# Patient Record
Sex: Female | Born: 1985 | Race: Black or African American | Hispanic: No | Marital: Single | Smoking: Never smoker
Health system: Southern US, Community
[De-identification: ages and names within clinical notes are randomized; demographics above are authoritative.]

## PROBLEM LIST (undated history)

## (undated) DIAGNOSIS — F32A Depression, unspecified: Secondary | ICD-10-CM

## (undated) DIAGNOSIS — F329 Major depressive disorder, single episode, unspecified: Secondary | ICD-10-CM

## (undated) DIAGNOSIS — J45909 Unspecified asthma, uncomplicated: Secondary | ICD-10-CM

## (undated) HISTORY — DX: Unspecified asthma, uncomplicated: J45.909

## (undated) HISTORY — DX: Depression, unspecified: F32.A

## (undated) HISTORY — DX: Major depressive disorder, single episode, unspecified: F32.9

---

## 2005-02-04 ENCOUNTER — Inpatient Hospital Stay (HOSPITAL_COMMUNITY): Admission: AD | Admit: 2005-02-04 | Discharge: 2005-02-04 | Payer: Self-pay | Admitting: Obstetrics & Gynecology

## 2005-02-06 ENCOUNTER — Inpatient Hospital Stay (HOSPITAL_COMMUNITY): Admission: AD | Admit: 2005-02-06 | Discharge: 2005-02-06 | Payer: Self-pay | Admitting: *Deleted

## 2005-09-14 ENCOUNTER — Inpatient Hospital Stay (HOSPITAL_COMMUNITY): Admission: AD | Admit: 2005-09-14 | Discharge: 2005-09-14 | Payer: Self-pay | Admitting: Family Medicine

## 2005-10-10 ENCOUNTER — Inpatient Hospital Stay (HOSPITAL_COMMUNITY): Admission: AD | Admit: 2005-10-10 | Discharge: 2005-10-10 | Payer: Self-pay | Admitting: Obstetrics & Gynecology

## 2005-10-16 ENCOUNTER — Ambulatory Visit (HOSPITAL_COMMUNITY): Admission: RE | Admit: 2005-10-16 | Discharge: 2005-10-16 | Payer: Self-pay | Admitting: Family Medicine

## 2005-11-13 ENCOUNTER — Ambulatory Visit (HOSPITAL_COMMUNITY): Admission: RE | Admit: 2005-11-13 | Discharge: 2005-11-13 | Payer: Self-pay | Admitting: Obstetrics and Gynecology

## 2005-12-04 ENCOUNTER — Inpatient Hospital Stay (HOSPITAL_COMMUNITY): Admission: AD | Admit: 2005-12-04 | Discharge: 2005-12-04 | Payer: Self-pay | Admitting: Family Medicine

## 2005-12-05 ENCOUNTER — Ambulatory Visit: Payer: Self-pay | Admitting: Gynecology

## 2005-12-05 ENCOUNTER — Inpatient Hospital Stay (HOSPITAL_COMMUNITY): Admission: AD | Admit: 2005-12-05 | Discharge: 2005-12-06 | Payer: Self-pay | Admitting: Gynecology

## 2006-01-04 ENCOUNTER — Ambulatory Visit (HOSPITAL_COMMUNITY): Admission: RE | Admit: 2006-01-04 | Discharge: 2006-01-04 | Payer: Self-pay | Admitting: Obstetrics & Gynecology

## 2006-03-19 ENCOUNTER — Inpatient Hospital Stay (HOSPITAL_COMMUNITY): Admission: AD | Admit: 2006-03-19 | Discharge: 2006-03-19 | Payer: Self-pay | Admitting: Obstetrics & Gynecology

## 2006-04-17 ENCOUNTER — Inpatient Hospital Stay (HOSPITAL_COMMUNITY): Admission: AD | Admit: 2006-04-17 | Discharge: 2006-04-19 | Payer: Self-pay | Admitting: Obstetrics and Gynecology

## 2012-04-23 DIAGNOSIS — Z87898 Personal history of other specified conditions: Secondary | ICD-10-CM | POA: Insufficient documentation

## 2013-01-01 HISTORY — PX: PARTIAL HYSTERECTOMY: SHX80

## 2017-01-19 DIAGNOSIS — R509 Fever, unspecified: Secondary | ICD-10-CM | POA: Diagnosis not present

## 2017-01-19 DIAGNOSIS — J329 Chronic sinusitis, unspecified: Secondary | ICD-10-CM | POA: Diagnosis not present

## 2017-01-19 DIAGNOSIS — J029 Acute pharyngitis, unspecified: Secondary | ICD-10-CM | POA: Diagnosis not present

## 2017-06-08 DIAGNOSIS — S00511A Abrasion of lip, initial encounter: Secondary | ICD-10-CM | POA: Diagnosis not present

## 2017-06-08 DIAGNOSIS — S0993XA Unspecified injury of face, initial encounter: Secondary | ICD-10-CM | POA: Diagnosis not present

## 2017-06-08 DIAGNOSIS — S0990XA Unspecified injury of head, initial encounter: Secondary | ICD-10-CM | POA: Diagnosis not present

## 2017-06-08 DIAGNOSIS — S0081XA Abrasion of other part of head, initial encounter: Secondary | ICD-10-CM | POA: Diagnosis not present

## 2017-06-08 DIAGNOSIS — F10129 Alcohol abuse with intoxication, unspecified: Secondary | ICD-10-CM | POA: Diagnosis not present

## 2017-06-08 DIAGNOSIS — T07XXXA Unspecified multiple injuries, initial encounter: Secondary | ICD-10-CM | POA: Diagnosis not present

## 2017-06-09 DIAGNOSIS — T148XXA Other injury of unspecified body region, initial encounter: Secondary | ICD-10-CM | POA: Diagnosis not present

## 2017-06-14 ENCOUNTER — Ambulatory Visit (INDEPENDENT_AMBULATORY_CARE_PROVIDER_SITE_OTHER): Payer: 59 | Admitting: Family Medicine

## 2017-06-14 ENCOUNTER — Encounter: Payer: Self-pay | Admitting: Family Medicine

## 2017-06-14 VITALS — BP 96/66 | HR 84 | Temp 98.5°F | Ht 67.0 in | Wt 185.6 lb

## 2017-06-14 DIAGNOSIS — Z0000 Encounter for general adult medical examination without abnormal findings: Secondary | ICD-10-CM

## 2017-06-14 DIAGNOSIS — R1013 Epigastric pain: Secondary | ICD-10-CM | POA: Diagnosis not present

## 2017-06-14 DIAGNOSIS — Z114 Encounter for screening for human immunodeficiency virus [HIV]: Secondary | ICD-10-CM | POA: Diagnosis not present

## 2017-06-14 DIAGNOSIS — Z23 Encounter for immunization: Secondary | ICD-10-CM | POA: Diagnosis not present

## 2017-06-14 LAB — COMPREHENSIVE METABOLIC PANEL
ALBUMIN: 4.4 g/dL (ref 3.5–5.2)
ALT: 9 U/L (ref 0–35)
AST: 14 U/L (ref 0–37)
Alkaline Phosphatase: 58 U/L (ref 39–117)
BUN: 9 mg/dL (ref 6–23)
CHLORIDE: 101 meq/L (ref 96–112)
CO2: 28 mEq/L (ref 19–32)
CREATININE: 1.07 mg/dL (ref 0.40–1.20)
Calcium: 9.8 mg/dL (ref 8.4–10.5)
GFR: 75.99 mL/min (ref >60.00)
GLUCOSE: 96 mg/dL (ref 70–99)
POTASSIUM: 4 meq/L (ref 3.5–5.1)
SODIUM: 137 meq/L (ref 135–145)
TOTAL PROTEIN: 8.1 g/dL (ref 6.0–8.3)
Total Bilirubin: 0.6 mg/dL (ref 0.2–1.2)

## 2017-06-14 LAB — CBC WITH DIFFERENTIAL/PLATELET
BASOS PCT: 0.7 % (ref 0.0–3.0)
Basophils Absolute: 0.1 10*3/uL (ref 0.0–0.1)
EOS ABS: 0.2 10*3/uL (ref 0.0–0.7)
EOS PCT: 2 % (ref 0.0–5.0)
HCT: 39.9 % (ref 36.0–46.0)
HEMOGLOBIN: 13.4 g/dL (ref 12.0–15.0)
Lymphocytes Relative: 21.7 % (ref 12.0–46.0)
Lymphs Abs: 2.2 10*3/uL (ref 0.7–4.0)
MCHC: 33.6 g/dL (ref 30.0–36.0)
MCV: 89.1 fl (ref 78.0–100.0)
MONO ABS: 0.6 10*3/uL (ref 0.1–1.0)
Monocytes Relative: 6.3 % (ref 3.0–12.0)
NEUTROS ABS: 6.9 10*3/uL (ref 1.4–7.7)
Neutrophils Relative %: 69.3 % (ref 43.0–77.0)
PLATELETS: 309 10*3/uL (ref 150.0–400.0)
RBC: 4.47 Mil/uL (ref 3.87–5.11)
RDW: 14.4 % (ref 11.5–15.5)
WBC: 10 10*3/uL (ref 4.0–10.5)

## 2017-06-14 LAB — LIPID PANEL
CHOL/HDL RATIO: 3
CHOLESTEROL: 176 mg/dL (ref 0–200)
HDL: 52.5 mg/dL (ref >39.00)
LDL CALC: 104 mg/dL — AB (ref 0–99)
NonHDL: 123.07
TRIGLYCERIDES: 94 mg/dL (ref 0.0–149.0)
VLDL: 18.8 mg/dL (ref 0.0–40.0)

## 2017-06-14 LAB — LIPASE: Lipase: 22 U/L (ref 11.0–59.0)

## 2017-06-14 LAB — TSH: TSH: 1.39 u[IU]/mL (ref 0.35–4.50)

## 2017-06-14 MED ORDER — PANTOPRAZOLE SODIUM 40 MG PO TBEC: 40.0000 mg | DELAYED_RELEASE_TABLET | Freq: Every day | ORAL | 3 refills | Status: DC
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
## 2017-06-14 NOTE — Patient Instructions (Signed)
Gastritis, Adult Gastritis is swelling (inflammation) of the stomach. When you have this condition, you can have these problems (symptoms):  Pain in your stomach.  A burning feeling in your stomach.  Feeling sick to your stomach (nauseous).  Throwing up (vomiting).  Feeling too full after you eat.  It is important to get help for this condition. Without help, your stomach can bleed, and you can get sores (ulcers) in your stomach. Follow these instructions at home:  Take over-the-counter and prescription medicines only as told by your doctor.  If you were prescribed an antibiotic medicine, take it as told by your doctor. Do not stop taking it even if you start to feel better.  Drink enough fluid to keep your pee (urine) clear or pale yellow.  Instead of eating big meals, eat small meals often. Contact a health care provider if:  Your problems get worse.  Your problems go away and then come back. Get help right away if:  You throw up blood or something that looks like coffee grounds.  You have black or dark red poop (stools).  You cannot keep fluids down.  Your stomach pain gets worse.  You have a fever.  You do not feel better after 1 week. This information is not intended to replace advice given to you by your health care provider. Make sure you discuss any questions you have with your health care provider. Document Released: 06/06/2007 Document Revised: 08/17/2015 Document Reviewed: 09/11/2014 Elsevier Interactive Patient Education  2018 [employer]. Mallory-Weiss Syndrome Mallory-Weiss syndrome refers to bleeding from tears in the lining of the tube that connects your throat to your stomach (esophagus). The tears occur at the entrance to your stomach. Usually the bleeding stops by itself after 24-48 hours. Surgery may be needed. This condition is not usually fatal. What are the causes? The tears that cause bleeding are often caused by severe or lasting vomiting or  coughing. What increases the risk?  Abusing or drinking too much alcohol.  Having certain eating disorders, such as bulimia. What are the signs or symptoms?  Vomiting of bright red or black coffee-ground-like material.  Having black, tarry stools.  Fainting or experiencing loss of consciousness. How is this diagnosed? The procedure often used to diagnose Mallory-Weiss syndrome is an esophagogastroduodenoscopy (EGD). During an EGD procedure, a small, flexible, tube-like telescope (endoscope) is put into your mouth, passed through your esophagus into your stomach, and then into your small bowel. An EGD will show your health care provider where the tear is. How is this treated? It is necessary to stop the bleeding as soon as possible. The possible treatments to stop the bleeding include injecting medicine into bleeding areas to block (clot) the blood vessels. If bleeding is significant, it may be necessary to replace the blood lost with a blood transfusion. Follow these instructions at home: Treat any conditions that may be causing the lasting or recurrent vomiting or coughing. This may include getting help for alcoholism or an eating disorder, if this applies. Contact a health care provider if: You have nausea or vomiting. Get help right away if:  You have persistent dizziness, light-headedness, or fainting.  Your vomiting returns, or you have vomit that is bright red or looks like black coffee grounds.  You have bloody or black, tarry-looking stools.  You have chest pain.  You cannot eat or drink. This information is not intended to replace advice given to you by your health care provider. Make sure you discuss any questions  you have with your health care provider. Document Released: 05/07/2005 Document Revised: 05/26/2015 Document Reviewed: 02/11/2013 Elsevier Interactive Patient Education  [employer]2018 Elsevier Inc.

## 2017-06-15 LAB — HIV ANTIBODY (ROUTINE TESTING W REFLEX): HIV 1&2 Ab, 4th Generation: NONREACTIVE

## 2017-06-17 LAB — H. PYLORI BREATH TEST: H. pylori Breath Test: NOT DETECTED

## 2017-07-18 ENCOUNTER — Ambulatory Visit: Payer: 59 | Admitting: Family Medicine

## 2017-07-22 NOTE — Progress Notes (Signed)
Patient: Morgan Oneal MRN: [id number] DOB: Dec 17, 1985 PCP: Morgan Oneal, Morgan Mettler, MD     Subjective:  Chief Complaint  Patient presents with  . Follow-up    abdominal pain improving    HPI: The patient is a 32 y.o. female who presents today for follow up abdominal pain-improving. I saw her on 06/14/2017 for abdominal pain and treated her for likely mallory wise tear. I put her on protonix daily. She states she is 100% better and no longer takes the protonix. She will occasionally take it if she eats really spicy foods, other than that she is off and feeling better. NO more n/v. Overall 100% improved.   Review of Systems  Constitutional: Positive for fatigue. Negative for activity change and appetite change.  Respiratory: Negative for shortness of breath.   Cardiovascular: Negative for chest pain.  Gastrointestinal: Negative for abdominal pain and nausea.  Neurological: Positive for headaches. Negative for dizziness.  Psychiatric/Behavioral: The patient is not nervous/anxious.     Allergies Patient is allergic to mucinex [guaifenesin er] and penicillins.  Past Medical History Patient  has a past medical history of Asthma and Depression.  Surgical History Patient  has a past surgical history that includes Partial hysterectomy (2015).  Family History Pateint's family history includes Alcohol abuse in her paternal grandfather; Arthritis in her father, maternal grandmother, and paternal grandmother; Asthma in her paternal grandmother; Cancer in her paternal grandmother; Diabetes in her maternal grandmother.  Social History Patient  reports that she has never smoked. She has never used smokeless tobacco. She reports that she drinks alcohol. She reports that she does not use drugs.    Objective: Vitals:   07/24/17 1101  BP: 104/64  Pulse: 80  Temp: 98.3 F (36.8 C)  TempSrc: Oral  SpO2: 98%  Weight: 186 lb 9.6 oz (84.6 kg)  Height: 5\' 7"  (1.702 m)    Body mass index is 29.23  kg/m.  Physical Exam  Constitutional: She is oriented to person, place, and time. She appears well-developed and well-nourished.  Cardiovascular: Normal rate, regular rhythm, normal heart sounds and intact distal pulses.  Pulmonary/Chest: Effort normal and breath sounds normal.  Abdominal: Soft. Bowel sounds are normal. She exhibits no distension. There is no tenderness.  Neurological: She is alert and oriented to person, place, and time.  Vitals reviewed.      Assessment/plan: 1. Mallory-Weiss tear Doing great and 100% improved. Pretty much off protonix. F/u as needed and she will come back for pap smear.    Return if symptoms worsen or fail to improve, for pap smear .     Morgan MustardAllison Melessa Cowell, MD [facility]   07/24/2017

## 2017-07-24 ENCOUNTER — Encounter: Payer: Self-pay | Admitting: Family Medicine

## 2017-07-24 ENCOUNTER — Ambulatory Visit (INDEPENDENT_AMBULATORY_CARE_PROVIDER_SITE_OTHER): Payer: 59 | Admitting: Family Medicine

## 2017-07-24 VITALS — BP 104/64 | HR 80 | Temp 98.3°F | Ht 67.0 in | Wt 186.6 lb

## 2017-07-24 DIAGNOSIS — K226 Gastro-esophageal laceration-hemorrhage syndrome: Secondary | ICD-10-CM | POA: Diagnosis not present

## 2017-07-24 DIAGNOSIS — I7300 Raynaud's syndrome without gangrene: Secondary | ICD-10-CM | POA: Insufficient documentation

## 2017-08-01 ENCOUNTER — Other Ambulatory Visit (HOSPITAL_COMMUNITY)
Admission: RE | Admit: 2017-08-01 | Discharge: 2017-08-01 | Disposition: A | Discharge status: Home / Self Care | Payer: 59 | Source: Ambulatory Visit | Attending: Family Medicine | Admitting: Family Medicine

## 2017-08-01 ENCOUNTER — Encounter: Payer: Self-pay | Admitting: Family Medicine

## 2017-08-01 ENCOUNTER — Ambulatory Visit (INDEPENDENT_AMBULATORY_CARE_PROVIDER_SITE_OTHER): Payer: 59 | Admitting: Family Medicine

## 2017-08-01 VITALS — BP 126/72 | HR 84 | Temp 98.7°F | Ht 67.0 in | Wt 185.6 lb

## 2017-08-01 DIAGNOSIS — Z01411 Encounter for gynecological examination (general) (routine) with abnormal findings: Secondary | ICD-10-CM | POA: Diagnosis not present

## 2017-08-01 DIAGNOSIS — Z01419 Encounter for gynecological examination (general) (routine) without abnormal findings: Secondary | ICD-10-CM

## 2017-08-01 DIAGNOSIS — N3941 Urge incontinence: Secondary | ICD-10-CM | POA: Diagnosis not present

## 2017-08-01 MED ORDER — MIRABEGRON ER 25 MG PO TB24: 25.0000 mg | ORAL_TABLET | Freq: Every day | ORAL | 1 refills | Status: DC

## 2017-08-01 NOTE — Progress Notes (Signed)
Subjective:    Morgan Oneal is a 32 y.o. female and is here for a well woman exam and pap smear.   Pertinent Gynecological History: No LMP recorded (lmp unknown). Patient has had a hysterectomy. Sexually active: Yes with males. Uses condoms.  Menses: hysterectomy Bleeding: spotting randomly  Contraception: status post hysterectomy DES exposure: unknown Blood transfusions: none Sexually transmitted diseases: no past history Previous GYN Procedures: multiple cones then hysterectomy   Last mammogram: n/a  Last pap: abnormal: 3 years ago  HPV vaccines: no   Menses age: 32 years of age. No breast cancer hx in mom/sisters. G4P3. All vaginal births. No pain with sex, no vaginal itching. She does have discharge all the time. It has no odor to it. She does have slight incontinence. More urge related. No breast complaints. She does use condoms with sex .  OB History   None     Health Maintenance Due  Topic Date Due  . PAP SMEAR  07/16/2005  . INFLUENZA VACCINE  08/01/2017    PMHx, SurgHx, SocialHx, Medications, and Allergies were reviewed in the Visit Navigator and updated as appropriate.   Past Medical History:  Diagnosis Date  . Asthma   . Depression   ER Past Surgical History:  Procedure Laterality Date  . PARTIAL HYSTERECTOMY  2015  ECTOMY  2015    Problem Relation Age of Onset  . Arthritis Father   . Arthritis Maternal Grandmother   . Diabetes Maternal Grandmother   . Arthritis Paternal Grandmother   . Asthma Paternal Grandmother   . Cancer Paternal Grandmother   . Alcohol abuse Paternal Grandfather    Social History   Tobacco Use  . Smoking status: Never Smoker  . Smokeless tobacco: Never Used  Substance Use Topics  . Alcohol use: Yes    Comment: maybe once/week   . Drug use: Never    Review of Systems:   Review of Systems  Constitutional: Negative for chills, fever and malaise/fatigue.  HENT: Negative for hearing loss and sore throat.   Eyes:  Negative for blurred vision and double vision.  Respiratory: Negative for cough, shortness of breath and wheezing.   Cardiovascular: Negative for chest pain, palpitations and leg swelling.  Gastrointestinal: Negative for abdominal pain, blood in stool, nausea and vomiting.  Genitourinary: Positive for urgency. Negative for dysuria and hematuria.  Musculoskeletal: Negative for falls.  Skin: Negative for rash.  Neurological: Negative for dizziness and weakness. Loss of consciousness: .diag.  Psychiatric/Behavioral: Negative for memory loss and suicidal ideas. The patient is not nervous/anxious and does not have insomnia.       Objective:   BP 126/72 (BP Location: Left Arm, Patient Position: Sitting, Cuff Size: Normal)   Pulse 84   Temp 98.7 F (37.1 C) (Oral)   Ht 5\' 7"  (1.702 m)   Wt 185 lb 9.6 oz (84.2 kg)   LMP  (LMP Unknown)   SpO2 98%   BMI 29.07 kg/m    Wt Readings from Last 3 Encounters:  08/01/17 185 lb 9.6 oz (84.2 kg)  07/24/17 186 lb 9.6 oz (84.6 kg)  06/14/17 185 lb 9.6 oz (84.2 kg)     Ht Readings from Last 3 Encounters:  08/01/17 5\' 7"  (1.702 m)  07/24/17 5\' 7"  (1.702 m)  06/14/17 5\' 7"  (1.702 m)    General appearance: alert, cooperative and appears stated age. Head: normocephalic, without obvious abnormality, atraumatic. Neck: no adenopathy, supple, symmetrical, trachea midline; thyroid not enlarged, symmetric, no tenderness/mass/nodules. Lungs:  clear to auscultation bilaterally. Breasts: inspection negative, no nipple retraction or dimpling, no nipple discharge or bleeding, no axillary or supraclavicular adenopathy, normal to palpation without dominant masses. Heart: regular rate and rhythm Abdomen: soft, non-tender; no masses,  no organomegaly. Extremities: extremities normal, atraumatic, no cyanosis or edema. Skin: skin color, texture, turgor normal, no rashes or lesions. Lymph: cervical, supraclavicular, and axillary nodes normal; no abnormal inguinal  nodes palpated. Neurologic: grossly normal.  Pelvic:  External genitalia: no lesions.              Urethra: normal appearing urethra with no masses, tenderness or lesions.              Bartholins and Skenes: normal.               Vagina: normal appearing vagina with normal color and discharge, no lesions.              Cervix: absent. Vaginal cuff.               Pap and high risk HPV testing done: Yes.  .        Bimanual Exam:                                         Adnexa: normal adnexa in size, nontender and no masses.                                      Anus: normal sphincter tone, no lesions.   Assessment/Plan:   1. Encounter for well woman exam with routine gynecological exam Pap and cytology for gc/BV/yeast. Will call insurance to see about getting HPV vaccines. Already  Had annual with labs/physical. Continue practicing safe sex.  - Cytology - PAP  Patient Counseling: [x]    Nutrition: Stressed importance of moderation in sodium/caffeine intake, saturated fat and cholesterol, caloric balance, sufficient intake of fresh fruits, vegetables, fiber, calcium, iron, and 1 mg of folate supplement per day (for females capable of pregnancy).  [x]    Stressed the importance of regular exercise.   [x]    Substance Abuse: Discussed cessation/primary prevention of tobacco, alcohol, or other drug use; driving or other dangerous activities under the influence; availability of treatment for abuse.   [x]    Injury prevention: Discussed safety belts, safety helmets, smoke detector, smoking near bedding or upholstery.   [x]    Sexuality: Discussed sexually transmitted diseases, partner selection, use of condoms, avoidance of unintended pregnancy  and contraceptive alternatives.  [x]    Dental health: Discussed importance of regular tooth brushing, flossing, and dental visits.  [x]    Health maintenance and immunizations reviewed. Please refer to Health maintenance section.   2. Urge incontinence Trial of  myrebetriq. Will let me know if too expensive. Discussed takes a month to work and can increase dose if we need to. Already does not drink caffeine and practices kegles and bladder training. Hopefully addition of medication will help her.   [name], MD  [facility]

## 2017-08-01 NOTE — Patient Instructions (Signed)
Urinary Incontinence Urinary incontinence is the involuntary loss of urine from your bladder. What are the causes? There are many causes of urinary incontinence. They include:  Medicines.  Infections.  Prostatic enlargement, leading to overflow of urine from your bladder.  Surgery.  Neurological diseases.  Emotional factors.  What are the signs or symptoms? Urinary Incontinence can be divided into four types: 1. Urge incontinence. Urge incontinence is the involuntary loss of urine before you have the opportunity to go to the bathroom. There is a sudden urge to void but not enough time to reach a bathroom. 2. Stress incontinence. Stress incontinence is the sudden loss of urine with any activity that forces urine to pass. It is commonly caused by anatomical changes to the pelvis and sphincter areas of your body. 3. Overflow incontinence. Overflow incontinence is the loss of urine from an obstructed opening to your bladder. This results in a backup of urine and a resultant buildup of pressure within the bladder. When the pressure within the bladder exceeds the closing pressure of the sphincter, the urine overflows, which causes incontinence, similar to water overflowing a dam. 4. Total incontinence. Total incontinence is the loss of urine as a result of the inability to store urine within your bladder.  How is this diagnosed? Evaluating the cause of incontinence may require:  A thorough and complete medical and obstetric history.  A complete physical exam.  Laboratory tests such as a urine culture and sensitivities.  When additional tests are indicated, they can include:  An ultrasound exam.  Kidney and bladder X-rays.  Cystoscopy. This is an exam of the bladder using a narrow scope.  Urodynamic testing to test the nerve function to the bladder and sphincter areas.  How is this treated? Treatment for urinary incontinence depends on the cause:  For urge incontinence caused  by a bacterial infection, antibiotics will be prescribed. If the urge incontinence is related to medicines you take, your health care provider may have you change the medicine.  For stress incontinence, surgery to re-establish anatomical support to the bladder or sphincter, or both, will often correct the condition.  For overflow incontinence caused by an enlarged prostate, an operation to open the channel through the enlarged prostate will allow the flow of urine out of the bladder. In women with fibroids, a hysterectomy may be recommended.  For total incontinence, surgery on your urinary sphincter may help. An artificial urinary sphincter (an inflatable cuff placed around the urethra) may be required. In women who have developed a hole-like passage between their bladder and vagina (vesicovaginal fistula), surgery to close the fistula often is required.  Follow these instructions at home:  Normal daily hygiene and the use of pads or adult diapers that are changed regularly will help prevent odors and skin damage.  Avoid caffeine. It can overstimulate your bladder.  Use the bathroom regularly. Try about every 2-3 hours to go to the bathroom, even if you do not feel the need to do so. Take time to empty your bladder completely. After urinating, wait a minute. Then try to urinate again.  For causes involving nerve dysfunction, keep a log of the medicines you take and a journal of the times you go to the bathroom. Contact a health care provider if:  You experience worsening of pain instead of improvement in pain after your procedure.  Your incontinence becomes worse instead of better. Get help right away if:  You experience fever or shaking chills.  You are unable to   pass your urine.  You have redness spreading into your groin or down into your thighs. This information is not intended to replace advice given to you by your health care provider. Make sure you discuss any questions you have  with your health care provider. Document Released: 01/26/2004 Document Revised: 07/29/2015 Document Reviewed: 05/27/2012 Elsevier Interactive Patient Education  2018 [employer] Inc.  

## 2017-08-01 NOTE — Progress Notes (Deleted)
SUBJECTIVE:  32 y.o. female for annual routine Pap and checkup. Current Outpatient Medications  Medication Sig Dispense Refill  . loratadine (CLARITIN) 10 MG tablet Take 10 mg by mouth daily.    . montelukast (SINGULAIR) 10 MG tablet Take 10 mg by mouth at bedtime.    . Multiple Vitamin (MULTIVITAMIN) tablet Take 1 tablet by mouth daily.    . pantoprazole (PROTONIX) 40 MG tablet Take 1 tablet (40 mg total) by mouth daily. 30 tablet 3   No current facility-administered medications for this visit.    Allergies: Mucinex [guaifenesin er] and Penicillins  No LMP recorded (lmp unknown). Patient has had a hysterectomy.  ROS:  Feeling well. No dyspnea or chest pain on exertion.  No abdominal pain, change in bowel habits, black or bloody stools.  No urinary tract symptoms. GYN ROS: {gyn ros:315267::"normal menses, no abnormal bleeding, pelvic pain or discharge","no breast pain or new or enlarging lumps on self exam"}. No neurological complaints.  OBJECTIVE:  The patient appears well, alert, oriented x 3, in no distress. LMP  (LMP Unknown)  ENT normal.  Neck supple. No adenopathy or thyromegaly. PERLA. Lungs are clear, good air entry, no wheezes, rhonchi or rales. S1 and S2 normal, no murmurs, regular rate and rhythm. Abdomen soft without tenderness, guarding, mass or organomegaly. Extremities show no edema, normal peripheral pulses. Neurological is normal, no focal findings.  BREAST EXAM: {pe breast exam:315056::"breasts appear normal, no suspicious masses, no skin or nipple changes or axillary nodes"}  PELVIC EXAM: {pelvic exam:315900::"normal external genitalia, vulva, vagina, cervix, uterus and adnexa"}  ASSESSMENT:  {gyn assessment:315268::"well woman"}  PLAN:  {gyn plan:315269::"mammogram","pap smear","return annually or prn"}

## 2017-08-06 ENCOUNTER — Telehealth: Payer: Self-pay | Admitting: Family Medicine

## 2017-08-06 ENCOUNTER — Encounter: Payer: Self-pay | Admitting: Family Medicine

## 2017-08-06 DIAGNOSIS — A749 Chlamydial infection, unspecified: Secondary | ICD-10-CM

## 2017-08-06 LAB — CYTOLOGY - PAP
CANDIDA VAGINITIS: NEGATIVE
Chlamydia: POSITIVE — AB
Diagnosis: NEGATIVE
HPV: NOT DETECTED
Neisseria Gonorrhea: NEGATIVE
Trichomonas: POSITIVE

## 2017-08-06 MED ORDER — METRONIDAZOLE 500 MG PO TABS: ORAL_TABLET | ORAL | 0 refills | Status: DC

## 2017-08-06 MED ORDER — AZITHROMYCIN 500 MG PO TABS: 1000.0000 mg | ORAL_TABLET | Freq: Every day | ORAL | 0 refills | Status: DC

## 2017-08-06 NOTE — Telephone Encounter (Signed)
Please let her know the following.Marland Kitchen. 1) she has both chlamydia and trichomonas. Needs to make sure she tells her partners. Will need 2 different medications to treat this and will also need test of cure urine about 2 weeks after she takes medication. I sent both into pharmacy. Can not drink alcohol with medication for trich. Needs to have lab only in 2 weeks.   Her pap is normal. Waiting on hpv to come back.

## 2017-08-06 NOTE — Telephone Encounter (Signed)
Called and left voicemail msg on pt's cell phone for her to return my call.

## 2017-08-07 NOTE — Telephone Encounter (Signed)
See note.  Pt given lab results

## 2017-08-07 NOTE — Telephone Encounter (Signed)
See note

## 2017-08-07 NOTE — Telephone Encounter (Signed)
Patient notified of her results- lab appointment scheduled for test of cure. ? [facility]     Lab not in triage results

## 2017-08-26 ENCOUNTER — Other Ambulatory Visit: Payer: 59

## 2017-08-26 ENCOUNTER — Other Ambulatory Visit (HOSPITAL_COMMUNITY)
Admission: RE | Admit: 2017-08-26 | Discharge: 2017-08-26 | Disposition: A | Discharge status: Home / Self Care | Payer: 59 | Source: Ambulatory Visit | Attending: Family Medicine | Admitting: Family Medicine

## 2017-08-26 DIAGNOSIS — A749 Chlamydial infection, unspecified: Secondary | ICD-10-CM | POA: Diagnosis not present

## 2017-08-27 LAB — URINE CYTOLOGY ANCILLARY ONLY
Chlamydia: POSITIVE — AB
Neisseria Gonorrhea: NEGATIVE
Trichomonas: NEGATIVE

## 2017-08-29 ENCOUNTER — Other Ambulatory Visit: Payer: Self-pay

## 2017-08-29 ENCOUNTER — Other Ambulatory Visit: Payer: Self-pay | Admitting: Family Medicine

## 2017-08-29 ENCOUNTER — Encounter: Payer: Self-pay | Admitting: Family Medicine

## 2017-08-29 DIAGNOSIS — A749 Chlamydial infection, unspecified: Secondary | ICD-10-CM

## 2017-08-29 MED ORDER — AZITHROMYCIN 500 MG PO TABS: ORAL_TABLET | ORAL | 0 refills | Status: DC

## 2017-08-30 ENCOUNTER — Encounter: Payer: Self-pay | Admitting: Family Medicine

## 2017-09-04 ENCOUNTER — Encounter: Payer: Self-pay | Admitting: Family Medicine

## 2017-09-05 ENCOUNTER — Encounter: Payer: Self-pay | Admitting: Family Medicine

## 2017-09-18 ENCOUNTER — Other Ambulatory Visit: Payer: 59

## 2017-09-27 ENCOUNTER — Other Ambulatory Visit: Payer: 59

## 2017-09-27 DIAGNOSIS — R8279 Other abnormal findings on microbiological examination of urine: Secondary | ICD-10-CM | POA: Diagnosis not present

## 2017-09-27 DIAGNOSIS — R300 Dysuria: Secondary | ICD-10-CM | POA: Diagnosis not present

## 2017-09-27 DIAGNOSIS — Z202 Contact with and (suspected) exposure to infections with a predominantly sexual mode of transmission: Secondary | ICD-10-CM | POA: Diagnosis not present

## 2017-09-27 DIAGNOSIS — R350 Frequency of micturition: Secondary | ICD-10-CM | POA: Diagnosis not present

## 2017-10-09 ENCOUNTER — Ambulatory Visit (INDEPENDENT_AMBULATORY_CARE_PROVIDER_SITE_OTHER): Payer: 59

## 2017-10-09 DIAGNOSIS — Z111 Encounter for screening for respiratory tuberculosis: Secondary | ICD-10-CM

## 2017-10-09 NOTE — Progress Notes (Signed)
Patient in today for PPD placement. Administered to left fore arm. Patient tolerated well. Patient will return on Friday to have PPD read.

## 2017-10-09 NOTE — Patient Instructions (Signed)
There are no preventive care reminders to display for this patient.  No flowsheet data found.  

## 2017-10-11 ENCOUNTER — Ambulatory Visit (INDEPENDENT_AMBULATORY_CARE_PROVIDER_SITE_OTHER): Payer: 59

## 2017-10-11 DIAGNOSIS — R7611 Nonspecific reaction to tuberculin skin test without active tuberculosis: Secondary | ICD-10-CM | POA: Diagnosis not present

## 2017-10-11 LAB — TB SKIN TEST
Induration: 24 mm
TB Skin Test: POSITIVE

## 2017-10-11 NOTE — Progress Notes (Signed)
Patient in today for PPD read. 24mm induration on left forearm. Dr. Artis Flock notified. Chest xray ordered. Explained to patient that xray would have to be read and likely wouldn't have results until Monday. Patient verbalized understanding.

## 2017-10-11 NOTE — Patient Instructions (Signed)
There are no preventive care reminders to display for this patient.  No flowsheet data found.  

## 2017-10-18 ENCOUNTER — Ambulatory Visit: Payer: 59

## 2017-10-21 ENCOUNTER — Ambulatory Visit: Payer: 59

## 2017-10-31 ENCOUNTER — Ambulatory Visit: Payer: 59 | Admitting: Family Medicine

## 2017-11-01 ENCOUNTER — Encounter: Payer: Self-pay | Admitting: Family Medicine

## 2017-11-01 ENCOUNTER — Other Ambulatory Visit (HOSPITAL_COMMUNITY)
Admission: RE | Admit: 2017-11-01 | Discharge: 2017-11-01 | Disposition: A | Discharge status: Home / Self Care | Payer: 59 | Source: Ambulatory Visit | Attending: Family Medicine | Admitting: Family Medicine

## 2017-11-01 ENCOUNTER — Ambulatory Visit (INDEPENDENT_AMBULATORY_CARE_PROVIDER_SITE_OTHER): Payer: 59 | Admitting: Family Medicine

## 2017-11-01 VITALS — BP 122/70 | HR 77 | Temp 97.6°F | Ht 67.0 in | Wt 183.2 lb

## 2017-11-01 DIAGNOSIS — A749 Chlamydial infection, unspecified: Secondary | ICD-10-CM | POA: Diagnosis not present

## 2017-11-01 MED ORDER — AZITHROMYCIN 500 MG PO TABS
ORAL_TABLET | ORAL | 0 refills | Status: DC
Start: 2017-11-01 — End: 2018-01-30

## 2017-11-01 NOTE — Addendum Note (Signed)
Addended by: London Sheer T on: 11/01/2017 11:43 AM   Modules accepted: Orders

## 2017-11-01 NOTE — Progress Notes (Signed)
Patient: Morgan Oneal MRN: [id number] DOB: 1985/11/17 PCP: [name], MD     Subjective:  Chief Complaint  Patient presents with  . TOC for chlamydia    HPI: The patient is a 32 y.o. female who presents today for TOC for chlamydia. I have treated her twice as her first TOC was positive. She just found out that her partner never got tested and never got treated and he was unfaithful. She was with him for 2 years. She is having symptoms today. She is having a yellow vaginal discharge. No vaginal pain, no adnexal pain and no pain with sex. Last time she had sex with the ex boyfriend was a month or longer ago. She has had no fevers. She has no abdominal pain.   Review of Systems  Constitutional: Positive for fatigue. Negative for chills and fever.  Genitourinary: Positive for vaginal discharge. Negative for dyspareunia, dysuria, flank pain, frequency, pelvic pain and vaginal pain.  Musculoskeletal: Positive for back pain.    Allergies Patient is allergic to mucinex [guaifenesin er] and penicillins.  Past Medical History Patient  has a past medical history of Asthma and Depression.  Surgical History Patient  has a past surgical history that includes Partial hysterectomy (2015).  Family History Pateint's family history includes Alcohol abuse in her paternal grandfather; Arthritis in her father, maternal grandmother, and paternal grandmother; Asthma in her paternal grandmother; Cancer in her paternal grandmother; Diabetes in her maternal grandmother.  Social History Patient  reports that she has never smoked. She has never used smokeless tobacco. She reports that she drinks alcohol. She reports that she does not use drugs.    Objective: Vitals:   11/01/17 0918  BP: 122/70  Pulse: 77  Temp: 97.6 F (36.4 C)  TempSrc: Oral  SpO2: 99%  Weight: 183 lb 3.2 oz (83.1 kg)  Height: 5\' 7"  (1.702 m)    Body mass index is 28.69 kg/m.  Physical Exam  Constitutional: She  appears well-developed and well-nourished.  Neck: Normal range of motion. Neck supple.  Cardiovascular: Normal rate, regular rhythm and normal heart sounds.  Pulmonary/Chest: Effort normal and breath sounds normal.  Abdominal: Soft. Bowel sounds are normal. There is no tenderness.  Vitals reviewed.      Assessment/plan: 1. Chlamydia infection TOC again today and Im going to go ahead and treat her as she likely is still infected. Recommended HIV testing as well, but she declined. No longer seeing boyfriend. If + again would still do TOC on her. PID precautions given.  - Urine cytology ancillary only; Future   Return if symptoms worsen or fail to improve.   [name], MD [facility]   11/01/2017

## 2017-11-04 LAB — URINE CYTOLOGY ANCILLARY ONLY
Chlamydia: NEGATIVE
NEISSERIA GONORRHEA: NEGATIVE

## 2018-01-14 ENCOUNTER — Encounter: Payer: Self-pay | Admitting: Family Medicine

## 2018-01-29 ENCOUNTER — Ambulatory Visit: Payer: 59 | Admitting: Family Medicine

## 2018-01-30 ENCOUNTER — Ambulatory Visit (INDEPENDENT_AMBULATORY_CARE_PROVIDER_SITE_OTHER): Payer: 59 | Admitting: Family Medicine

## 2018-01-30 ENCOUNTER — Ambulatory Visit: Payer: 59 | Admitting: Family Medicine

## 2018-01-30 ENCOUNTER — Encounter: Payer: Self-pay | Admitting: Family Medicine

## 2018-01-30 VITALS — BP 100/64 | HR 77 | Temp 98.2°F | Ht 67.0 in | Wt 180.8 lb

## 2018-01-30 DIAGNOSIS — M25561 Pain in right knee: Secondary | ICD-10-CM | POA: Diagnosis not present

## 2018-01-30 DIAGNOSIS — R202 Paresthesia of skin: Secondary | ICD-10-CM | POA: Diagnosis not present

## 2018-01-30 DIAGNOSIS — G8929 Other chronic pain: Secondary | ICD-10-CM | POA: Diagnosis not present

## 2018-01-30 NOTE — Progress Notes (Signed)
Patient: Morgan Oneal MRN: [id number] DOB: 29-Feb-1985 PCP: [name], MD     Subjective:  Chief Complaint  Patient presents with  . 6 mo follow up    HPI: The patient is a 33 y.o. female who presents today for complaints of tingling or like her arms are asleep if she bends her arms like she is holding her phone or bending her elbows. She states the tingling will stay in her hand and will last until she straightens out her arm and works the fingers.  She states symptoms will start quickly anytime it's bent. She also has these symptoms in her feet if she bends her knee. Her feet go purple, get cold and hurt and tingle. All of this started a few months to a year ago. She thinks it may be getting worse. No weakness in any of her extremities. No joint pain except chronic right knee pain, no rashes. +FH of RA in her father. No Lupus or other autoimmune disease in family that she is aware of.    She also has right knee pain. History of scoped in 2015. She injured it in high school and the heavier she got in pregnancy the more it bothered her. She went to ortho after pregnancy and had it scoped. It took 1.5 years to squat/bend and now that she can do that she has a lot of crepitus. It never has been pain free.she denies any recent trauma, but the pain is bothering her. She feels like something is catching in her knee. Nothing makes it better including NSAIDs, heating pad, exercise. She can exercise. Hyperextending makes it worse. It feels like it's going to catch. It does feel unsteady at times. No swelling or redness.   Review of Systems  Constitutional: Positive for fatigue. Negative for fever and unexpected weight change.  Eyes: Negative for visual disturbance.  Respiratory: Negative for cough and shortness of breath.   Cardiovascular: Negative for chest pain.  Gastrointestinal: Negative for abdominal pain and nausea.  Musculoskeletal: Positive for arthralgias. Negative for back pain,  myalgias and neck pain.       C/o right knee pain  Skin: Negative.   Neurological: Negative for dizziness, weakness, numbness and headaches.       Tingling in b/l extremities when leg/arms are bent.   Psychiatric/Behavioral: Positive for sleep disturbance. The patient is not nervous/anxious.     Allergies Patient is allergic to mucinex [guaifenesin er] and penicillins.  Past Medical History Patient  has a past medical history of Asthma and Depression.  Surgical History Patient  has a past surgical history that includes Partial hysterectomy (2015).  Family History Pateint's family history includes Alcohol abuse in her paternal grandfather; Arthritis in her father, maternal grandmother, and paternal grandmother; Asthma in her paternal grandmother; Cancer in her paternal grandmother; Diabetes in her maternal grandmother.  Social History Patient  reports that she has never smoked. She has never used smokeless tobacco. She reports current alcohol use. She reports that she does not use drugs.    Objective: Vitals:   01/30/18 1452  BP: 100/64  Pulse: 77  Temp: 98.2 F (36.8 C)  TempSrc: Oral  SpO2: 99%  Weight: 180 lb 12.8 oz (82 kg)  Height: 5\' 7"  (1.702 m)    Body mass index is 28.32 kg/m.  Physical Exam Vitals signs reviewed.  Constitutional:      Appearance: Normal appearance.  Eyes:     Extraocular Movements: Extraocular movements intact.     Pupils:  Pupils are equal, round, and reactive to light.  Neck:     Musculoskeletal: Normal range of motion and neck supple.  Cardiovascular:     Rate and Rhythm: Normal rate and regular rhythm.     Heart sounds: Normal heart sounds.  Pulmonary:     Effort: Pulmonary effort is normal.     Breath sounds: Normal breath sounds.  Abdominal:     General: Abdomen is flat. Bowel sounds are normal.     Palpations: Abdomen is soft.  Musculoskeletal:        General: Tenderness (right knee: +crepitus. full extension to 180 degrees  present and full flexion to 90 degrees. she does have pain on lateral aspect of knee with varus strain and pain on medial knee with valgus strain. negative draw sign) present. No swelling or deformity.  Lymphadenopathy:     Cervical: No cervical adenopathy.  Skin:    General: Skin is warm.     Capillary Refill: Capillary refill takes less than 2 seconds.     Findings: No rash.  Neurological:     General: No focal deficit present.     Mental Status: She is alert and oriented to person, place, and time.     Cranial Nerves: No cranial nerve deficit.     Sensory: No sensory deficit.     Motor: No weakness.     Deep Tendon Reflexes: Reflexes normal.     Comments: Negative tinels/phalens   Psychiatric:        Mood and Affect: Mood normal.        Behavior: Behavior normal.       Assessment/plan: 1. Tingling in extremities ?neuropathy. Has known raynauds so will check ANA as well as other labs. If labs normal will likely send to neuro for nerve testing and see if she wants to do trial of gabapentin.  - ANA; Future - CBC with Differential/Platelet; Future - Comprehensive metabolic panel; Future - TSH; Future - Vitamin B12; Future  2. Chronic pain of right knee Hx of scoping with no relief of pain in 2015. Feels like something loose and catching. Refer to ortho as they will do own films.  - Ambulatory referral to Orthopedics    Return if symptoms worsen or fail to improve.   [name], MD [facility]   01/30/2018

## 2018-01-31 ENCOUNTER — Other Ambulatory Visit (INDEPENDENT_AMBULATORY_CARE_PROVIDER_SITE_OTHER): Payer: 59

## 2018-01-31 DIAGNOSIS — R202 Paresthesia of skin: Secondary | ICD-10-CM | POA: Diagnosis not present

## 2018-01-31 LAB — CBC WITH DIFFERENTIAL/PLATELET
Basophils Absolute: 0.1 10*3/uL (ref 0.0–0.1)
Basophils Relative: 0.6 % (ref 0.0–3.0)
Eosinophils Absolute: 0.1 10*3/uL (ref 0.0–0.7)
Eosinophils Relative: 1.1 % (ref 0.0–5.0)
HCT: 41.2 % (ref 36.0–46.0)
HEMOGLOBIN: 13.6 g/dL (ref 12.0–15.0)
Lymphocytes Relative: 29.4 % (ref 12.0–46.0)
Lymphs Abs: 3 10*3/uL (ref 0.7–4.0)
MCHC: 33 g/dL (ref 30.0–36.0)
MCV: 90.9 fl (ref 78.0–100.0)
MONOS PCT: 5.3 % (ref 3.0–12.0)
Monocytes Absolute: 0.5 10*3/uL (ref 0.1–1.0)
Neutro Abs: 6.4 10*3/uL (ref 1.4–7.7)
Neutrophils Relative %: 63.6 % (ref 43.0–77.0)
Platelets: 268 10*3/uL (ref 150.0–400.0)
RBC: 4.53 Mil/uL (ref 3.87–5.11)
RDW: 14.6 % (ref 11.5–15.5)
WBC: 10.1 10*3/uL (ref 4.0–10.5)

## 2018-01-31 LAB — COMPREHENSIVE METABOLIC PANEL
ALT: 10 U/L (ref 0–35)
AST: 13 U/L (ref 0–37)
Albumin: 4.3 g/dL (ref 3.5–5.2)
Alkaline Phosphatase: 56 U/L (ref 39–117)
BUN: 14 mg/dL (ref 6–23)
CO2: 27 meq/L (ref 19–32)
Calcium: 9.7 mg/dL (ref 8.4–10.5)
Chloride: 100 mEq/L (ref 96–112)
Creatinine, Ser: 1.05 mg/dL (ref 0.40–1.20)
GFR: 72.79 mL/min (ref >60.00)
Glucose, Bld: 119 mg/dL — ABNORMAL HIGH (ref 70–99)
Potassium: 3.8 mEq/L (ref 3.5–5.1)
Sodium: 133 mEq/L — ABNORMAL LOW (ref 135–145)
Total Bilirubin: 0.5 mg/dL (ref 0.2–1.2)
Total Protein: 7.6 g/dL (ref 6.0–8.3)

## 2018-01-31 LAB — VITAMIN B12: VITAMIN B 12: 704 pg/mL (ref 211–911)

## 2018-01-31 LAB — TSH: TSH: 1.6 u[IU]/mL (ref 0.35–4.50)

## 2018-02-06 LAB — ANA: Anti Nuclear Antibody(ANA): NEGATIVE

## 2018-02-07 ENCOUNTER — Other Ambulatory Visit: Payer: Self-pay

## 2018-02-07 ENCOUNTER — Encounter: Payer: Self-pay | Admitting: Family Medicine

## 2018-02-07 ENCOUNTER — Other Ambulatory Visit: Payer: Self-pay | Admitting: Family Medicine

## 2018-02-07 DIAGNOSIS — R202 Paresthesia of skin: Secondary | ICD-10-CM

## 2018-02-07 MED ORDER — GABAPENTIN 300 MG PO CAPS: 300.0000 mg | ORAL_CAPSULE | Freq: Two times a day (BID) | ORAL | 0 refills | Status: DC

## 2018-02-13 ENCOUNTER — Ambulatory Visit (INDEPENDENT_AMBULATORY_CARE_PROVIDER_SITE_OTHER): Payer: 59 | Admitting: Orthopaedic Surgery

## 2018-02-13 ENCOUNTER — Ambulatory Visit (INDEPENDENT_AMBULATORY_CARE_PROVIDER_SITE_OTHER): Payer: 59

## 2018-02-13 ENCOUNTER — Encounter: Payer: Self-pay | Admitting: Neurology

## 2018-02-13 DIAGNOSIS — G8929 Other chronic pain: Secondary | ICD-10-CM | POA: Diagnosis not present

## 2018-02-13 DIAGNOSIS — M25561 Pain in right knee: Secondary | ICD-10-CM

## 2018-02-13 NOTE — Progress Notes (Signed)
++                                                                                  Office Visit Note   Patient: Morgan Oneal           Date of Birth: 02/13/1985           MRN: [id number] Visit Date: 02/13/2018              Requested by: [name], MD [address] [place], [place] [address] PCP: [name], MD   Assessment & Plan: Visit Diagnoses:  1. Chronic pain of right knee     Plan: I absolutely feel an MRI of her knee is warranted of the right knee at this standpoint given the locking catching she has as well as my physical exam findings showing a positive Murray sign and her medial joint line issues.  I am also highly concerned about the patellofemoral cartilage based on the very audible and loud grinding that I am hearing in that right knee even in exam room.  Still after that I have ever heard.  All question concerns were answered and addressed.  I do feel this MRI is medically warranted.  I would not recommend any more injections or other treatment modalities other than activity modification and rest while we wait on the MRI.  Follow-Up Instructions: Return in about 2 weeks (around 02/27/2018).   Orders:  Orders Placed This Encounter  Procedures  . XR Knee 1-2 Views Right   No orders of the defined types were placed in this encounter.     Procedures: No procedures performed   Clinical Data: No additional findings.   Subjective: Chief Complaint  Patient presents with  . Right Knee - Pain  Patient is a very Oneal 33 year old female that I am seeing for the first time as a patient.  She comes in for evaluation treatment of right knee pain and grinding as well as locking catching.  In 2015 she had arthroscopic intervention for this right knee.  Since then she still had a lot of issues with that knee.  She had gained a lot of weight after pregnancy and is now back to a  manageable weight and she works out quite a bit.  Her left knee is normal but the right knee does lock and catch to her.  She points the medial joint line as a source of her pain but also has significant grinding underneath her kneecap.  It is surprisingly very audible and loud in the office today as far as the grinding sound that I can hear when she walks and when she steps up.  It is almost as if there is sandpaper rubbing underneath her patella.  She is not a diabetic.  The locking catching is now detrimentally affecting her job as well as her mobility and her physical activities.  HPI  Review of Systems She currently denies any headache, chest pain, shortness of breath, fever, chills, nausea, vomiting  Objective: Vital Signs: LMP  (LMP Unknown)   Physical Exam She is alert and oriented x3 and in no acute distress Ortho Exam Examination of her left knee  is normal examination of her right painful knee shows significant medial joint line tenderness.  There is tenderness along the medial collateral ligament as well.  She does have a positive McMurray sign to the medial compartment of her knee.  Her Lockman's exam is negative.  Her patella seems to track centrally but there is severe and profound grinding at the patellofemoral joint on the right side which she does not have on the left side. Specialty Comments:  No specialty comments available.  Imaging: Xr Knee 1-2 Views Right  Result Date: 02/13/2018 An AP and lateral the right knee shows no acute findings.  The joint space in the patellofemoral joint appear to be well-maintained.    PMFS History: Patient Active Problem List   Diagnosis Date Noted  . Urge incontinence 08/01/2017  . Raynaud's disease, idiopathic 07/24/2017   Past Medical History:  Diagnosis Date  . Asthma   . Depression     Family History  Problem Relation Age of Onset  . Arthritis Father   . Arthritis Maternal Grandmother   . Diabetes Maternal Grandmother   .  Arthritis Paternal Grandmother   . Asthma Paternal Grandmother   . Cancer Paternal Grandmother   . Alcohol abuse Paternal Grandfather     Past Surgical History:  Procedure Laterality Date  . PARTIAL HYSTERECTOMY  2015   Social History   Occupational History  . Not on file  Tobacco Use  . Smoking status: Never Smoker  . Smokeless tobacco: Never Used  Substance and Sexual Activity  . Alcohol use: Yes    Comment: maybe once/week   . Drug use: Never  . Sexual activity: Yes    Partners: Male    Birth control/protection: Surgical

## 2018-02-14 ENCOUNTER — Other Ambulatory Visit (INDEPENDENT_AMBULATORY_CARE_PROVIDER_SITE_OTHER): Payer: Self-pay

## 2018-02-14 DIAGNOSIS — M25561 Pain in right knee: Principal | ICD-10-CM

## 2018-02-14 DIAGNOSIS — G8929 Other chronic pain: Secondary | ICD-10-CM

## 2018-02-21 ENCOUNTER — Encounter: Payer: Self-pay | Admitting: Family Medicine

## 2018-02-22 ENCOUNTER — Ambulatory Visit
Admission: RE | Admit: 2018-02-22 | Discharge: 2018-02-22 | Disposition: A | Discharge status: Home / Self Care | Payer: 59 | Source: Ambulatory Visit | Attending: Orthopaedic Surgery | Admitting: Orthopaedic Surgery

## 2018-02-22 DIAGNOSIS — M1711 Unilateral primary osteoarthritis, right knee: Secondary | ICD-10-CM | POA: Diagnosis not present

## 2018-02-22 DIAGNOSIS — G8929 Other chronic pain: Secondary | ICD-10-CM

## 2018-02-22 DIAGNOSIS — M25561 Pain in right knee: Principal | ICD-10-CM

## 2018-03-03 ENCOUNTER — Ambulatory Visit (INDEPENDENT_AMBULATORY_CARE_PROVIDER_SITE_OTHER): Payer: 59 | Admitting: Orthopaedic Surgery

## 2018-03-03 ENCOUNTER — Encounter (INDEPENDENT_AMBULATORY_CARE_PROVIDER_SITE_OTHER): Payer: Self-pay | Admitting: Orthopaedic Surgery

## 2018-03-03 DIAGNOSIS — M25561 Pain in right knee: Secondary | ICD-10-CM

## 2018-03-03 DIAGNOSIS — G8929 Other chronic pain: Secondary | ICD-10-CM

## 2018-03-03 NOTE — Progress Notes (Signed)
The patient is here today to go over an MRI of her right knee.  She has profound grinding at her patellofemoral joint.  She is actually had arthroscopic intervention of the right knee done elsewhere.  They actually performed a lateral release as well.  We sent her for an MRI to rule out any type of internal derangement that was missed due to her continued pain in that knee.  She feels like she would benefit from more extensive physical therapy as July.  Her pain is daily with that knee.  On exam there is no effusion but there is profound grinding of the patellofemoral joint.  The patella still tracked slightly laterally.  There is no medial lateral tenderness.  Knee is ligamentously stable.  The MRI of her right knee does show significant cartilage wear of the lateral facet of her patella but the remaining structures in her knee appear normal.  There is an incidental ganglion cyst in the posterior medial aspect of the knee but this is outside the knee joint itself and the cartilage in the medial lateral compartment is well-preserved.  The ACL PCL as well as medial lateral collateral ligaments are intact and the medial lateral meniscus shows no sign of tear.  At this point I do feel that she would benefit from formal physical therapy to work on strengthening of the right knee VMO as well as any type of kinetic taping they can help better realign the patella itself.  There is no other surgery that I think she would benefit from at this point.  All question concerns were answered and addressed.  We will see her back in about 3 months to see how she is doing overall.

## 2018-03-04 ENCOUNTER — Other Ambulatory Visit (INDEPENDENT_AMBULATORY_CARE_PROVIDER_SITE_OTHER): Payer: Self-pay

## 2018-03-04 DIAGNOSIS — G8929 Other chronic pain: Secondary | ICD-10-CM

## 2018-03-04 DIAGNOSIS — M25561 Pain in right knee: Principal | ICD-10-CM

## 2018-03-11 ENCOUNTER — Encounter: Payer: Self-pay | Admitting: Physical Therapy

## 2018-03-11 ENCOUNTER — Ambulatory Visit: Payer: 59 | Attending: Orthopaedic Surgery | Admitting: Physical Therapy

## 2018-03-11 ENCOUNTER — Other Ambulatory Visit: Payer: Self-pay

## 2018-03-11 DIAGNOSIS — R29898 Other symptoms and signs involving the musculoskeletal system: Secondary | ICD-10-CM | POA: Insufficient documentation

## 2018-03-11 DIAGNOSIS — G8929 Other chronic pain: Secondary | ICD-10-CM | POA: Insufficient documentation

## 2018-03-11 DIAGNOSIS — M25561 Pain in right knee: Secondary | ICD-10-CM | POA: Diagnosis not present

## 2018-03-11 NOTE — Therapy (Signed)
[facility] [address]  [address] [place], [place], [address] Phone: [phone]   Fax:  [phone]  Physical Therapy Evaluation  Patient Details  Name: Morgan Oneal MRN: [id number] Date of Birth: Jan 28, 1985 Referring Provider (PT): Doneen Poisson, MD   Encounter Date: 03/11/2018  PT End of Session - 03/11/18 1652    Visit Number  1    Number of Visits  13    Date for PT Re-Evaluation  04/22/18    Authorization Type  [facility]    PT Start Time  1612    PT Stop Time  1649    PT Time Calculation (min)  37 min    Activity Tolerance  Patient tolerated treatment well    Behavior During Therapy  [facility] for tasks assessed/performed       Past Medical History:  Diagnosis Date  . Asthma   . Depression     Past Surgical History:  Procedure Laterality Date  . PARTIAL HYSTERECTOMY  2015    There were no vitals filed for this visit.   Subjective Assessment - 03/11/18 1613    Subjective  Patient reports that she injured R knee in HS which caused it to lock in flexion. MD was unable to find out what was wrong with it but then it went back to normal. Had R knee arthroscopy in 2015 and has had difficulty with flexion and with "crunching" with going down stairs, squatting since that point. Intermittently gets stuck straight when she is driving.  No recent flare up, it has always been this painful.  Has had to stop going to the gym. Pain is located laterally over "ITB" and medially on R knee. Denies subluxations.    Pertinent History  depression, asthma, R knee arthroscopy with lateral release 2015    Limitations  Sitting;Lifting;Standing;Walking;House hold activities    How long can you sit comfortably?  1 hour limited by pain    How long can you stand comfortably?  2-3 hours    How long can you walk comfortably?  2-3 hours    Diagnostic tests  02/22/18 R knee MRI: Mild patellar cartilage degeneration as described above. No  definite meniscal tear. However, there is a 6 x 18 x 7 mm loculated cystic lesion behind the medial meniscus posterior horn and root.     Patient Stated Goals  strengthening the knee to return to the gym    Currently in Pain?  Yes    Pain Score  5     Pain Location  Knee    Pain Orientation  Right;Medial;Lateral    Pain Descriptors / Indicators  Aching    Pain Type  Chronic pain         [facility] PT Assessment - 03/11/18 1623      Assessment   Medical Diagnosis  Chronic pain of R knee    Referring Provider (PT)  Doneen Poisson, MD    Onset Date/Surgical Date  --   several years duration   Next MD Visit  06/03/18    Prior Therapy  yes- after knee scope      Precautions   Precautions  None      Restrictions   Weight Bearing Restrictions  No      Balance Screen   Has the patient fallen in the past 6 months  No    Has the patient had a decrease in activity level because of a fear of falling?   No  Is the patient reluctant to leave their home because of a fear of falling?   No      Home Environment   Living Environment  Private residence    Type of Home  Apartment    Home Access  Stairs to enter    Entrance Stairs-Number of Steps  15    Entrance Stairs-Rails  Right;Left    Home Layout  One level      Prior Function   Level of Independence  Independent    Vocation  Full time employment    GafferVocation Requirements  CNA    Leisure  gym      Cognition   Overall Cognitive Status  Within Functional Limits for tasks assessed      Observation/Other Assessments   Observations  considerable lateral tracking of R patella with knee extension    Focus on Therapeutic Outcomes (FOTO)   Knee: 54 (46% limited, 32% predicted)      Sensation   Light Touch  Appears Intact   reports B hand/foot N/T with prolonged positions     Coordination   Gross Motor Movements are Fluid and Coordinated  Yes      Posture/Postural Control   Posture/Postural Control  Postural limitations     Postural Limitations  Rounded Shoulders;Forward head      ROM / Strength   AROM / PROM / Strength  AROM;PROM;Strength      AROM   AROM Assessment Site  Knee    Right/Left Knee  Right;Left    Right Knee Extension  0    Right Knee Flexion  130    Left Knee Extension  0    Left Knee Flexion  135      PROM   PROM Assessment Site  Knee    Right/Left Knee  Right;Left    Right Knee Extension  -1    Right Knee Flexion  131   pain at lateral knee   Left Knee Extension  -1    Left Knee Flexion  137      Strength   Strength Assessment Site  Hip;Knee;Ankle    Right/Left Hip  Right;Left    Right Hip Flexion  4+/5    Right Hip ABduction  4+/5    Right Hip ADduction  4+/5    Left Hip Flexion  4+/5    Left Hip ABduction  4+/5    Left Hip ADduction  4+/5    Right/Left Knee  Right;Left    Right Knee Flexion  4+/5    Right Knee Extension  4/5   mild pain anteriorly   Left Knee Flexion  4+/5    Left Knee Extension  4+/5    Right/Left Ankle  Right;Left    Right Ankle Dorsiflexion  5/5    Left Ankle Dorsiflexion  5/5      Flexibility   Soft Tissue Assessment /Muscle Length  yes    ITB  severely tight on R, moderately on L      Palpation   Patella mobility  normal mobility and nonpainful    Palpation comment  TTP at R ITB insertion and pes anserine insertion      Ambulation/Gait   Gait Pattern  Within Functional Limits                Objective measurements completed on examination: See above findings.              PT Education - 03/11/18 1652    Education Details  prognosis, POC, HEP    Person(s) Educated  Patient    Methods  Explanation;Demonstration;Tactile cues;Verbal cues;Handout    Comprehension  Verbalized understanding;Returned demonstration       PT Short Term Goals - 03/11/18 1659      PT SHORT TERM GOAL #1   Title  Patient to be independent with initial HEP.    Time  3    Period  Weeks    Status  New    Target Date  04/01/18        PT  Long Term Goals - 03/11/18 1659      PT LONG TERM GOAL #1   Title  Patient to be independent with advanced HEP.    Time  6    Period  Weeks    Status  New    Target Date  04/22/18      PT LONG TERM GOAL #2   Title  Patient to demonstrate R knee AROM/PROM pain-free and symmetrical to L LE.    Time  6    Period  Weeks    Status  New    Target Date  04/22/18      PT LONG TERM GOAL #3   Title  Patient to demonstrate mild tightness in R TFL.    Time  6    Period  Weeks    Status  New    Target Date  04/22/18      PT LONG TERM GOAL #4   Title  Patient to demonstrate good R quad stability and no pain with stair climbing up/down 13 steps with 1 handrail as needed.     Time  6    Period  Weeks    Status  New    Target Date  04/22/18      PT LONG TERM GOAL #5   Title  Patient to return to gym activities without pain limiting.     Time  6    Period  Weeks    Status  New    Target Date  04/22/18             Plan - 03/11/18 1652    Clinical Impression Statement  Patient is a 32y/o F presenting to OPPT with c/o chronic R knee pain of several years duration with no recent flare. Reports difficulty with stairs, squats, and reports that R knee intermittently gets stuck in extension while driving. Patient reports "crunching" and pain laterally and medially over R knee. Patient today with considerable lateral patellar tracking with R knee extension, decreased R quad strength, decreased and painful R knee flexion, and tightness in B TFL. Educated on gentle stretching and strengthening HEP- patient reported understanding. Would benefit from skilled PT services 2x/week for 6 weeks to address aforementioned impairments.     Personal Factors and Comorbidities  Past/Current Experience;Comorbidity 3+;Time since onset of injury/illness/exacerbation    Comorbidities  depression, asthma, R knee arthroscopy with lateral release     Examination-Activity Limitations   Bend;Sit;Carry;Squat;Stairs;Transfers;Lift;Locomotion Level;Stand    Examination-Participation Restrictions  Community Activity;Shop;Driving;Interpersonal Relationship;Yard Work;Laundry;Meal Prep    Stability/Clinical Decision Making  Stable/Uncomplicated    Clinical Decision Making  Low    Rehab Potential  Good    PT Frequency  2x / week    PT Duration  6 weeks    PT Treatment/Interventions  ADLs/Self Care Home Management;Cryotherapy;Electrical Stimulation;Iontophoresis 4mg /ml Dexamethasone;Functional mobility training;Stair training;Gait training;Ultrasound;Moist Heat;Therapeutic activities;Therapeutic exercise;Balance training;Neuromuscular re-education;Patient/family education;Passive range of motion;Manual techniques;Dry needling;Energy conservation;Splinting;Taping;Vasopneumatic Device  PT Next Visit Plan  reassess HEP    Consulted and Agree with Plan of Care  Patient       Patient will benefit from skilled therapeutic intervention in order to improve the following deficits and impairments:  Abnormal gait, Decreased activity tolerance, Decreased strength, Increased fascial restricitons, Pain, Difficulty walking, Decreased balance, Decreased range of motion, Improper body mechanics, Postural dysfunction, Impaired flexibility  Visit Diagnosis: Chronic pain of right knee  Other symptoms and signs involving the musculoskeletal system     Problem List Patient Active Problem List   Diagnosis Date Noted  . Urge incontinence 08/01/2017  . Raynaud's disease, idiopathic 07/24/2017     Anette Guarneri, PT, DPT 03/11/18 5:03 PM   [facility] [address]  [address] [place], [place], [address] Phone: [phone]   Fax:  [phone]  Name: Morgan Oneal MRN: [id number] Date of Birth: 1985/06/29

## 2018-03-12 ENCOUNTER — Other Ambulatory Visit: Payer: Self-pay | Admitting: Family Medicine

## 2018-03-18 ENCOUNTER — Other Ambulatory Visit: Payer: Self-pay

## 2018-03-18 ENCOUNTER — Ambulatory Visit: Payer: 59

## 2018-03-18 DIAGNOSIS — M25561 Pain in right knee: Principal | ICD-10-CM

## 2018-03-18 DIAGNOSIS — G8929 Other chronic pain: Secondary | ICD-10-CM

## 2018-03-18 DIAGNOSIS — R29898 Other symptoms and signs involving the musculoskeletal system: Secondary | ICD-10-CM | POA: Diagnosis not present

## 2018-03-18 NOTE — Therapy (Signed)
[facility] [address]  [address] [place], [place], [address] Phone: [phone]   Fax:  [phone]  Physical Therapy Treatment  Patient Details  Name: Morgan Oneal MRN: [id number] Date of Birth: 10/24/1985 Referring Provider (PT): Doneen Poisson, MD   Encounter Date: 03/18/2018  PT End of Session - 03/18/18 1618    Visit Number  2    Number of Visits  13    Date for PT Re-Evaluation  04/22/18    Authorization Type  [facility]    PT Start Time  1615    PT Stop Time  1701    PT Time Calculation (min)  46 min    Activity Tolerance  Patient tolerated treatment well    Behavior During Therapy  [facility] for tasks assessed/performed       Past Medical History:  Diagnosis Date  . Asthma   . Depression     Past Surgical History:  Procedure Laterality Date  . PARTIAL HYSTERECTOMY  2015    There were no vitals filed for this visit.  Subjective Assessment - 03/18/18 1618    Subjective  Pt. reporting HEP going well.      Pertinent History  depression, asthma, R knee arthroscopy with lateral release 2015    Diagnostic tests  02/22/18 R knee MRI: Mild patellar cartilage degeneration as described above. No definite meniscal tear. However, there is a 6 x 18 x 7 mm loculated cystic lesion behind the medial meniscus posterior horn and root.     Patient Stated Goals  strengthening the knee to return to the gym    Currently in Pain?  Yes    Pain Score  7     Pain Location  Knee    Pain Orientation  Right;Lateral;Medial    Pain Descriptors / Indicators  Aching    Pain Type  Chronic pain    Aggravating Factors   Prolonged standing, prolonged walking    Multiple Pain Sites  No                       [facility] Adult PT Treatment/Exercise - 03/18/18 1631      Knee/Hip Exercises: Stretches   Passive Hamstring Stretch  Right;2 reps;30 seconds    Passive Hamstring Stretch Limitations  strap     Quad Stretch  Right;2  reps;30 seconds    Quad Stretch Limitations  strap     Hip Flexor Stretch  Right;1 rep;30 seconds    Hip Flexor Stretch Limitations  strap     ITB Stretch  Right;2 reps;30 seconds    ITB Stretch Limitations  strap     Piriformis Stretch  Right;2 reps;30 seconds    Piriformis Stretch Limitations  KTOS      Knee/Hip Exercises: Aerobic   Nustep  Lvl 3, 6 min (UE, LE)       Knee/Hip Exercises: Standing   Step Down  Right;10 reps;Step Height: 4";Hand Hold: 2    Step Down Limitations  light UE support     Functional Squat  15 reps;3 seconds    Functional Squat Limitations  TRX  -  red TB at knees     Wall Squat  10 reps;3 seconds    Wall Squat Limitations  ball squeeze at knees       Knee/Hip Exercises: Supine   Bridges with [employer]  Both;Strengthening;15 reps      Knee/Hip Exercises: Sidelying   Clams  R clam  shell with red TB at knees x 10 reps      Manual Therapy   Manual Therapy  Taping    Kinesiotex  Create Space      Kinesiotix   Create Space  R knee chondromalacia taping pattern                PT Short Term Goals - 03/18/18 1629      PT SHORT TERM GOAL #1   Title  Patient to be independent with initial HEP.    Time  3    Period  Weeks    Status  On-going    Target Date  04/01/18        PT Long Term Goals - 03/18/18 1629      PT LONG TERM GOAL #1   Title  Patient to be independent with advanced HEP.    Time  6    Period  Weeks    Status  On-going      PT LONG TERM GOAL #2   Title  Patient to demonstrate R knee AROM/PROM pain-free and symmetrical to L LE.    Time  6    Period  Weeks    Status  On-going      PT LONG TERM GOAL #3   Title  Patient to demonstrate mild tightness in R TFL.    Time  6    Period  Weeks    Status  On-going      PT LONG TERM GOAL #4   Title  Patient to demonstrate good R quad stability and no pain with stair climbing up/down 13 steps with 1 handrail as needed.     Time  6    Period  Weeks    Status  On-going       PT LONG TERM GOAL #5   Title  Patient to return to gym activities without pain limiting.     Time  6    Period  Weeks    Status  On-going            Plan - 03/18/18 1621    Clinical Impression Statement  Reports she's performing HEP without issue.  Minor cueing required for proper positioning with ITB stretch and pacing with SLR+ER.  Tolerated session focused on R proximal hip flexibility and quad/VMO strengthening activities well today.  ended visit with trial of K-taping to R knee for hopeful improvement in pain.       Personal Factors and Comorbidities  Past/Current Experience;Comorbidity 3+;Time since onset of injury/illness/exacerbation    Comorbidities  depression, asthma, R knee arthroscopy with lateral release     Examination-Activity Limitations  Bend;Sit;Carry;Squat;Stairs;Transfers;Lift;Locomotion Level;Stand    Examination-Participation Restrictions  Community Activity;Shop;Driving;Interpersonal Relationship;Yard Work;Laundry;Meal Prep    Stability/Clinical Decision Making  Stable/Uncomplicated    Rehab Potential  Good    PT Treatment/Interventions  ADLs/Self Care Home Management;Cryotherapy;Electrical Stimulation;Iontophoresis /ml Dexamethasone;Functional mobility training;Stair training;Gait training;Ultrasound;Moist Heat;Therapeutic activities;Therapeutic exercise;Balance training;Neuromuscular re-education;Patient/family education;Passive range of motion;Manual techniques;Dry needling;Energy conservation;Splinting;Taping;Vasopneumatic Device    PT Next Visit Plan  monitor response to taping    Consulted and Agree with Plan of Care  Patient       Patient will benefit from skilled therapeutic intervention in order to improve the following deficits and impairments:  Abnormal gait, Decreased activity tolerance, Decreased strength, Increased fascial restricitons, Pain, Difficulty walking, Decreased balance, Decreased range of motion, Improper body mechanics, Postural  dysfunction, Impaired flexibility  Visit Diagnosis: Chronic pain of right knee  Other symptoms  and signs involving the musculoskeletal system     Problem List Patient Active Problem List   Diagnosis Date Noted  . Urge incontinence 08/01/2017  . Raynaud's disease, idiopathic 07/24/2017    Kermit Balo, PTA 03/18/18 5:22 PM   [facility] [address]  [address] [place], [place], [address] Phone: [phone]   Fax:  [phone]  Name: JACKQUELIN SHANOR MRN: [id number] Date of Birth: 1985/04/05

## 2018-03-19 ENCOUNTER — Encounter: Payer: Self-pay | Admitting: Family Medicine

## 2018-03-20 ENCOUNTER — Emergency Department (HOSPITAL_BASED_OUTPATIENT_CLINIC_OR_DEPARTMENT_OTHER): Payer: 59

## 2018-03-20 ENCOUNTER — Other Ambulatory Visit: Payer: Self-pay

## 2018-03-20 ENCOUNTER — Emergency Department (HOSPITAL_BASED_OUTPATIENT_CLINIC_OR_DEPARTMENT_OTHER)
Admission: EM | Admit: 2018-03-20 | Discharge: 2018-03-20 | Disposition: A | Discharge status: Home / Self Care | Payer: 59 | Attending: Emergency Medicine | Admitting: Emergency Medicine

## 2018-03-20 ENCOUNTER — Encounter: Payer: Self-pay | Admitting: Physical Therapy

## 2018-03-20 ENCOUNTER — Ambulatory Visit: Payer: 59 | Admitting: Physical Therapy

## 2018-03-20 ENCOUNTER — Encounter (HOSPITAL_BASED_OUTPATIENT_CLINIC_OR_DEPARTMENT_OTHER): Payer: Self-pay | Admitting: *Deleted

## 2018-03-20 DIAGNOSIS — R102 Pelvic and perineal pain: Secondary | ICD-10-CM | POA: Insufficient documentation

## 2018-03-20 DIAGNOSIS — M549 Dorsalgia, unspecified: Secondary | ICD-10-CM | POA: Insufficient documentation

## 2018-03-20 DIAGNOSIS — G8929 Other chronic pain: Secondary | ICD-10-CM

## 2018-03-20 DIAGNOSIS — J45909 Unspecified asthma, uncomplicated: Secondary | ICD-10-CM | POA: Diagnosis not present

## 2018-03-20 DIAGNOSIS — Z79899 Other long term (current) drug therapy: Secondary | ICD-10-CM | POA: Insufficient documentation

## 2018-03-20 DIAGNOSIS — M25561 Pain in right knee: Principal | ICD-10-CM

## 2018-03-20 DIAGNOSIS — R1030 Lower abdominal pain, unspecified: Secondary | ICD-10-CM | POA: Diagnosis present

## 2018-03-20 DIAGNOSIS — R29898 Other symptoms and signs involving the musculoskeletal system: Secondary | ICD-10-CM

## 2018-03-20 DIAGNOSIS — N739 Female pelvic inflammatory disease, unspecified: Secondary | ICD-10-CM | POA: Diagnosis not present

## 2018-03-20 LAB — CBC
HCT: 38.6 % (ref 36.0–46.0)
Hemoglobin: 12.2 g/dL (ref 12.0–15.0)
MCH: 29.7 pg (ref 26.0–34.0)
MCHC: 31.6 g/dL (ref 30.0–36.0)
MCV: 93.9 fL (ref 80.0–100.0)
Platelets: 274 10*3/uL (ref 150–400)
RBC: 4.11 MIL/uL (ref 3.87–5.11)
RDW: 13.2 % (ref 11.5–15.5)
WBC: 10.7 10*3/uL — ABNORMAL HIGH (ref 4.0–10.5)
nRBC: 0 % (ref 0.0–0.2)

## 2018-03-20 LAB — URINALYSIS, MICROSCOPIC (REFLEX): RBC / HPF: NONE SEEN RBC/hpf (ref 0–5)

## 2018-03-20 LAB — COMPREHENSIVE METABOLIC PANEL
ALBUMIN: 3.9 g/dL (ref 3.5–5.0)
ALT: 11 U/L (ref 0–44)
AST: 16 U/L (ref 15–41)
Alkaline Phosphatase: 72 U/L (ref 38–126)
Anion gap: 7 (ref 5–15)
BUN: 11 mg/dL (ref 6–20)
CO2: 26 mmol/L (ref 22–32)
Calcium: 9.3 mg/dL (ref 8.9–10.3)
Chloride: 104 mmol/L (ref 98–111)
Creatinine, Ser: 0.98 mg/dL (ref 0.44–1.00)
GFR calc Af Amer: >60 mL/min (ref >60)
GFR calc non Af Amer: >60 mL/min (ref >60)
GLUCOSE: 109 mg/dL — AB (ref 70–99)
Potassium: 4.1 mmol/L (ref 3.5–5.1)
Sodium: 137 mmol/L (ref 135–145)
Total Bilirubin: 0.4 mg/dL (ref 0.3–1.2)
Total Protein: 7.6 g/dL (ref 6.5–8.1)

## 2018-03-20 LAB — URINALYSIS, ROUTINE W REFLEX MICROSCOPIC
Bilirubin Urine: NEGATIVE
Glucose, UA: NEGATIVE mg/dL
Hgb urine dipstick: NEGATIVE
Ketones, ur: NEGATIVE mg/dL
Nitrite: NEGATIVE
Protein, ur: NEGATIVE mg/dL
Specific Gravity, Urine: 1.02 (ref 1.005–1.030)
pH: 6.5 (ref 5.0–8.0)

## 2018-03-20 LAB — WET PREP, GENITAL
Sperm: NONE SEEN
Yeast Wet Prep HPF POC: NONE SEEN

## 2018-03-20 LAB — PREGNANCY, URINE: Preg Test, Ur: NEGATIVE

## 2018-03-20 MED ORDER — DOXYCYCLINE HYCLATE 100 MG PO CAPS: 100.0000 mg | ORAL_CAPSULE | Freq: Two times a day (BID) | ORAL | 0 refills | Status: DC

## 2018-03-20 MED ORDER — METRONIDAZOLE 500 MG PO TABS: 500.0000 mg | ORAL_TABLET | Freq: Two times a day (BID) | ORAL | 0 refills | Status: DC

## 2018-03-20 MED ORDER — NAPROXEN 500 MG PO TABS: 500.0000 mg | ORAL_TABLET | Freq: Two times a day (BID) | ORAL | 0 refills | Status: DC

## 2018-03-20 NOTE — Therapy (Signed)
[facility] [address]  [address] [place], [place], [address] Phone: [phone]   Fax:  [phone]  Physical Therapy Treatment  Patient Details  Name: Morgan Oneal MRN: [id number] Date of Birth: 07-15-1985 Referring Provider (PT): Doneen Poisson, MD   Encounter Date: 03/20/2018  PT End of Session - 03/20/18 1710    Visit Number  3    Number of Visits  13    Date for PT Re-Evaluation  04/22/18    Authorization Type  [facility]    PT Start Time  1621    PT Stop Time  1704    PT Time Calculation (min)  43 min    Activity Tolerance  Patient tolerated treatment well    Behavior During Therapy  [facility] for tasks assessed/performed       Past Medical History:  Diagnosis Date  . Asthma   . Depression     Past Surgical History:  Procedure Laterality Date  . PARTIAL HYSTERECTOMY  2015    There were no vitals filed for this visit.  Subjective Assessment - 03/20/18 1619    Subjective  Reports that she is planning to go to the ED after this appointment because her PCP does screening for the corona virus after 3:00. Reports that her R knee was "on fire" on Wednesday, but tape did help.     Pertinent History  depression, asthma, R knee arthroscopy with lateral release 2015    Diagnostic tests  02/22/18 R knee MRI: Mild patellar cartilage degeneration as described above. No definite meniscal tear. However, there is a 6 x 18 x 7 mm loculated cystic lesion behind the medial meniscus posterior horn and root.     Patient Stated Goals  strengthening the knee to return to the gym    Currently in Pain?  No/denies                       [facility] Adult PT Treatment/Exercise - 03/20/18 0001      Knee/Hip Exercises: Stretches   Higher education careers adviser reps;30 seconds    Quad Stretch Limitations  prone with strap    Hip Flexor Stretch  Right;2 reps;30 seconds    Hip Flexor Stretch Limitations  mod thomas with strap    ITB  Stretch  Right;2 reps;30 seconds    ITB Stretch Limitations  strap       Knee/Hip Exercises: Aerobic   Nustep  Lvl 3, 6 min (LEs only)       Knee/Hip Exercises: Standing   Terminal Knee Extension  Strengthening;Right;1 set;10 reps;Theraband    Theraband Level (Terminal Knee Extension)  Level 4 (Blue)    Terminal Knee Extension Limitations  10x3" with UE support on chair   cues to avoid valgus collapse and proper alignment   Wall Squat  10 reps;3 seconds    Wall Squat Limitations  ball squeeze at knees    cracking in R knee- but tolerable     Knee/Hip Exercises: Supine   Bridges with [employer] Squeeze  Both;Strengthening;10 reps;1 set    Henreitta Leber with Clamshell  Strengthening;Both;1 set;10 reps   with red TB   Straight Leg Raise with External Rotation  Strengthening;Right;2 sets;10 reps    Straight Leg Raise with External Rotation Limitations  2nd set with 2#; cues for quad set before each rep      Knee/Hip Exercises: Prone   Hip Extension  Strengthening;Right;Left;1 set;10 reps    Hip  Extension Limitations  prone donkey kicks   cues to maintain bent knee; increased difficulty on L     Manual Therapy   Manual Therapy  Taping    Kinesiotex  Create Space      Kinesiotix   Create Space  R knee chondromalacia taping pattern    step by step instruction and guidance on proper tape placeme            PT Education - 03/20/18 1709    Education Details  update to HEP; edu on proper KT taping placement    Person(s) Educated  Patient    Methods  Explanation;Demonstration;Tactile cues;Verbal cues;Handout    Comprehension  Verbalized understanding;Returned demonstration       PT Short Term Goals - 03/20/18 1715      PT SHORT TERM GOAL #1   Title  Patient to be independent with initial HEP.    Time  3    Period  Weeks    Status  Achieved    Target Date  04/01/18        PT Long Term Goals - 03/18/18 1629      PT LONG TERM GOAL #1   Title  Patient to be independent with  advanced HEP.    Time  6    Period  Weeks    Status  On-going      PT LONG TERM GOAL #2   Title  Patient to demonstrate R knee AROM/PROM pain-free and symmetrical to L LE.    Time  6    Period  Weeks    Status  On-going      PT LONG TERM GOAL #3   Title  Patient to demonstrate mild tightness in R TFL.    Time  6    Period  Weeks    Status  On-going      PT LONG TERM GOAL #4   Title  Patient to demonstrate good R quad stability and no pain with stair climbing up/down 13 steps with 1 handrail as needed.     Time  6    Period  Weeks    Status  On-going      PT LONG TERM GOAL #5   Title  Patient to return to gym activities without pain limiting.     Time  6    Period  Weeks    Status  On-going            Plan - 03/20/18 1711    Clinical Impression Statement  Patient arrived to session with report of increased pain in R knee on Wednesday, but report of improvement in pain levels with KT tape. Able to progress SLR with ER with weighted resistance with no evidence of quad lag. Progressed bridge for lateral hip activation with good form and tolerance. Patient with c/o mild discomfort in R knee with prone quad stretch, better tolerance for modified Thomas stretch. Introduced prone donkey kicks with patient demonstrating considerable difficulty on R LE, improved ease on L LE. Patient still with audible crepitus on R LE with wall squats. Ended session with instruction on KT taping to R knee for continued pain relief. Also updated HEP with additional exercises that were well-tolerated today. Patient reported understanding and with no complaints at end of session.     Comorbidities  depression, asthma, R knee arthroscopy with lateral release     PT Treatment/Interventions  ADLs/Self Care Home Management;Cryotherapy;Electrical Stimulation;Iontophoresis 4mg /ml Dexamethasone;Functional mobility training;Stair training;Gait training;Ultrasound;Moist Heat;Therapeutic activities;Therapeutic  exercise;Balance training;Neuromuscular re-education;Patient/family education;Passive range of motion;Manual techniques;Dry needling;Energy conservation;Splinting;Taping;Vasopneumatic Device    PT Next Visit Plan  progress LE stretching and strengthening to tolerance    Consulted and Agree with Plan of Care  Patient       Patient will benefit from skilled therapeutic intervention in order to improve the following deficits and impairments:  Abnormal gait, Decreased activity tolerance, Decreased strength, Increased fascial restricitons, Pain, Difficulty walking, Decreased balance, Decreased range of motion, Improper body mechanics, Postural dysfunction, Impaired flexibility  Visit Diagnosis: Chronic pain of right knee  Other symptoms and signs involving the musculoskeletal system     Problem List Patient Active Problem List   Diagnosis Date Noted  . Urge incontinence 08/01/2017  . Raynaud's disease, idiopathic 07/24/2017    Anette Guarneri, PT, DPT 03/20/18 5:17 PM   [facility] [address]  [address] [place], [place], [address] Phone: [phone]   Fax:  [phone]  Name: GUDALUPE ALMARIO MRN: [id number] Date of Birth: [birth year]

## 2018-03-20 NOTE — ED Notes (Signed)
ED Provider at bedside. 

## 2018-03-20 NOTE — ED Provider Notes (Signed)
[facility] EMERGENCY DEPARTMENT Provider Note   CSN: [id number] Arrival date & time: 03/20/18  1708    History   Chief Complaint Chief Complaint  Patient presents with  . Abdominal Pain    HPI Morgan Oneal is a 33 y.o. female with a hx of partial hysterectomy, asthma, depression, and raynaud's who presents to the ER with complaints of abdominal pain that started yesterday. Patient notes bilateral lower abdominal/pelvic/back pain intermittently, lasts a few hours at a time, no alleviating/aggravating factors. Notes associated urinary frequency. She states hx of similar with prior STD, she thinks this was chlamydia, was not diagnosed with PID. Denies fever, chills, nausea, vomiting, diarrhea, melena, hematochezia, dysuria, vaginal bleeding, or vaginal discharge. States she no longer gets her menstrual cycle secondary to hysterectomy.  She is sexually active with one female partner in a monogamous relationship, does not use protection.        HPI  Past Medical History:  Diagnosis Date  . Asthma   . Depression     Patient Active Problem List   Diagnosis Date Noted  . Urge incontinence 08/01/2017  . Raynaud's disease, idiopathic 07/24/2017    Past Surgical History:  Procedure Laterality Date  . PARTIAL HYSTERECTOMY  2015     OB History   No obstetric history on file.      Home Medications    Prior to Admission medications   Medication Sig Start Date End Date Taking? Authorizing Provider  Biotin 1 MG CAPS Take by mouth.   Yes [provider]  escitalopram (LEXAPRO) 10 MG tablet Take by mouth.   Yes [provider]  fluticasone (FLONASE) 50 MCG/ACT nasal spray Place into the nose.   Yes [provider]  gabapentin (NEURONTIN) 300 MG capsule TAKE 1 CAPSULE BY MOUTH TWICE A DAY 03/19/18  Yes [name], MD  loratadine (CLARITIN) 10 MG tablet Take 10 mg by mouth daily.   Yes [provider]  mirabegron ER (MYRBETRIQ) 25  MG TB24 tablet Take 1 tablet (25 mg total) by mouth daily. 08/01/17  Yes [name], MD  montelukast (SINGULAIR) 10 MG tablet Take 10 mg by mouth at bedtime.   Yes [provider]  Multiple Vitamin (MULTIVITAMIN) tablet Take 1 tablet by mouth daily.   Yes [provider]  pantoprazole (PROTONIX) 40 MG tablet Take 1 tablet (40 mg total) by mouth daily. 06/14/17  Yes [name], MD  traMADol (ULTRAM) 50 MG tablet Take by mouth. 02/11/16  Yes [provider]  traZODone (DESYREL) 50 MG tablet Take by mouth.   Yes [provider]  valACYclovir (VALTREX) 500 MG tablet Take by mouth. 10/26/15  Yes [provider]    Family History Family History  Problem Relation Age of Onset  . Arthritis Father   . Arthritis Maternal Grandmother   . Diabetes Maternal Grandmother   . Arthritis Paternal Grandmother   . Asthma Paternal Grandmother   . Cancer Paternal Grandmother   . Alcohol abuse Paternal Grandfather     Social History Social History   Tobacco Use  . Smoking status: Never Smoker  . Smokeless tobacco: Never Used  Substance Use Topics  . Alcohol use: Yes    Comment: maybe once/week   . Drug use: Never     Allergies   Mucinex [guaifenesin er] and Penicillins   Review of Systems Review of Systems  Constitutional: Negative for chills and fever.  HENT: Positive for congestion.   Respiratory: Negative for cough and  shortness of breath.   Cardiovascular: Negative for chest pain.  Gastrointestinal: Positive for abdominal pain. Negative for constipation, diarrhea, nausea and vomiting.  Genitourinary: Positive for frequency. Negative for dysuria, hematuria, vaginal bleeding and vaginal discharge.  Musculoskeletal: Positive for back pain.  Neurological: Negative for weakness and numbness.       Negative for incontinence/saddle anesthesia.   All other systems reviewed and are negative.    Physical Exam Updated Vital Signs BP 114/75 (BP  Location: Right Arm)   Pulse 81   Temp 97.7 F (36.5 C) (Oral)   Resp 16   Ht  (1.702 m)   Wt 83.1 kg   LMP  (LMP Unknown)   SpO2 100%   BMI 28.70 kg/m   Physical Exam Vitals signs and nursing note reviewed. Exam conducted with a chaperone present.  Constitutional:      General: She is not in acute distress.    Appearance: She is well-developed. She is not toxic-appearing.  HENT:     Head: Normocephalic and atraumatic.     Right Ear: Ear canal normal. Tympanic membrane is not perforated, erythematous, retracted or bulging.     Left Ear: Ear canal normal. Tympanic membrane is not perforated, erythematous, retracted or bulging.     Ears:     Comments: No mastoid erythema/swelling/tenderness.     Nose:     Right Sinus: No maxillary sinus tenderness or frontal sinus tenderness.     Left Sinus: No maxillary sinus tenderness or frontal sinus tenderness.     Mouth/Throat:     Pharynx: Uvula midline. No oropharyngeal exudate or posterior oropharyngeal erythema.     Comments: Posterior oropharynx is symmetric appearing. Patient tolerating own secretions without difficulty. No trismus. No drooling. No hot potato voice. No swelling beneath the tongue, submandibular compartment is soft.  Eyes:     General:        Right eye: No discharge.        Left eye: No discharge.     Conjunctiva/sclera: Conjunctivae normal.     Pupils: Pupils are equal, round, and reactive to light.  Neck:     Musculoskeletal: Normal range of motion and neck supple. No edema, neck rigidity, spinous process tenderness or muscular tenderness.  Cardiovascular:     Rate and Rhythm: Normal rate and regular rhythm.     Heart sounds: No murmur.  Pulmonary:     Effort: Pulmonary effort is normal. No respiratory distress.     Breath sounds: Normal breath sounds. No wheezing, rhonchi or rales.  Abdominal:     General: There is no distension.     Palpations: Abdomen is soft.     Tenderness: There is abdominal  tenderness in the suprapubic area. There is no guarding or rebound. Negative signs include McBurney's sign.  Genitourinary:    Labia:        Right: No lesion.        Left: No lesion.      Vagina: Vaginal discharge (white, large amount) present. No bleeding.     Adnexa:        Right: No mass, tenderness or fullness.         Left: Tenderness present. No mass or fullness.    Musculoskeletal:     Comments: No obvious deformity, appreciable swelling, erythema, ecchymosis, significant open wounds, or increased warmth.  Extremities: Normal ROM. Nontender.  Back: No point/focal vertebral tenderness, no palpable step off or crepitus.   Lymphadenopathy:     Cervical: No  cervical adenopathy.  Skin:    General: Skin is warm and dry.     Findings: No rash.  Neurological:     Mental Status: She is alert.     Deep Tendon Reflexes:     Reflex Scores:      Patellar reflexes are 2+ on the right side and 2+ on the left side.    Comments: Sensation grossly intact to bilateral lower extremities. 5/5 symmetric strength with plantar/dorsiflexion bilaterally. Gait is intact without obvious foot drop.   Psychiatric:        Behavior: Behavior normal.      ED Treatments / Results  Labs (all labs ordered are listed, but only abnormal results are displayed) Labs Reviewed  WET PREP, GENITAL - Abnormal; Notable for the following components:      Result Value   Trich, Wet Prep PRESENT (*)    Clue Cells Wet Prep HPF POC PRESENT (*)    WBC, Wet Prep HPF POC MODERATE (*)    All other components within normal limits  URINALYSIS, ROUTINE W REFLEX MICROSCOPIC - Abnormal; Notable for the following components:   Leukocytes,Ua TRACE (*)    All other components within normal limits  CBC - Abnormal; Notable for the following components:   WBC 10.7 (*)    All other components within normal limits  COMPREHENSIVE METABOLIC PANEL - Abnormal; Notable for the following components:   Glucose, Bld 109 (*)    All other  components within normal limits  URINE CULTURE  PREGNANCY, URINE  URINALYSIS, MICROSCOPIC (REFLEX)  RPR  HIV ANTIBODY (ROUTINE TESTING W REFLEX)  GC/CHLAMYDIA PROBE AMP ([place]) NOT AT [facility]    EKG None  Radiology US Transvaginal Non-ob  Result Date: 03/20/2018 CLINICAL DATA:  Pelvic pain EXAM: TRANSABDOMINAL AND TRANSVAGINAL ULTRASOUND OF PELVIS DOPPLER ULTRASOUND OF OVARIES TECHNIQUE: Study was performed transabdominally to optimize pelvic field of view evaluation and transvaginally to optimize internal visceral architecture evaluation. Color and duplex Doppler ultrasound was utilized to evaluate blood flow to the ovaries. COMPARISON:  None. FINDINGS: Uterus Surgically absent. Right ovary Measurements: 2.9 x 1.5 x 1.8 cm = volume: 3.9 mL. Normal appearance/no adnexal mass. Left ovary Measurements: 2.4 x 1.4 x 1.6 cm = volume: 2.6 mL. Normal appearance/no adnexal mass. Pulsed Doppler evaluation of both ovaries demonstrates normal low-resistance arterial and venous waveforms. Other findings No abnormal free fluid. IMPRESSION: Normal appearing ovaries bilaterally. No demonstrable ovarian torsion. No pelvic mass or free pelvic fluid. Uterus absent. Electronically Signed   By: Bretta Bang III M.D.   On: 03/20/2018 19:03   US Pelvis Complete  Result Date: 03/20/2018 CLINICAL DATA:  Pelvic pain EXAM: TRANSABDOMINAL AND TRANSVAGINAL ULTRASOUND OF PELVIS DOPPLER ULTRASOUND OF OVARIES TECHNIQUE: Study was performed transabdominally to optimize pelvic field of view evaluation and transvaginally to optimize internal visceral architecture evaluation. Color and duplex Doppler ultrasound was utilized to evaluate blood flow to the ovaries. COMPARISON:  None. FINDINGS: Uterus Surgically absent. Right ovary Measurements: 2.9 x 1.5 x 1.8 cm = volume: 3.9 mL. Normal appearance/no adnexal mass. Left ovary Measurements: 2.4 x 1.4 x 1.6 cm = volume: 2.6 mL. Normal appearance/no adnexal mass. Pulsed Doppler  evaluation of both ovaries demonstrates normal low-resistance arterial and venous waveforms. Other findings No abnormal free fluid. IMPRESSION: Normal appearing ovaries bilaterally. No demonstrable ovarian torsion. No pelvic mass or free pelvic fluid. Uterus absent. Electronically Signed   By: Bretta Bang III M.D.   On: 03/20/2018 19:03   [place] Art/ven Flow Abd  Pelv Doppler  Result Date: 03/20/2018 CLINICAL DATA:  Pelvic pain EXAM: TRANSABDOMINAL AND TRANSVAGINAL ULTRASOUND OF PELVIS DOPPLER ULTRASOUND OF OVARIES TECHNIQUE: Study was performed transabdominally to optimize pelvic field of view evaluation and transvaginally to optimize internal visceral architecture evaluation. Color and duplex Doppler ultrasound was utilized to evaluate blood flow to the ovaries. COMPARISON:  None. FINDINGS: Uterus Surgically absent. Right ovary Measurements: 2.9 x 1.5 x 1.8 cm = volume: 3.9 mL. Normal appearance/no adnexal mass. Left ovary Measurements: 2.4 x 1.4 x 1.6 cm = volume: 2.6 mL. Normal appearance/no adnexal mass. Pulsed Doppler evaluation of both ovaries demonstrates normal low-resistance arterial and venous waveforms. Other findings No abnormal free fluid. IMPRESSION: Normal appearing ovaries bilaterally. No demonstrable ovarian torsion. No pelvic mass or free pelvic fluid. Uterus absent. Electronically Signed   By: Bretta Bang III M.D.   On: 03/20/2018 19:03    Procedures Procedures (including critical care time)  Medications Ordered in ED Medications - No data to display   Initial Impression / Assessment and Plan / ED Course  I have reviewed the triage vital signs and the nursing notes.  Pertinent labs & imaging results that were available during my care of the patient were reviewed by me and considered in my medical decision making (see chart for details).   Patient presents to the ER for abdominal pain. Nontoxic appearing, in no apparent distress, vitals WNL. Exam with mild suprapubic and  left adnexal tenderness, no peritoneal signs.  Additionally of note is large amount of white vaginal discharge on speculum exam.  Plan for labs, STD testing, and pelvic ultrasound.  Reviewed: CBC: Nonspecific leukocytosis at 10.7.  No anemia. CMP: Unremarkable. Urinalysis: Trace leuks, not overly consistent with UTI, will culture. Pregnancy test: Negative, doubt ectopic Wet prep: Positive for trichomonas and bacterial vaginosis. Pelvic ultrasound: Normal.  No concerning masses or fluid collections noted.  No ovarian torsion.  Repeat abdominal exam remains without peritoneal signs.  Based on H&P do not suspect appendicitis, diverticulitis, cholecystitis, pancreatitis, or obstruction/perforation.  Concern for PID in this otherwise healthy female with pelvic pain, large amount of vaginal discharge, and trichomonas noted on wet prep.  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 Fredderick Phenix- in agreement. Appears appropriate for trial of outpatient abx, not septic appearing.  OB/GYN follow-up.  We discussed the need to inform all sexual partners, need for abstinence for 2 weeks to prevent reinfection, and importance of protection when sexually active. I discussed results, treatment plan, need for follow-up, and return precautions with the patient. Provided opportunity for questions, patient confirmed understanding and is in agreement with plan.    Final Clinical Impressions(s) / ED Diagnoses   Final diagnoses:  Pelvic inflammatory disease    ED Discharge Orders         Ordered    doxycycline (VIBRAMYCIN) 100 MG capsule  2 times daily     03/20/18 1950    metroNIDAZOLE (FLAGYL) 500 MG tablet  2 times daily     03/20/18 1950    naproxen  (NAPROSYN) 500 MG tablet  2 times daily     03/20/18 [address] 03/20/18 2004    Rolan Bucco, MD 03/20/18 2036

## 2018-03-20 NOTE — ED Triage Notes (Signed)
Lower abdominal pain into her back x 3 days.

## 2018-03-20 NOTE — Discharge Instructions (Addendum)
You were seen in the emergency department today for abdominal pain.  Your ultrasound was normal.  Your labs were generally reassuring.  Your wet prep, the swab we took during your pelvic exam, showed findings consistent with bacterial vaginosis as well as Trichomonas.  Bacterial vaginosis is not a sexually transmitted disease.  Trichomonas is an STD.  We are concerned you may have pelvic inflammatory disease, please see the attached handout, we are treating you with antibiotics for this.  We are also sending you home with naproxen to help with pain.  We have prescribed you new medication(s) today. Discuss the medications prescribed today with your pharmacist as they can have adverse effects and interactions with your other medicines including over the counter and prescribed medications. Seek medical evaluation if you start to experience new or abnormal symptoms after taking one of these medicines, seek care immediately if you start to experience difficulty breathing, feeling of your throat closing, facial swelling, or rash as these could be indications of a more serious allergic reaction   Please take all medications as prescribed.    You will need to inform all sexual partners of positive test results.  We are also testing you for gonorrhea, chlamydia, HIV, and syphilis, if any of these test returned positive you will need to seek care at the health department, you will also need to inform all sexual partners of these test as well.  Follow up with women's health within 3 to 5 days for reevaluation.  Return to the ER for new or worsening symptoms including but not limited to worsening pain, fever, inability to keep fluids down, or any other concerns.

## 2018-03-21 LAB — HIV ANTIBODY (ROUTINE TESTING W REFLEX): HIV Screen 4th Generation wRfx: NONREACTIVE

## 2018-03-21 LAB — URINE CULTURE: Culture: NO GROWTH

## 2018-03-21 LAB — RPR: RPR Ser Ql: NONREACTIVE

## 2018-03-22 LAB — GC/CHLAMYDIA PROBE AMP (~~LOC~~) NOT AT ARMC
Chlamydia: NEGATIVE
Neisseria Gonorrhea: NEGATIVE

## 2018-03-25 ENCOUNTER — Ambulatory Visit: Payer: 59

## 2018-03-27 ENCOUNTER — Encounter: Payer: 59 | Admitting: Physical Therapy

## 2018-03-28 ENCOUNTER — Other Ambulatory Visit: Payer: Self-pay

## 2018-03-28 ENCOUNTER — Ambulatory Visit: Payer: 59

## 2018-03-28 DIAGNOSIS — M25561 Pain in right knee: Secondary | ICD-10-CM | POA: Diagnosis not present

## 2018-03-28 DIAGNOSIS — G8929 Other chronic pain: Secondary | ICD-10-CM | POA: Diagnosis not present

## 2018-03-28 DIAGNOSIS — R29898 Other symptoms and signs involving the musculoskeletal system: Secondary | ICD-10-CM | POA: Diagnosis not present

## 2018-03-28 NOTE — Therapy (Signed)
[facility] [address]  [address] [place], [place], [address] Phone: [phone]   Fax:  [phone]  Physical Therapy Treatment  Patient Details  Name: Morgan Oneal MRN: [id number] Date of Birth: [birth year] Referring Provider (PT): Doneen Poisson, MD   Encounter Date: 03/28/2018  PT End of Session - 03/28/18 0943    Visit Number  4    Number of Visits  13    Date for PT Re-Evaluation  04/22/18    Authorization Type  [facility]    PT Start Time  0939    PT Stop Time  1020    PT Time Calculation (min)  41 min    Activity Tolerance  Patient tolerated treatment well    Behavior During Therapy  [facility] for tasks assessed/performed       Past Medical History:  Diagnosis Date  . Asthma   . Depression     Past Surgical History:  Procedure Laterality Date  . PARTIAL HYSTERECTOMY  2015    There were no vitals filed for this visit.  Subjective Assessment - 03/28/18 0942    Subjective  Doing well today.  Notes she has been applying taping to R knee with less "crunching" on stairs navigation recently.    Pertinent History  depression, asthma, R knee arthroscopy with lateral release 2015    Diagnostic tests  02/22/18 R knee MRI: Mild patellar cartilage degeneration as described above. No definite meniscal tear. However, there is a 6 x 18 x 7 mm loculated cystic lesion behind the medial meniscus posterior horn and root.     Patient Stated Goals  strengthening the knee to return to the gym    Currently in Pain?  No/denies    Pain Score  0-No pain    Multiple Pain Sites  No                       [facility] Adult PT Treatment/Exercise - 03/28/18 1003      Knee/Hip Exercises: Stretches   Higher education careers adviser reps;30 seconds    Quad Stretch Limitations  prone with strap + bolster under thigh     Hip Flexor Stretch  Right;2 reps;30 seconds    Hip Flexor Stretch Limitations  mod thomas with strap    ITB Stretch   Right;2 reps;30 seconds    ITB Stretch Limitations  strap     Other Knee/Hip Stretches  R KTOS stretch 2 x 30 sec       Knee/Hip Exercises: Aerobic   Recumbent Bike  Lvl 3, 6 min       Knee/Hip Exercises: Standing   Terminal Knee Extension  Right;20 reps;Theraband;Strengthening    Theraband Level (Terminal Knee Extension)  Level 4 (Blue)    Terminal Knee Extension Limitations  TKE with blue TB     Step Down  Right;10 reps;1 set;Step Height: 6";Hand Hold: 2    Step Down Limitations  at machine       Knee/Hip Exercises: Supine   Other Supine Knee/Hip Exercises  B single leg bridge + SLR x 10 reps                PT Short Term Goals - 03/20/18 1715      PT SHORT TERM GOAL #1   Title  Patient to be independent with initial HEP.    Time  3    Period  Weeks    Status  Achieved  Target Date  04/01/18        PT Long Term Goals - 03/18/18 1629      PT LONG TERM GOAL #1   Title  Patient to be independent with advanced HEP.    Time  6    Period  Weeks    Status  On-going      PT LONG TERM GOAL #2   Title  Patient to demonstrate R knee AROM/PROM pain-free and symmetrical to L LE.    Time  6    Period  Weeks    Status  On-going      PT LONG TERM GOAL #3   Title  Patient to demonstrate mild tightness in R TFL.    Time  6    Period  Weeks    Status  On-going      PT LONG TERM GOAL #4   Title  Patient to demonstrate good R quad stability and no pain with stair climbing up/down 13 steps with 1 handrail as needed.     Time  6    Period  Weeks    Status  On-going      PT LONG TERM GOAL #5   Title  Patient to return to gym activities without pain limiting.     Time  6    Period  Weeks    Status  On-going            Plan - 03/28/18 0944    Clinical Impression Statement  Morgan Oneal doing well today and notes daily adherence to HEP.  Tolerated progression of quad/glute strengthening activities well today and HEP updated.  Mild R knee pain present to end session  thus applied ice/compression to R knee with good overall reduction in pain.  Pt. noting she is self-taping with good comfort with this.  Progressing well toward goals.      Personal Factors and Comorbidities  Past/Current Experience;Comorbidity 3+;Time since onset of injury/illness/exacerbation    Comorbidities  depression, asthma, R knee arthroscopy with lateral release     Examination-Activity Limitations  Bend;Sit;Carry;Squat;Stairs;Transfers;Lift;Locomotion Level;Stand    Examination-Participation Restrictions  Community Activity;Shop;Driving;Interpersonal Relationship;Yard Work;Laundry;Meal Prep    Stability/Clinical Decision Making  Stable/Uncomplicated    Rehab Potential  Good    PT Treatment/Interventions  ADLs/Self Care Home Management;Cryotherapy;Electrical Stimulation;Iontophoresis 4mg /ml Dexamethasone;Functional mobility training;Stair training;Gait training;Ultrasound;Moist Heat;Therapeutic activities;Therapeutic exercise;Balance training;Neuromuscular re-education;Patient/family education;Passive range of motion;Manual techniques;Dry needling;Energy conservation;Splinting;Taping;Vasopneumatic Device    PT Next Visit Plan  progress LE stretching and strengthening to tolerance    Consulted and Agree with Plan of Care  Patient       Patient will benefit from skilled therapeutic intervention in order to improve the following deficits and impairments:  Abnormal gait, Decreased activity tolerance, Decreased strength, Increased fascial restricitons, Pain, Difficulty walking, Decreased balance, Decreased range of motion, Improper body mechanics, Postural dysfunction, Impaired flexibility  Visit Diagnosis: Chronic pain of right knee  Other symptoms and signs involving the musculoskeletal system     Problem List Patient Active Problem List   Diagnosis Date Noted  . Urge incontinence 08/01/2017  . Raynaud's disease, idiopathic 07/24/2017    Kermit Balo, PTA 03/28/18 10:44  AM   [facility] [address]  [address] [place], [place], [address] Phone: [phone]   Fax:  [phone]  Name: Morgan Oneal MRN: [id number] Date of Birth: 1985/09/27

## 2018-03-28 NOTE — Patient Instructions (Signed)

## 2018-04-02 ENCOUNTER — Other Ambulatory Visit: Payer: Self-pay

## 2018-04-02 ENCOUNTER — Encounter: Payer: Self-pay | Admitting: Physical Therapy

## 2018-04-02 ENCOUNTER — Ambulatory Visit: Payer: 59 | Attending: Orthopaedic Surgery | Admitting: Physical Therapy

## 2018-04-02 DIAGNOSIS — G8929 Other chronic pain: Secondary | ICD-10-CM | POA: Diagnosis not present

## 2018-04-02 DIAGNOSIS — R29898 Other symptoms and signs involving the musculoskeletal system: Secondary | ICD-10-CM | POA: Insufficient documentation

## 2018-04-02 DIAGNOSIS — M25561 Pain in right knee: Secondary | ICD-10-CM | POA: Diagnosis not present

## 2018-04-02 NOTE — Therapy (Signed)
[facility] [address]  [address] [place], [place], [address] Phone: [phone]   Fax:  [phone]  Physical Therapy Treatment  Patient Details  Name: Morgan Oneal MRN: [id number] Date of Birth: 01/22/1985 Referring Provider (PT): Doneen Poisson, MD   Encounter Date: 04/02/2018  PT End of Session - 04/02/18 1029    Visit Number  5    Number of Visits  13    Date for PT Re-Evaluation  04/22/18    Authorization Type  [facility]    PT Start Time  0939    PT Stop Time  1023    PT Time Calculation (min)  44 min    Activity Tolerance  Patient tolerated treatment well    Behavior During Therapy  [facility] for tasks assessed/performed       Past Medical History:  Diagnosis Date  . Asthma   . Depression     Past Surgical History:  Procedure Laterality Date  . PARTIAL HYSTERECTOMY  2015    There were no vitals filed for this visit.  Subjective Assessment - 04/02/18 0940    Subjective  Reports that she is doing well and not noticing as much crunching in her knee.    Pertinent History  depression, asthma, R knee arthroscopy with lateral release 2015    Diagnostic tests  02/22/18 R knee MRI: Mild patellar cartilage degeneration as described above. No definite meniscal tear. However, there is a 6 x 18 x 7 mm loculated cystic lesion behind the medial meniscus posterior horn and root.     Patient Stated Goals  strengthening the knee to return to the gym    Currently in Pain?  No/denies                       [facility] Adult PT Treatment/Exercise - 04/02/18 0001      Knee/Hip Exercises: Stretches   Hip Flexor Stretch  Right;2 reps;30 seconds    Hip Flexor Stretch Limitations  mod thomas with strap    ITB Stretch  Right;2 reps;30 seconds    ITB Stretch Limitations  strap       Knee/Hip Exercises: Aerobic   Recumbent Bike  Lvl 3, 6 min       Knee/Hip Exercises: Standing   Terminal Knee Extension   Right;Theraband;Strengthening;15 reps    Theraband Level (Terminal Knee Extension)  Level 4 (Blue)    Terminal Knee Extension Limitations  15x3" with 1 UE support on chair    Lateral Step Up  Right;10 reps;2 sets;Hand Hold: 2;Step Height: 4"    Lateral Step Up Limitations  lateral step downs with heel touch; 2nd set with blue TB around R knee; cues required to avoid L hip drop and valgus collapse on R knee    Functional Squat  1 set;10 reps    Functional Squat Limitations  green TB around knees   audible crepitus from R knee   Wall Squat  10 reps;3 seconds    Wall Squat Limitations  ball squeeze at knees    cues to maintain back flat   Other Standing Knee Exercises  R LE lateral TKE with blue TB 15x3"   manual cues for form     Knee/Hip Exercises: Sidelying   Clams  clamshell with green TB at knees 2x10 reps each side   manual cues to avoid trunk rotation     Knee/Hip Exercises: Prone   Hip Extension  Strengthening;Right;Left;1 set;10 reps  Hip Extension Limitations  prone donkey kicks + abduction    cues to avoid rotating spine   Other Prone Exercises  plank on elbows/toes 2x30"   cues for TKE     Manual Therapy   Manual Therapy  Taping    Kinesiotex  Create Space      Kinesiotix   Create Space  R knee chondromalacia taping pattern              PT Education - 04/02/18 1028    Education Details  administered green TB for progression of clamshells    Person(s) Educated  Patient    Methods  Explanation;Demonstration;Tactile cues;Verbal cues    Comprehension  Verbalized understanding;Returned demonstration       PT Short Term Goals - 03/20/18 1715      PT SHORT TERM GOAL #1   Title  Patient to be independent with initial HEP.    Time  3    Period  Weeks    Status  Achieved    Target Date  04/01/18        PT Long Term Goals - 03/18/18 1629      PT LONG TERM GOAL #1   Title  Patient to be independent with advanced HEP.    Time  6    Period  Weeks     Status  On-going      PT LONG TERM GOAL #2   Title  Patient to demonstrate R knee AROM/PROM pain-free and symmetrical to L LE.    Time  6    Period  Weeks    Status  On-going      PT LONG TERM GOAL #3   Title  Patient to demonstrate mild tightness in R TFL.    Time  6    Period  Weeks    Status  On-going      PT LONG TERM GOAL #4   Title  Patient to demonstrate good R quad stability and no pain with stair climbing up/down 13 steps with 1 handrail as needed.     Time  6    Period  Weeks    Status  On-going      PT LONG TERM GOAL #5   Title  Patient to return to gym activities without pain limiting.     Time  6    Period  Weeks    Status  On-going            Plan - 04/02/18 1030    Clinical Impression Statement  Patient reporting continued improvement in R knee pain since starting PT. Able to progress clamshells with increased banded resistance; patient requiring cues to avoid trunk rotation. Progressed prone donkey kicks with addition of hip abduction with patient reporting difficulty and cues required to correct form. Patient with R knee grinding and crepitus with squats and lateral step down, however no audible crepitus or pain with wall squats with ball squeeze. Patient required cues to maintain level pelvis and avoid valgus collapse of R knee with lateral step downs. Ended session with KT tape to R knee for continued pain relief. Administered green loop to patient in order to progress exercises at home. Patient reported understanding. No complaints at end of session.    Comorbidities  depression, asthma, R knee arthroscopy with lateral release     PT Treatment/Interventions  ADLs/Self Care Home Management;Cryotherapy;Electrical Stimulation;Iontophoresis /ml Dexamethasone;Functional mobility training;Stair training;Gait training;Ultrasound;Moist Heat;Therapeutic activities;Therapeutic exercise;Balance training;Neuromuscular re-education;Patient/family education;Passive range  of motion;Manual techniques;Dry needling;Energy  conservation;Splinting;Taping;Vasopneumatic Device    PT Next Visit Plan  progress LE stretching and strengthening to tolerance    Consulted and Agree with Plan of Care  Patient       Patient will benefit from skilled therapeutic intervention in order to improve the following deficits and impairments:  Abnormal gait, Decreased activity tolerance, Decreased strength, Increased fascial restricitons, Pain, Difficulty walking, Decreased balance, Decreased range of motion, Improper body mechanics, Postural dysfunction, Impaired flexibility  Visit Diagnosis: Chronic pain of right knee  Other symptoms and signs involving the musculoskeletal system     Problem List Patient Active Problem List   Diagnosis Date Noted  . Urge incontinence 08/01/2017  . Raynaud's disease, idiopathic 07/24/2017    Anette Guarneri, PT, DPT 04/02/18 10:35 AM   [facility] [address]  [address] [place], [place], [address] Phone: [phone]   Fax:  [phone]  Name: Morgan Oneal MRN: [id number] Date of Birth: Sep 27, 1985

## 2018-04-03 ENCOUNTER — Encounter: Payer: 59 | Admitting: Physical Therapy

## 2018-04-05 ENCOUNTER — Encounter: Payer: Self-pay | Admitting: Family Medicine

## 2018-04-09 ENCOUNTER — Encounter: Payer: Self-pay | Admitting: Family Medicine

## 2018-04-09 ENCOUNTER — Ambulatory Visit: Payer: 59

## 2018-04-09 ENCOUNTER — Ambulatory Visit (INDEPENDENT_AMBULATORY_CARE_PROVIDER_SITE_OTHER): Payer: 59 | Admitting: Family Medicine

## 2018-04-09 ENCOUNTER — Other Ambulatory Visit: Payer: Self-pay

## 2018-04-09 VITALS — Temp 98.9°F | Ht 67.0 in | Wt 180.0 lb

## 2018-04-09 DIAGNOSIS — J0110 Acute frontal sinusitis, unspecified: Secondary | ICD-10-CM | POA: Diagnosis not present

## 2018-04-09 DIAGNOSIS — J029 Acute pharyngitis, unspecified: Secondary | ICD-10-CM

## 2018-04-09 MED ORDER — AZITHROMYCIN 250 MG PO TABS: ORAL_TABLET | ORAL | 0 refills | Status: DC

## 2018-04-09 NOTE — Progress Notes (Signed)
Patient: Morgan Oneal MRN: [id number] DOB: 03-26-1985 PCP: Orland MustardWolfe, Davi Kroon, MD     I connected with Morgan Oneal on 04/09/18 at 8:10am by a video enabled telemedicine application and verified that I am speaking with the correct person using two identifiers.  Location patient: Home Location provider: [facility], Office Persons participating in this virtual visit: Morgan Oneal and Dr. Artis FlockWolfe   I discussed the limitations of evaluation and management by telemedicine and the availability of in person appointments. The patient expressed understanding and agreed to proceed.   Attempted to do virtual visit. Video worked, but audio did not. Used phone for audio. Completed visit with audio via telephone.   Subjective:  Chief Complaint  Patient presents with  . Sore Throat  . Headache    HPI: The patient is a 33 y.o. female who presents today for sore throat and headache. Symptoms started about 6 days ago. Her lymph nodes are swollen and tender to touch. She has had temperature to 99.0. She states when symptoms started she had some drainage and Friday morning she woke up with swollen lymph nodes, ear pain and sore throat. She states her symptoms have just progressed. She denies any cough or shortness of breath. All weekend her ears have really been hurting. She does work at [employer] and works all over. She has not lost her sense of smell or taste. She denies any nausea/diarrhea. She has a lot of pressure in her sinuses and has a foul smell. She has used zyrtec and claritin only and some tylenol. She denies any sick contacts at home. She denies any difficulty swallowing and is eating and drinking well.   Review of Systems  Constitutional: Positive for fever. Negative for chills and fatigue.  HENT: Positive for ear pain, sinus pressure and sore throat. Negative for congestion, rhinorrhea, sinus pain and trouble swallowing.   Eyes: Positive for photophobia. Negative for pain.   Respiratory: Negative for cough and shortness of breath.   Cardiovascular: Negative for chest pain.  Gastrointestinal: Negative for abdominal pain, diarrhea, nausea and vomiting.  Musculoskeletal: Positive for neck pain. Negative for arthralgias, back pain and myalgias.  Neurological: Positive for headaches. Negative for dizziness.  Hematological: Positive for adenopathy.    Allergies Patient is allergic to mucinex [guaifenesin er] and penicillins.  Past Medical History Patient  has a past medical history of Asthma and Depression.  Surgical History Patient  has a past surgical history that includes Partial hysterectomy (2015).  Family History Pateint's family history includes Alcohol abuse in her paternal grandfather; Arthritis in her father, maternal grandmother, and paternal grandmother; Asthma in her paternal grandmother; Cancer in her paternal grandmother; Diabetes in her maternal grandmother.  Social History Patient  reports that she has never smoked. She has never used smokeless tobacco. She reports current alcohol use. She reports that she does not use drugs.    Objective: Vitals:   04/09/18 0800  Temp: 98.9 F (37.2 C)  TempSrc: Oral  Weight: 180 lb (81.6 kg)  Height: 5\' 7"  (1.702 m)    Body mass index is 28.19 kg/m.  Physical Exam Vitals signs reviewed.  Constitutional:      Appearance: She is well-developed.  HENT:     Head: Normocephalic and atraumatic.  Pulmonary:     Effort: Pulmonary effort is normal.  Neurological:     General: No focal deficit present.     Mental Status: She is alert and oriented to person, place, and time.  Assessment/plan: 1. Sore throat Could be strep. Treating with zpack since anaphylaxis to PCN. Discussed there is resistance and to let me know if any issues or symptoms do not resolve. Also could be PND from sinus issues. Continue flonase nightly.   2. Subacute frontal sinusitis Discussed using zpack for possible  strep which is not recommended for sinus infection due to resistance, but we will see how she does and if not better will change to doxy s/p completion of zpack for possible strep.   Note for work will be sent via [employer]. May return to work when symptom free x3 days.   Return if symptoms worsen or fail to improve.    [name], MD [facility]  04/09/2018

## 2018-04-10 ENCOUNTER — Encounter: Payer: 59 | Admitting: Physical Therapy

## 2018-04-11 ENCOUNTER — Other Ambulatory Visit: Payer: Self-pay

## 2018-04-11 ENCOUNTER — Ambulatory Visit: Payer: 59 | Admitting: Physical Therapy

## 2018-04-11 ENCOUNTER — Encounter: Payer: Self-pay | Admitting: Physical Therapy

## 2018-04-11 DIAGNOSIS — G8929 Other chronic pain: Secondary | ICD-10-CM

## 2018-04-11 DIAGNOSIS — R29898 Other symptoms and signs involving the musculoskeletal system: Secondary | ICD-10-CM

## 2018-04-11 DIAGNOSIS — M25561 Pain in right knee: Secondary | ICD-10-CM | POA: Diagnosis not present

## 2018-04-11 NOTE — Therapy (Signed)
[facility] [address]  [address] [place], [place], [address] Phone: [phone]   Fax:  [phone]  Physical Therapy Treatment  Patient Details  Name: Morgan Oneal MRN: [id number] Date of Birth: [birth year] Referring Provider (PT): Doneen Poisson, MD   Encounter Date: 04/11/2018  PT End of Session - 04/11/18 1025    Visit Number  6    Number of Visits  13    Date for PT Re-Evaluation  04/22/18    Authorization Type  [facility]    PT Start Time  0952    PT Stop Time  1036    PT Time Calculation (min)  44 min    Activity Tolerance  Patient tolerated treatment well    Behavior During Therapy  [facility] for tasks assessed/performed       Past Medical History:  Diagnosis Date  . Asthma   . Depression     Past Surgical History:  Procedure Laterality Date  . PARTIAL HYSTERECTOMY  2015    There were no vitals filed for this visit.  Subjective Assessment - 04/11/18 0953    Subjective  Reports that she is doing well and knee has been feeling good. Requesting to use vasopneumatic device this session.    Pertinent History  depression, asthma, R knee arthroscopy with lateral release 2015    Diagnostic tests  02/22/18 R knee MRI: Mild patellar cartilage degeneration as described above. No definite meniscal tear. However, there is a 6 x 18 x 7 mm loculated cystic lesion behind the medial meniscus posterior horn and root.     Patient Stated Goals  strengthening the knee to return to the gym    Currently in Pain?  No/denies                       [facility] Adult PT Treatment/Exercise - 04/11/18 0001      Knee/Hip Exercises: Aerobic   Recumbent Bike  Lvl 3, 6 min       Knee/Hip Exercises: Standing   Functional Squat  2 sets;10 reps   cues to activate lateral hips and shift weight to heels   Functional Squat Limitations  to airex pad; 2nd set with red TB    SLS  R RDL to touch bolster on mat x10   heavy cues for  form   Other Standing Knee Exercises  sidestepping with red TB around toes 2x41ft   cues to maintain soft knee bend   Other Standing Knee Exercises  anterior/posterior monster walk with red TB around ankles 2x21ft      Knee/Hip Exercises: Supine   Single Leg Bridge  Strengthening;Right;Left;Both;1 set;10 reps      Modalities   Modalities  Vasopneumatic      Vasopneumatic   Number Minutes Vasopneumatic   15 minutes    Vasopnuematic Location   Knee   R   Vasopneumatic Pressure  Medium    Vasopneumatic Temperature   coldest             PT Education - 04/11/18 1025    Education Details  updated to HEP    Person(s) Educated  Patient    Methods  Explanation;Demonstration;Tactile cues;Verbal cues;Handout    Comprehension  Verbalized understanding;Returned demonstration       PT Short Term Goals - 03/20/18 1715      PT SHORT TERM GOAL #1   Title  Patient to be independent with initial HEP.    Time  3    Period  Weeks    Status  Achieved    Target Date  04/01/18        PT Long Term Goals - 03/18/18 1629      PT LONG TERM GOAL #1   Title  Patient to be independent with advanced HEP.    Time  6    Period  Weeks    Status  On-going      PT LONG TERM GOAL #2   Title  Patient to demonstrate R knee AROM/PROM pain-free and symmetrical to L LE.    Time  6    Period  Weeks    Status  On-going      PT LONG TERM GOAL #3   Title  Patient to demonstrate mild tightness in R TFL.    Time  6    Period  Weeks    Status  On-going      PT LONG TERM GOAL #4   Title  Patient to demonstrate good R quad stability and no pain with stair climbing up/down 13 steps with 1 handrail as needed.     Time  6    Period  Weeks    Status  On-going      PT LONG TERM GOAL #5   Title  Patient to return to gym activities without pain limiting.     Time  6    Period  Weeks    Status  On-going            Plan - 04/11/18 1025    Clinical Impression Statement  Patient arrived to  session late with report that she overslept. Worked on progressive hip strengthening ther-ex with banded resistance and good effort to maintain correct form. Patient given cues to shift weight over heels to avoid anterior tibial translation and promote lateral hip activation with squats. Patient with difficulty with understanding of hip ER activation, thus used red banded resistance around knees for this cue. Patient with difficulty maintaining balance and performing single leg RDL correctly on R LE. Reported mild pain after ther-ex, thus received Game[employer] to R knee at end of session. Updated HEP with sidestepping with banded resistance- patient reported understanding. No complaints at end of session.     Comorbidities  depression, asthma, R knee arthroscopy with lateral release     PT Treatment/Interventions  ADLs/Self Care Home Management;Cryotherapy;Electrical Stimulation;Iontophoresis 4mg /ml Dexamethasone;Functional mobility training;Stair training;Gait training;Ultrasound;Moist Heat;Therapeutic activities;Therapeutic exercise;Balance training;Neuromuscular re-education;Patient/family education;Passive range of motion;Manual techniques;Dry needling;Energy conservation;Splinting;Taping;Vasopneumatic Device    PT Next Visit Plan  progress LE stretching and strengthening to tolerance    Consulted and Agree with Plan of Care  Patient       Patient will benefit from skilled therapeutic intervention in order to improve the following deficits and impairments:  Abnormal gait, Decreased activity tolerance, Decreased strength, Increased fascial restricitons, Pain, Difficulty walking, Decreased balance, Decreased range of motion, Improper body mechanics, Postural dysfunction, Impaired flexibility  Visit Diagnosis: Chronic pain of right knee  Other symptoms and signs involving the musculoskeletal system     Problem List Patient Active Problem List   Diagnosis Date Noted  . Urge incontinence 08/01/2017   . Raynaud's disease, idiopathic 07/24/2017     Anette GuarneriYevgeniya Apphia Cropley, PT, DPT 04/11/18 10:41 AM   [facility] [address]  [address] [place], [place], [address] Phone: [phone]   Fax:  [phone]  Name: Teodoro KilChristina L Shonk MRN: [id number] Date of Birth: 1985-11-05

## 2018-04-16 ENCOUNTER — Ambulatory Visit: Payer: 59

## 2018-04-16 ENCOUNTER — Other Ambulatory Visit: Payer: Self-pay

## 2018-04-16 DIAGNOSIS — R29898 Other symptoms and signs involving the musculoskeletal system: Secondary | ICD-10-CM | POA: Diagnosis not present

## 2018-04-16 DIAGNOSIS — G8929 Other chronic pain: Secondary | ICD-10-CM

## 2018-04-16 DIAGNOSIS — M25561 Pain in right knee: Principal | ICD-10-CM

## 2018-04-16 NOTE — Therapy (Signed)
[facility] [address]  [place], [place], [address] Phone: [phone]   Fax:  [phone]  Physical Therapy Treatment  Patient Details  Name: Morgan Oneal MRN: [id number] Date of Birth: August 28, 1985 Referring Provider (PT): Jean Rosenthal, MD   Encounter Date: 04/16/2018  PT End of Session - 04/16/18 0954    Visit Number  7    Number of Visits  13    Date for PT Re-Evaluation  04/22/18    Authorization Type  [facility]    PT Start Time  0950    PT Stop Time  1050    PT Time Calculation (min)  60 min    Activity Tolerance  Patient tolerated treatment well    Behavior During Therapy  [facility] for tasks assessed/performed       Past Medical History:  Diagnosis Date  . Asthma   . Depression     Past Surgical History:  Procedure Laterality Date  . PARTIAL HYSTERECTOMY  2015    There were no vitals filed for this visit.  Subjective Assessment - 04/16/18 0952    Subjective  Pt. reporting her knee has been feeling better recently.  reports getting knee pain most often with descending stairs and squats still.      Pertinent History  depression, asthma, R knee arthroscopy with lateral release 2015    Diagnostic tests  02/22/18 R knee MRI: Mild patellar cartilage degeneration as described above. No definite meniscal tear. However, there is a 6 x 18 x 7 mm loculated cystic lesion behind the medial meniscus posterior horn and root.     Patient Stated Goals  strengthening the knee to return to the gym    Currently in Pain?  No/denies    Pain Score  0-No pain    Multiple Pain Sites  No                       [facility] Adult PT Treatment/Exercise - 04/16/18 1010      Knee/Hip Exercises: Stretches   Hip Flexor Stretch  Right;2 reps;30 seconds    Hip Flexor Stretch Limitations  mod thomas with strap    ITB Stretch  Right;2 reps;30 seconds    ITB Stretch Limitations  strap     Other Knee/Hip Stretches   Half kneeling lunge stretch with chair for hip flexor stretch x 30 sec each way       Knee/Hip Exercises: Aerobic   Recumbent Bike  Lvl 3, 6 min       Knee/Hip Exercises: Standing   Step Down  Right;10 reps;1 set;Step Height: 6";Hand Hold: 2    Step Down Limitations  at machine     SLS  R RDL to touch bolster on mat x10   Cues for slow performance    Other Standing Knee Exercises  sidestepping with green TB around toes 2x73f    Other Standing Knee Exercises  Monster walk forward/backwards with green TB at ankles 2 x 30 ft each      Knee/Hip Exercises: Supine   Single Leg Bridge  Right;Left;10 reps;Strengthening   with green TB at knees with isometric hip abd/ER      Manual Therapy   Manual Therapy  Passive ROM    Manual therapy comments  sidelying     Passive ROM  R manual ITB, TFL stretch x 30 sec              PT  Education - 04/16/18 1055    Education Details  HEP update    Person(s) Educated  Patient    Methods  Explanation;Demonstration;Verbal cues;Handout    Comprehension  Verbalized understanding;Returned demonstration;Verbal cues required;Need further instruction       PT Short Term Goals - 03/20/18 1715      PT SHORT TERM GOAL #1   Title  Patient to be independent with initial HEP.    Time  3    Period  Weeks    Status  Achieved    Target Date  04/01/18        PT Long Term Goals - 04/16/18 1109      PT LONG TERM GOAL #1   Title  Patient to be independent with advanced HEP.    Time  6    Period  Weeks    Status  On-going      PT LONG TERM GOAL #2   Title  Patient to demonstrate R knee AROM/PROM pain-free and symmetrical to L LE.    Time  6    Period  Weeks    Status  On-going      PT LONG TERM GOAL #3   Title  Patient to demonstrate mild tightness in R TFL.    Time  6    Period  Weeks    Status  On-going      PT LONG TERM GOAL #4   Title  Patient to demonstrate good R quad stability and no pain with stair climbing up/down 13 steps with 1  handrail as needed.     Time  6    Period  Weeks    Status  Partially Met   04/16/18: Pt. able to ascend/descend stairs without handout rail reciprocally without pain however still with visible knee valgus and some limitation with R quad eccentric control      PT LONG TERM GOAL #5   Title  Patient to return to gym activities without pain limiting.     Time  6    Period  Weeks    Status  On-going            Plan - 04/16/18 0955    Clinical Impression Statement  Pt. reporting she has been performing side stepping with band at home twice since last visit without issue.  able to demo progress toward LTG #4 today demonstrating improved tolerance for stair navigation however still with visible proximal hip and R quad eccentric weakness visible.  Session focused on proximal hip strengthening/glute strengthening and LE flexibility.  Pt. tolerated all activities well in session however ended session with complaint of low-level R knee pain thus applied ice/compression to reduce post-exercise swelling and pain.  HEP updated with single leg bridge as pt. able to demo good overall technique after instruction for full ROM + hold at top of movement.  Will continue to progress toward goals.      Personal Factors and Comorbidities  Past/Current Experience;Comorbidity 3+;Time since onset of injury/illness/exacerbation    Comorbidities  depression, asthma, R knee arthroscopy with lateral release     Examination-Activity Limitations  Bend;Sit;Carry;Squat;Stairs;Transfers;Lift;Locomotion Level;Stand    Examination-Participation Restrictions  Community Activity;Shop;Driving;Interpersonal Relationship;Yard Work;Laundry;Meal Prep    Stability/Clinical Decision Making  Stable/Uncomplicated    Rehab Potential  Good    PT Treatment/Interventions  ADLs/Self Care Home Management;Cryotherapy;Electrical Stimulation;Iontophoresis 25m/ml Dexamethasone;Functional mobility training;Stair training;Gait  training;Ultrasound;Moist Heat;Therapeutic activities;Therapeutic exercise;Balance training;Neuromuscular re-education;Patient/family education;Passive range of motion;Manual techniques;Dry needling;Energy conservation;Splinting;Taping;Vasopneumatic Device    PT Next Visit  Plan  progress LE stretching and strengthening to tolerance    Consulted and Agree with Plan of Care  Patient       Patient will benefit from skilled therapeutic intervention in order to improve the following deficits and impairments:  Abnormal gait, Decreased activity tolerance, Decreased strength, Increased fascial restricitons, Pain, Difficulty walking, Decreased balance, Decreased range of motion, Improper body mechanics, Postural dysfunction, Impaired flexibility  Visit Diagnosis: Chronic pain of right knee  Other symptoms and signs involving the musculoskeletal system     Problem List Patient Active Problem List   Diagnosis Date Noted  . Urge incontinence 08/01/2017  . Raynaud's disease, idiopathic 07/24/2017    Bess Harvest, PTA 04/16/18 11:17 AM   [facility] [address]  [place], [place], [address] Phone: [phone]   Fax:  [phone]  Name: Morgan Oneal MRN: [id number] Date of Birth: Sep 18, 1985

## 2018-04-18 ENCOUNTER — Other Ambulatory Visit: Payer: Self-pay

## 2018-04-18 ENCOUNTER — Ambulatory Visit: Payer: 59

## 2018-04-18 DIAGNOSIS — R29898 Other symptoms and signs involving the musculoskeletal system: Secondary | ICD-10-CM

## 2018-04-18 DIAGNOSIS — M25561 Pain in right knee: Principal | ICD-10-CM

## 2018-04-18 DIAGNOSIS — G8929 Other chronic pain: Secondary | ICD-10-CM

## 2018-04-18 NOTE — Therapy (Addendum)
[facility] [address]  [place], [place], [address] Phone: [phone]   Fax:  [phone]  Physical Therapy Treatment  Patient Details  Name: Morgan Oneal MRN: [id number] Date of Birth: 10/07/1985 Referring Provider (PT): Jean Rosenthal, MD   Encounter Date: 04/18/2018  PT End of Session - 04/18/18 0957    Visit Number  8    Number of Visits  13    Date for PT Re-Evaluation  04/22/18    Authorization Type  [facility]    PT Start Time  0953    PT Stop Time  1031    PT Time Calculation (min)  38 min    Activity Tolerance  Patient tolerated treatment well    Behavior During Therapy  [facility] for tasks assessed/performed       Past Medical History:  Diagnosis Date  . Asthma   . Depression     Past Surgical History:  Procedure Laterality Date  . PARTIAL HYSTERECTOMY  2015    There were no vitals filed for this visit.  Subjective Assessment - 04/18/18 1003    Subjective  Pt. reporting she had some muscular soreness after last session.  Wishes to get consolidated HEP today.      Pertinent History  depression, asthma, R knee arthroscopy with lateral release 2015    Diagnostic tests  02/22/18 R knee MRI: Mild patellar cartilage degeneration as described above. No definite meniscal tear. However, there is a 6 x 18 x 7 mm loculated cystic lesion behind the medial meniscus posterior horn and root.     Patient Stated Goals  strengthening the knee to return to the gym    Currently in Pain?  No/denies    Pain Score  0-No pain    Multiple Pain Sites  No                       [facility] Adult PT Treatment/Exercise - 04/18/18 1007      Self-Care   Self-Care  Other Self-Care Comments    Other Self-Care Comments   Discussion of comprehensive HEP and discussion of consolidated HEP as pt. requesting this       Knee/Hip Exercises: Aerobic   Recumbent Bike  Lvl 3, 6 min       Knee/Hip Exercises: Standing    Step Down  15 reps;Right;1 set;Step Height: 2"    Step Down Limitations  at machine     SLS  R RDL to touch bolster on mat 2 x 5; cues for 5" performance slow pacing     Other Standing Knee Exercises  Monster walk forward/backwards with green TB at ankles 2 x 40 ft each      Knee/Hip Exercises: Supine   Single Leg Bridge  Right;Left;10 reps;Strengthening   with opposite UE holding knee into SKTC stretch pos   Straight Leg Raises  Right;15 reps    Straight Leg Raises Limitations  2#    Straight Leg Raise with External Rotation  Right;15 reps    Straight Leg Raise with External Rotation Limitations  2#      Knee/Hip Exercises: Prone   Other Prone Exercises  Qaudruped alternating LE/UE raise x 15 reps              PT Education - 04/18/18 1051    Education Details  HEP update; concolidated HEP as pt. requesting this     Person(s) Educated  Patient  Methods  Explanation;Demonstration;Verbal cues;Handout    Comprehension  Verbalized understanding;Returned demonstration;Verbal cues required;Need further instruction       PT Short Term Goals - 03/20/18 1715      PT SHORT TERM GOAL #1   Title  Patient to be independent with initial HEP.    Time  3    Period  Weeks    Status  Achieved    Target Date  04/01/18        PT Long Term Goals - 04/16/18 1109      PT LONG TERM GOAL #1   Title  Patient to be independent with advanced HEP.    Time  6    Period  Weeks    Status  On-going      PT LONG TERM GOAL #2   Title  Patient to demonstrate R knee AROM/PROM pain-free and symmetrical to L LE.    Time  6    Period  Weeks    Status  On-going      PT LONG TERM GOAL #3   Title  Patient to demonstrate mild tightness in R TFL.    Time  6    Period  Weeks    Status  On-going      PT LONG TERM GOAL #4   Title  Patient to demonstrate good R quad stability and no pain with stair climbing up/down 13 steps with 1 handrail as needed.     Time  6    Period  Weeks    Status   Partially Met   04/16/18: Pt. able to ascend/descend stairs without handout rail reciprocally without pain however still with visible knee valgus and some limitation with R quad eccentric control      PT LONG TERM GOAL #5   Title  Patient to return to gym activities without pain limiting.     Time  6    Period  Weeks    Status  On-going            Plan - 04/18/18 0957    Clinical Impression Statement  Pt. reporting she has been trying to be aware of "keeping R knee outward" when descending stairs.  Pt. noting 60-70% improvement in R knee pain since starting therapy.   Session focused on consolidation of current HEP per pt. request.  Pt. tolerated all LE strengthening activities well today.  Updated HEP handout issued to pt.    Personal Factors and Comorbidities  Past/Current Experience;Comorbidity 3+;Time since onset of injury/illness/exacerbation    Comorbidities  depression, asthma, R knee arthroscopy with lateral release     Examination-Activity Limitations  Bend;Sit;Carry;Squat;Stairs;Transfers;Lift;Locomotion Level;Stand    Examination-Participation Restrictions  Community Activity;Shop;Driving;Interpersonal Relationship;Yard Work;Laundry;Meal Prep    Stability/Clinical Decision Making  Stable/Uncomplicated    Rehab Potential  Good    PT Treatment/Interventions  ADLs/Self Care Home Management;Cryotherapy;Electrical Stimulation;Iontophoresis 30m/ml Dexamethasone;Functional mobility training;Stair training;Gait training;Ultrasound;Moist Heat;Therapeutic activities;Therapeutic exercise;Balance training;Neuromuscular re-education;Patient/family education;Passive range of motion;Manual techniques;Dry needling;Energy conservation;Splinting;Taping;Vasopneumatic Device    PT Next Visit Plan  progress LE stretching and strengthening to tolerance    Consulted and Agree with Plan of Care  Patient       Patient will benefit from skilled therapeutic intervention in order to improve the following  deficits and impairments:  Abnormal gait, Decreased activity tolerance, Decreased strength, Increased fascial restricitons, Pain, Difficulty walking, Decreased balance, Decreased range of motion, Improper body mechanics, Postural dysfunction, Impaired flexibility  Visit Diagnosis: Chronic pain of right knee  Other symptoms and signs  involving the musculoskeletal system     Problem List Patient Active Problem List   Diagnosis Date Noted  . Urge incontinence 08/01/2017  . Raynaud's disease, idiopathic 07/24/2017    Bess Harvest, PTA 04/18/18 10:52 AM   [facility] [address]  [place], [place], [address] Phone: [phone]   Fax:  [phone]  Name: Morgan Oneal MRN: [id number] Date of Birth: 01-Sep-1985

## 2018-04-21 ENCOUNTER — Ambulatory Visit: Payer: 59 | Admitting: Neurology

## 2018-04-22 ENCOUNTER — Encounter: Payer: 59 | Admitting: Physical Therapy

## 2018-04-23 ENCOUNTER — Other Ambulatory Visit: Payer: Self-pay

## 2018-04-23 ENCOUNTER — Ambulatory Visit: Payer: 59

## 2018-04-23 DIAGNOSIS — M25561 Pain in right knee: Principal | ICD-10-CM

## 2018-04-23 DIAGNOSIS — R29898 Other symptoms and signs involving the musculoskeletal system: Secondary | ICD-10-CM | POA: Diagnosis not present

## 2018-04-23 DIAGNOSIS — G8929 Other chronic pain: Secondary | ICD-10-CM

## 2018-04-23 NOTE — Therapy (Signed)
[facility] [address]  [place], [place], [address] Phone: [phone]   Fax:  [phone]  Physical Therapy Treatment  Patient Details  Name: Morgan Oneal MRN: [id number] Date of Birth: December 07, 1985 Referring Provider (PT): Jean Rosenthal, MD   Encounter Date: 04/23/2018  PT End of Session - 04/23/18 1238    Visit Number  9    Number of Visits  13    Date for PT Re-Evaluation  04/22/18    Authorization Type  [facility]    PT Start Time  1233    PT Stop Time  1311    PT Time Calculation (min)  38 min    Activity Tolerance  Patient tolerated treatment well    Behavior During Therapy  [facility] for tasks assessed/performed       Past Medical History:  Diagnosis Date  . Asthma   . Depression     Past Surgical History:  Procedure Laterality Date  . PARTIAL HYSTERECTOMY  2015    There were no vitals filed for this visit.  Subjective Assessment - 04/23/18 1240    Subjective  Pt. reporting no issues with updated consolidated HEP.      Pertinent History  depression, asthma, R knee arthroscopy with lateral release 2015    How long can you sit comfortably?  4 hours     How long can you stand comfortably?  not limited     How long can you walk comfortably?  half the day     Diagnostic tests  02/22/18 R knee MRI: Mild patellar cartilage degeneration as described above. No definite meniscal tear. However, there is a 6 x 18 x 7 mm loculated cystic lesion behind the medial meniscus posterior horn and root.     Patient Stated Goals  strengthening the knee to return to the gym    Currently in Pain?  No/denies    Pain Score  0-No pain   Pain rising to 8/10 at worst after prolonged sitting   Pain Location  Knee    Pain Orientation  Right;Medial;Lateral;Lower    Pain Descriptors / Indicators  Burning    Pain Type  Chronic pain    Aggravating Factors   Prolonged sitting     Multiple Pain Sites  No                        [facility] Adult PT Treatment/Exercise - 04/23/18 1235      Knee/Hip Exercises: Stretches   Hip Flexor Stretch  Right;1 rep;60 seconds    Hip Flexor Stretch Limitations  mod thomas with strap      Knee/Hip Exercises: Aerobic   Recumbent Bike  Lvl 3, 6 min       Knee/Hip Exercises: Standing   Hip Flexion  Right;Left;10 reps;Knee straight;Stengthening    Hip Flexion Limitations  red TB at ankle    Hip ADduction  Right;Left;10 reps;Strengthening    Hip ADduction Limitations  red TB at ankle    Hip Abduction  Right;Left;10 reps;Knee straight;Stengthening    Abduction Limitations  Red TB at ankle    Hip Extension  Right;Left;10 reps;Knee straight;Stengthening    Extension Limitations  red TB at ankle    Wall Squat  15 reps;5 seconds    Wall Squat Limitations  ball squeeze at knees       Knee/Hip Exercises: Prone   Other Prone Exercises  plank on elbows/toes 2x30"  PT Short Term Goals - 03/20/18 1715      PT SHORT TERM GOAL #1   Title  Patient to be independent with initial HEP.    Time  3    Period  Weeks    Status  Achieved    Target Date  04/01/18        PT Long Term Goals - 04/16/18 1109      PT LONG TERM GOAL #1   Title  Patient to be independent with advanced HEP.    Time  6    Period  Weeks    Status  On-going      PT LONG TERM GOAL #2   Title  Patient to demonstrate R knee AROM/PROM pain-free and symmetrical to L LE.    Time  6    Period  Weeks    Status  On-going      PT LONG TERM GOAL #3   Title  Patient to demonstrate mild tightness in R TFL.    Time  6    Period  Weeks    Status  On-going      PT LONG TERM GOAL #4   Title  Patient to demonstrate good R quad stability and no pain with stair climbing up/down 13 steps with 1 handrail as needed.     Time  6    Period  Weeks    Status  Partially Met   04/16/18: Pt. able to ascend/descend stairs without handout rail reciprocally without pain however  still with visible knee valgus and some limitation with R quad eccentric control      PT LONG TERM GOAL #5   Title  Patient to return to gym activities without pain limiting.     Time  6    Period  Weeks    Status  On-going            Plan - 04/23/18 1238    Clinical Impression Statement  Morgan Oneal reporting no issues with updated/consolidated HEP re-issued to her last session (see pt. education).  Tolerated increased focus on proximal hip strengthening activities well today with addition of 4-way hip kicker with red band resistance.  Continues to report improving R knee pain since starting therapy.  Notes she is able to tolerate half a day of walking without R knee pain and increased tolerance for prolonged sitting.  Progressing well toward goals.      Personal Factors and Comorbidities  Past/Current Experience;Comorbidity 3+;Time since onset of injury/illness/exacerbation    Comorbidities  depression, asthma, R knee arthroscopy with lateral release     Examination-Activity Limitations  Bend;Sit;Carry;Squat;Stairs;Transfers;Lift;Locomotion Level;Stand    Examination-Participation Restrictions  Community Activity;Shop;Driving;Interpersonal Relationship;Yard Work;Laundry;Meal Prep    Stability/Clinical Decision Making  Stable/Uncomplicated    Rehab Potential  Good    PT Treatment/Interventions  ADLs/Self Care Home Management;Cryotherapy;Electrical Stimulation;Iontophoresis 59m/ml Dexamethasone;Functional mobility training;Stair training;Gait training;Ultrasound;Moist Heat;Therapeutic activities;Therapeutic exercise;Balance training;Neuromuscular re-education;Patient/family education;Passive range of motion;Manual techniques;Dry needling;Energy conservation;Splinting;Taping;Vasopneumatic Device    PT Next Visit Plan  progress LE stretching and strengthening to tolerance    Consulted and Agree with Plan of Care  Patient       Patient will benefit from skilled therapeutic intervention in  order to improve the following deficits and impairments:  Abnormal gait, Decreased activity tolerance, Decreased strength, Increased fascial restricitons, Pain, Difficulty walking, Decreased balance, Decreased range of motion, Improper body mechanics, Postural dysfunction, Impaired flexibility  Visit Diagnosis: Chronic pain of right knee  Other symptoms and signs involving the musculoskeletal  system     Problem List Patient Active Problem List   Diagnosis Date Noted  . Urge incontinence 08/01/2017  . Raynaud's disease, idiopathic 07/24/2017    Bess Harvest, PTA 04/23/18 2:30 PM   [facility] [address]  [place], [place], [address] Phone: [phone]   Fax:  [phone]  Name: Morgan Oneal MRN: [id number] Date of Birth: 1985-02-11

## 2018-04-25 ENCOUNTER — Encounter: Payer: Self-pay | Admitting: Physical Therapy

## 2018-04-25 ENCOUNTER — Ambulatory Visit: Payer: 59 | Admitting: Physical Therapy

## 2018-04-25 ENCOUNTER — Other Ambulatory Visit: Payer: Self-pay

## 2018-04-25 DIAGNOSIS — G8929 Other chronic pain: Secondary | ICD-10-CM | POA: Diagnosis not present

## 2018-04-25 DIAGNOSIS — M25561 Pain in right knee: Principal | ICD-10-CM

## 2018-04-25 DIAGNOSIS — R29898 Other symptoms and signs involving the musculoskeletal system: Secondary | ICD-10-CM | POA: Diagnosis not present

## 2018-04-25 NOTE — Therapy (Signed)
[facility] [place] [address]  [place], [place], [address] Phone: [phone]   Fax:  [phone]  Physical Therapy Progress Note  Patient Details  Name: Morgan Oneal MRN: [id number] Date of Birth: July 31, 1985 Referring Provider (PT): Jean Rosenthal, MD   Encounter Date: 04/25/2018  PT End of Session - 04/25/18 1025    Visit Number  10    Number of Visits  20    Date for PT Re-Evaluation  05/30/18    Authorization Type  [facility]    PT Start Time  0933    PT Stop Time  1029    PT Time Calculation (min)  56 min    Activity Tolerance  Patient tolerated treatment well    Behavior During Therapy  [facility] for tasks assessed/performed       Past Medical History:  Diagnosis Date  . Asthma   . Depression     Past Surgical History:  Procedure Laterality Date  . PARTIAL HYSTERECTOMY  2015    There were no vitals filed for this visit.  Subjective Assessment - 04/25/18 0934    Subjective  Reports that she has gotten some schoolwork done this morning. Reports 80% improvement since initial eval- improvement in pain levels and strength.     Pertinent History  depression, asthma, R knee arthroscopy with lateral release 2015    Diagnostic tests  02/22/18 R knee MRI: Mild patellar cartilage degeneration as described above. No definite meniscal tear. However, there is a 6 x 18 x 7 mm loculated cystic lesion behind the medial meniscus posterior horn and root.     Patient Stated Goals  strengthening the knee to return to the gym    Currently in Pain?  No/denies         [facility] PT Assessment - 04/25/18 0001      Assessment   Medical Diagnosis  Chronic pain of R knee    Referring Provider (PT)  Jean Rosenthal, MD    Onset Date/Surgical Date  --   several years duration     Observation/Other Assessments   Focus on Therapeutic Outcomes (FOTO)   Knee: 66 (34% limited, 32% predicted)      AROM   AROM Assessment Site   Knee    Right/Left Knee  Right    Right Knee Extension  0    Right Knee Flexion  134      PROM   PROM Assessment Site  Knee    Right/Left Knee  Right    Right Knee Extension  -1    Right Knee Flexion  134   discomfort in knee     Flexibility   ITB  B LEs [facility]                   [facility] Adult PT Treatment/Exercise - 04/25/18 0001      Ambulation/Gait   Stairs  Yes    Stairs Assistance  7: Independent    Stair Management Technique  No rails    Number of Stairs  13    Height of Stairs  8    Gait Comments  audible R knee crepitus and 6/10 pain when descending down stairs; slight improvement with R quad stability and control      Knee/Hip Exercises: Stretches   Other Knee/Hip Stretches  R & L pigeon pose off edge of mat 2x30"      Knee/Hip Exercises: Aerobic   Recumbent Bike  Lvl 3,  6 min       Knee/Hip Exercises: Standing   Other Standing Knee Exercises  side lunges at TRX x10 each side    cues for knee/toe alignment     Knee/Hip Exercises: Supine   Single Leg Bridge  10 reps;Strengthening;Both   bridge on orange pball + SLR     Knee/Hip Exercises: Sidelying   Clams  clamshell + side plank with green TB at knees 10 reps each side      Vasopneumatic   Number Minutes Vasopneumatic   15 minutes    Vasopnuematic Location   Knee   R   Vasopneumatic Pressure  Medium    Vasopneumatic Temperature   coldest             PT Education - 04/25/18 1024    Education Details  update to HEP; discussion of objective progress thus far    Person(s) Educated  Patient    Methods  Explanation;Demonstration;Tactile cues;Verbal cues;Handout    Comprehension  Verbalized understanding;Returned demonstration       PT Short Term Goals - 04/25/18 1025      PT SHORT TERM GOAL #1   Title  Patient to be independent with initial HEP.    Time  3    Period  Weeks    Status  Achieved    Target Date  04/01/18      PT SHORT TERM GOAL #2   Title  Patient to return to running  for 5 minutes at a time without pain limiting.     Time  3    Period  Weeks    Status  New    Target Date  05/16/18        PT Long Term Goals - 04/25/18 0945      PT LONG TERM GOAL #1   Title  Patient to be independent with advanced HEP.    Time  5    Period  Weeks    Status  On-going   04/25/18: met for current   Target Date  05/30/18      PT LONG TERM GOAL #2   Title  Patient to demonstrate R knee AROM/PROM pain-free and symmetrical to L LE.    Time  5    Period  Weeks    Status  Partially Met   04/25/18: improvement in R knee flexion AROM/PROM but with mild discomfort with PROM   Target Date  05/30/18      PT LONG TERM GOAL #3   Title  Patient to demonstrate mild tightness in R TFL.    Time  6    Period  Weeks    Status  Achieved      PT LONG TERM GOAL #4   Title  Patient to demonstrate good R quad stability and no pain with stair climbing up/down 13 steps with 1 handrail as needed.     Time  5    Period  Weeks    Status  Partially Met   04/25/18: audible R knee crepitus and 6/10 pain when descending down stairs; slight improvement with R quad stability and control   Target Date  05/30/18      PT LONG TERM GOAL #5   Title  Patient to return to gym activities without pain limiting.     Time  5    Period  Weeks    Status  Partially Met   gym closed but walking 1 mile a couple times a week  Target Date  05/30/18      Additional Long Term Goals   Additional Long Term Goals  Yes      PT LONG TERM GOAL #6   Title  Patient to return to running for 10 minutes at a time without pain limiting.     Time  5    Period  Weeks    Status  New    Target Date  05/30/18            Plan - 04/25/18 1046    Clinical Impression Statement  Patient arrived to session with report of 80% improvement in R knee since initial eval. Noted improvements in pain levels, strength, and ability to maneuver up/down stairs. Patient has been consistent with HEP. Has met flexibility goal  at this time, with Ober's test negative on B sides today. Has demonstrated improvement in R knee flexion AROM/PROM but with mild discomfort with PROM. Patient with audible R knee crepitus and 6/10 pain when descending down stairs; slight improvement with R quad stability and control since initial. D/t the stay at home orders, patient has been unable to return to her usual gym activities, but has stayed consistent with walking. Would like to return to running without pain- added running goals to reflect this. Worked on progressive hip strengthening today with good effort to maintain consistent form. Introduced side lunges with audible crepitus in R knee and cues provided to avoid deep lunge to prevent pain. Ended session with gameready to R knee for pain relief. No complaints at end of session. Patient is demonstrating good progress with PT thus far, would benefit from additional 2x/week for 5 weeks to return to PLOF and physical activities.     Comorbidities  depression, asthma, R knee arthroscopy with lateral release     PT Treatment/Interventions  ADLs/Self Care Home Management;Cryotherapy;Electrical Stimulation;Iontophoresis 17m/ml Dexamethasone;Functional mobility training;Stair training;Gait training;Ultrasound;Moist Heat;Therapeutic activities;Therapeutic exercise;Balance training;Neuromuscular re-education;Patient/family education;Passive range of motion;Manual techniques;Dry needling;Energy conservation;Splinting;Taping;Vasopneumatic Device    PT Next Visit Plan  progress LE stretching and strengthening to tolerance; introduce plyometrics to ease into running     Consulted and Agree with Plan of Care  Patient       Patient will benefit from skilled therapeutic intervention in order to improve the following deficits and impairments:  Abnormal gait, Decreased activity tolerance, Decreased strength, Increased fascial restricitons, Pain, Difficulty walking, Decreased balance, Decreased range of motion,  Improper body mechanics, Postural dysfunction, Impaired flexibility  Visit Diagnosis: Chronic pain of right knee  Other symptoms and signs involving the musculoskeletal system     Problem List Patient Active Problem List   Diagnosis Date Noted  . Urge incontinence 08/01/2017  . Raynaud's disease, idiopathic 07/24/2017    YJanene Harvey PT, DPT 04/25/18 10:56 AM   [facility] [place] [address]Phone: [phone]  Fax:  [phone] Name: Morgan FELLOWSMRN: [id number] of Birth: 714-Jan-1987

## 2018-04-29 ENCOUNTER — Other Ambulatory Visit: Payer: Self-pay | Admitting: *Deleted

## 2018-04-29 DIAGNOSIS — R202 Paresthesia of skin: Secondary | ICD-10-CM

## 2018-04-30 ENCOUNTER — Ambulatory Visit: Payer: 59 | Admitting: Physical Therapy

## 2018-04-30 ENCOUNTER — Encounter: Payer: Self-pay | Admitting: Physical Therapy

## 2018-04-30 ENCOUNTER — Other Ambulatory Visit: Payer: Self-pay

## 2018-04-30 DIAGNOSIS — G8929 Other chronic pain: Secondary | ICD-10-CM

## 2018-04-30 DIAGNOSIS — M25561 Pain in right knee: Secondary | ICD-10-CM | POA: Diagnosis not present

## 2018-04-30 DIAGNOSIS — R29898 Other symptoms and signs involving the musculoskeletal system: Secondary | ICD-10-CM | POA: Diagnosis not present

## 2018-04-30 NOTE — Therapy (Signed)
[facility] [address]  [address] [place], [place], [address] Phone: [phone]   Fax:  [phone]  Physical Therapy Treatment  Patient Details  Name: Morgan Oneal MRN: [id number] Date of Birth: 07/03/1985 Referring Provider (PT): Christopher Blackman, MD   Encounter Date: 04/30/2018  PT End of Session - 04/30/18 1017    Visit Number  11    Number of Visits  20    Date for PT Re-Evaluation  05/30/18    Authorization Type  [facility]    PT Start Time  0934    PT Stop Time  1013    PT Time Calculation (min)  39 min    Activity Tolerance  Patient tolerated treatment well    Behavior During Therapy  W[facility] for tasks assessed/performed       Past Medical History:  Diagnosis Date  . Asthma   . Depression     Past Surgical History:  Procedure Laterality Date  . PARTIAL HYSTERECTOMY  2015    There were no vitals filed for this visit.  Subjective Assessment - 04/30/18 0935    Subjective  Reports that her knee has been feeling good.    Pertinent History  depression, asthma, R knee arthroscopy with lateral release 2015    Diagnostic tests  02/22/18 R knee MRI: Mild patellar cartilage degeneration as described above. No definite meniscal tear. However, there is a 6 x 18 x 7 mm loculated cystic lesion behind the medial meniscus posterior horn and root.     Patient Stated Goals  strengthening the knee to return to the gym    Currently in Pain?  No/denies                       [facility] Adult PT Treatment/Exercise - 04/30/18 0001      Knee/Hip Exercises: Stretches   Passive Hamstring Stretch  Right;Left;1 rep;30 seconds    Passive Hamstring Stretch Limitations  supine with strap    Quad Stretch  Right;2 reps;30 seconds    Quad Stretch Limitations  prone with strap     Other Knee/Hip Stretches  R & L pigeon pose off edge of mat 2x30"      Knee/Hip Exercises: Aerobic   Recumbent Bike  Lvl 3, 6 min       Knee/Hip Exercises: Plyometrics   Bilateral Jumping  2 sets    Bilateral Jumping Limitations  ant/pos hops over threshhold 2x20"    Other Plyometric Exercises   B M/L hops over threshold 20"      Knee/Hip Exercises: Standing   Other Standing Knee Exercises  R & L split squats at treadmill rail x10 each    cues for shallow depth     Knee/Hip Exercises: Supine   Bridges  Strengthening;Both;1 set;10 reps    Bridges Limitations  straight leg bridge on orange pball + SLR   cues for core contraction   Single Leg Bridge  10 reps;Strengthening;Both   + ball squeeze     Kinesiotix   Create Space  R knee chondromalacia taping pattern                PT Short Term Goals - 04/30/18 1032      PT SHORT TERM GOAL #1   Title  Patient to be independent with initial HEP.    Time  3    Period  Weeks    Status  Achieved    Target Date    04/01/18      PT SHORT TERM GOAL #2   Title  Patient to return to running for 5 minutes at a time without pain limiting.     Time  3    Period  Weeks    Status  On-going    Target Date  05/16/18        PT Long Term Goals - 04/30/18 1032      PT LONG TERM GOAL #1   Title  Patient to be independent with advanced HEP.    Time  5    Period  Weeks    Status  On-going   04/25/18: met for current     PT LONG TERM GOAL #2   Title  Patient to demonstrate R knee AROM/PROM pain-free and symmetrical to L LE.    Time  5    Period  Weeks    Status  Partially Met   04/25/18: improvement in R knee flexion AROM/PROM but with mild discomfort with PROM     PT LONG TERM GOAL #3   Title  Patient to demonstrate mild tightness in R TFL.    Time  6    Period  Weeks    Status  Achieved      PT LONG TERM GOAL #4   Title  Patient to demonstrate good R quad stability and no pain with stair climbing up/down 13 steps with 1 handrail as needed.     Time  5    Period  Weeks    Status  Partially Met   04/25/18: audible R knee crepitus and 6/10 pain when descending  down stairs; slight improvement with R quad stability and control     PT LONG TERM GOAL #5   Title  Patient to return to gym activities without pain limiting.     Time  5    Period  Weeks    Status  Partially Met   gym closed but walking 1 mile a couple times a week     PT LONG TERM GOAL #6   Title  Patient to return to running for 10 minutes at a time without pain limiting.     Time  5    Period  Weeks    Status  On-going            Plan - 04/30/18 1018    Clinical Impression Statement  Patient arrived to session with no new concerns. Introduced light plyometrics today with patient reporting "small jolt of pain" in R anterior knee with anterior/posterior and M/L hopping activities. Cues required to correct tendency for valgus collapse on R LE with good improvement in form. Also worked on shallow split squats with audible crepitus in R knee but good overall form. Ended session with Kinesiotape to R knee for improved functional activity tolerance as patient reporting continued benefit from this modality. No complaints at end of session. Plan to continue addressing glute strengthening and slowly increasing plyometrics.     Comorbidities  depression, asthma, R knee arthroscopy with lateral release     PT Treatment/Interventions  ADLs/Self Care Home Management;Cryotherapy;Electrical Stimulation;Iontophoresis 4mg/ml Dexamethasone;Functional mobility training;Stair training;Gait training;Ultrasound;Moist Heat;Therapeutic activities;Therapeutic exercise;Balance training;Neuromuscular re-education;Patient/family education;Passive range of motion;Manual techniques;Dry needling;Energy conservation;Splinting;Taping;Vasopneumatic Device    PT Next Visit Plan  addressing glute strengthening and slowly increasing plyometrics    Consulted and Agree with Plan of Care  Patient       Patient will benefit from skilled therapeutic intervention in order to improve the   following deficits and impairments:   Abnormal gait, Decreased activity tolerance, Decreased strength, Increased fascial restricitons, Pain, Difficulty walking, Decreased balance, Decreased range of motion, Improper body mechanics, Postural dysfunction, Impaired flexibility  Visit Diagnosis: Chronic pain of right knee  Other symptoms and signs involving the musculoskeletal system     Problem List Patient Active Problem List   Diagnosis Date Noted  . Urge incontinence 08/01/2017  . Raynaud's disease, idiopathic 07/24/2017     Janene Harvey, PT, DPT 04/30/18 10:34 AM    [facility] [address]  [place], [place], [address] Phone: [phone]   Fax:  [phone]  Name: Morgan Oneal MRN: [id number] Date of Birth: [birth year]

## 2018-05-02 ENCOUNTER — Ambulatory Visit: Payer: 59

## 2018-05-07 ENCOUNTER — Other Ambulatory Visit: Payer: Self-pay

## 2018-05-07 ENCOUNTER — Ambulatory Visit: Payer: 59 | Attending: Orthopaedic Surgery

## 2018-05-07 DIAGNOSIS — M25561 Pain in right knee: Secondary | ICD-10-CM | POA: Insufficient documentation

## 2018-05-07 DIAGNOSIS — G8929 Other chronic pain: Secondary | ICD-10-CM | POA: Insufficient documentation

## 2018-05-07 DIAGNOSIS — R29898 Other symptoms and signs involving the musculoskeletal system: Secondary | ICD-10-CM | POA: Insufficient documentation

## 2018-05-07 NOTE — Therapy (Signed)
[facility] [address]  [place], [place], [address] Phone: [phone]   Fax:  [phone]  Physical Therapy Treatment  Patient Details  Name: Morgan Oneal MRN: [id number] Date of Birth: 11-Jul-1985 Referring Provider (PT): Jean Rosenthal, MD   Encounter Date: 05/07/2018  PT End of Session - 05/07/18 0953    Visit Number  12    Number of Visits  20    Date for PT Re-Evaluation  05/30/18    Authorization Type  [facility]    PT Start Time  0944    PT Stop Time  1025    PT Time Calculation (min)  41 min    Activity Tolerance  Patient tolerated treatment well    Behavior During Therapy  [facility] for tasks assessed/performed       Past Medical History:  Diagnosis Date  . Asthma   . Depression     Past Surgical History:  Procedure Laterality Date  . PARTIAL HYSTERECTOMY  2015    There were no vitals filed for this visit.  Subjective Assessment - 05/07/18 0946    Subjective  Pt. reporting she is getting poor sleep due to work schedule.      Pertinent History  depression, asthma, R knee arthroscopy with lateral release 2015    Diagnostic tests  02/22/18 R knee MRI: Mild patellar cartilage degeneration as described above. No definite meniscal tear. However, there is a 6 x 18 x 7 mm loculated cystic lesion behind the medial meniscus posterior horn and root.     Patient Stated Goals  strengthening the knee to return to the gym    Currently in Pain?  No/denies    Pain Score  0-No pain   R knee pain rising to 7/10 at worst   Pain Location  Knee    Pain Orientation  Right    Pain Descriptors / Indicators  Burning    Pain Type  Chronic pain    Multiple Pain Sites  No                       [facility] Adult PT Treatment/Exercise - 05/07/18 0001      Knee/Hip Exercises: Stretches   Passive Hamstring Stretch  Right;Left;1 rep;30 seconds    Passive Hamstring Stretch Limitations  supine with strap    Quad  Stretch  Right;2 reps;30 seconds    Quad Stretch Limitations  prone with strap     ITB Stretch  Right;1 rep;30 seconds    ITB Stretch Limitations  strap     Piriformis Stretch  Right;2 reps;30 seconds    Piriformis Stretch Limitations  KTOS, Figure-4    Gastroc Stretch  Right;1 rep;30 seconds    Gastroc Stretch Limitations  into counter       Knee/Hip Exercises: Aerobic   Recumbent Bike  Lvl 3, 6 min       Knee/Hip Exercises: Plyometrics   Other Plyometric Exercises  Jump rope 2 x 30 sec     Other Plyometric Exercises  Ladder drill warmup: 2-in-2-out, icky shuffle, scissor drill, 2 LE hop, high knee forward, high knee lateral x 1 lap each       Knee/Hip Exercises: Standing   Functional Squat  1 set;10 reps;3 seconds    Functional Squat Limitations  squat to low box ~ 13" green band at knees x 10 resp     Other Standing Knee Exercises  Side stepping, monster walk forward/backwards  monster walk with green TB at ankles x 45 ft each way       Knee/Hip Exercises: Sidelying   Clams  clamshell + side plank with green TB at knees 2 x 12 reps each side      Vasopneumatic   Number Minutes Vasopneumatic   10 minutes    Vasopnuematic Location   Knee   R   Vasopneumatic Pressure  Medium    Vasopneumatic Temperature   coldest               PT Short Term Goals - 04/30/18 1032      PT SHORT TERM GOAL #1   Title  Patient to be independent with initial HEP.    Time  3    Period  Weeks    Status  Achieved    Target Date  04/01/18      PT SHORT TERM GOAL #2   Title  Patient to return to running for 5 minutes at a time without pain limiting.     Time  3    Period  Weeks    Status  On-going    Target Date  05/16/18        PT Long Term Goals - 04/30/18 1032      PT LONG TERM GOAL #1   Title  Patient to be independent with advanced HEP.    Time  5    Period  Weeks    Status  On-going   04/25/18: met for current     PT LONG TERM GOAL #2   Title  Patient to demonstrate R  knee AROM/PROM pain-free and symmetrical to L LE.    Time  5    Period  Weeks    Status  Partially Met   04/25/18: improvement in R knee flexion AROM/PROM but with mild discomfort with PROM     PT LONG TERM GOAL #3   Title  Patient to demonstrate mild tightness in R TFL.    Time  6    Period  Weeks    Status  Achieved      PT LONG TERM GOAL #4   Title  Patient to demonstrate good R quad stability and no pain with stair climbing up/down 13 steps with 1 handrail as needed.     Time  5    Period  Weeks    Status  Partially Met   04/25/18: audible R knee crepitus and 6/10 pain when descending down stairs; slight improvement with R quad stability and control     PT LONG TERM GOAL #5   Title  Patient to return to gym activities without pain limiting.     Time  5    Period  Weeks    Status  Partially Met   gym closed but walking 1 mile a couple times a week     PT LONG TERM GOAL #6   Title  Patient to return to running for 10 minutes at a time without pain limiting.     Time  5    Period  Weeks    Status  On-going            Plan - 05/07/18 6962    Clinical Impression Statement  Pt. doing well today.  Notes she has been getting poor sleep due to difficult work schedule.  Initiated ladder drill agility plyometrics and jump rope today which was tolerated well without pain.  Did have onset of "tightness" in lateral  knee reported at sight of R distal ITB insertion after 2 min of elliptical near end of session which was relieved with LE stretches.  Ended session with R knee discomfort thus applied ice/compression to R knee to reduce post-exerice soreness.      Personal Factors and Comorbidities  Past/Current Experience;Comorbidity 3+;Time since onset of injury/illness/exacerbation    Comorbidities  depression, asthma, R knee arthroscopy with lateral release     Examination-Activity Limitations  Bend;Sit;Carry;Squat;Stairs;Transfers;Lift;Locomotion Level;Stand     Examination-Participation Restrictions  Community Activity;Shop;Driving;Interpersonal Relationship;Yard Work;Laundry;Meal Prep    Rehab Potential  Good    PT Treatment/Interventions  ADLs/Self Care Home Management;Cryotherapy;Electrical Stimulation;Iontophoresis 67m/ml Dexamethasone;Functional mobility training;Stair training;Gait training;Ultrasound;Moist Heat;Therapeutic activities;Therapeutic exercise;Balance training;Neuromuscular re-education;Patient/family education;Passive range of motion;Manual techniques;Dry needling;Energy conservation;Splinting;Taping;Vasopneumatic Device    PT Next Visit Plan  addressing glute strengthening and slowly increasing plyometrics    Consulted and Agree with Plan of Care  Patient       Patient will benefit from skilled therapeutic intervention in order to improve the following deficits and impairments:  Abnormal gait, Decreased activity tolerance, Decreased strength, Increased fascial restricitons, Pain, Difficulty walking, Decreased balance, Decreased range of motion, Improper body mechanics, Postural dysfunction, Impaired flexibility  Visit Diagnosis: Chronic pain of right knee  Other symptoms and signs involving the musculoskeletal system     Problem List Patient Active Problem List   Diagnosis Date Noted  . Urge incontinence 08/01/2017  . Raynaud's disease, idiopathic 07/24/2017    MBess Harvest PTA 05/07/18 10:53 AM   [facility] [place] [address]Phone: [phone]  Fax:  [phone] Name: Morgan BROCATOMRN: [id number] of Birth: 7December 17, 1987

## 2018-05-09 ENCOUNTER — Other Ambulatory Visit: Payer: Self-pay

## 2018-05-09 ENCOUNTER — Ambulatory Visit: Payer: 59

## 2018-05-09 DIAGNOSIS — G8929 Other chronic pain: Secondary | ICD-10-CM

## 2018-05-09 DIAGNOSIS — R29898 Other symptoms and signs involving the musculoskeletal system: Secondary | ICD-10-CM

## 2018-05-09 DIAGNOSIS — M25561 Pain in right knee: Secondary | ICD-10-CM | POA: Diagnosis not present

## 2018-05-09 NOTE — Therapy (Signed)
[facility] [address]  [place], [place], [address] Phone: [phone]   Fax:  [phone]  Physical Therapy Treatment  Patient Details  Name: Morgan Oneal MRN: [id number] Date of Birth: [birth year] Referring Provider (PT): Jean Rosenthal, MD   Encounter Date: 05/09/2018  PT End of Session - 05/09/18 0945    Visit Number  13    Number of Visits  20    Date for PT Re-Evaluation  05/30/18    Authorization Type  [facility]    PT Start Time  0941    PT Stop Time  1034    PT Time Calculation (min)  53 min    Activity Tolerance  Patient tolerated treatment well    Behavior During Therapy  [facility] for tasks assessed/performed       Past Medical History:  Diagnosis Date  . Asthma   . Depression     Past Surgical History:  Procedure Laterality Date  . PARTIAL HYSTERECTOMY  2015    There were no vitals filed for this visit.  Subjective Assessment - 05/09/18 0946    Subjective  Pt. noting still having R knee pain while walking up to 6/10 however denies pain today.  Notes she felt fine after plyometric last session.      Pertinent History  depression, asthma, R knee arthroscopy with lateral release 2015    Diagnostic tests  02/22/18 R knee MRI: Mild patellar cartilage degeneration as described above. No definite meniscal tear. However, there is a 6 x 18 x 7 mm loculated cystic lesion behind the medial meniscus posterior horn and root.     Patient Stated Goals  strengthening the knee to return to the gym    Currently in Pain?  No/denies    Pain Score  0-No pain   up to 6/10 at times with walking    Pain Location  Knee    Pain Orientation  Right    Pain Descriptors / Indicators  Burning    Pain Type  Chronic pain    Multiple Pain Sites  No                       [facility] Adult PT Treatment/Exercise - 05/09/18 0001      Knee/Hip Exercises: Stretches   Hip Flexor Stretch  Right;1 rep;60 seconds    Hip Flexor Stretch Limitations  R mod thomas postition with strap     ITB Stretch  Right;1 rep;30 seconds    ITB Stretch Limitations  strap       Knee/Hip Exercises: Aerobic   Recumbent Bike  Lvl 3, 6 min       Knee/Hip Exercises: Machines for Strengthening   Cybex Leg Press  --      Knee/Hip Exercises: Plyometrics   Bilateral Jumping  1 set;10 reps;Box Height: 8"    Bilateral Jumping Limitations  8" step with cueing for soft landing and neutral knee alignment     Other Plyometric Exercises  Jump rope 2 x 30 sec    Cues provided for soft landing      Knee/Hip Exercises: Standing   Forward Lunges  Right;Left;3 seconds   x 12 reps    Forward Lunges Limitations  TRX      Knee/Hip Exercises: Supine   Single Leg Bridge  Right;Left;15 reps   + adduction ball squeeze + SLR   Other Supine Knee/Hip Exercises  Bridge + HS curl x 10 reps  with heels on peanut pball       Knee/Hip Exercises: Prone   Other Prone Exercises  Plank on elbows/toes 2 x 45"      Vasopneumatic   Number Minutes Vasopneumatic   15 minutes    Vasopnuematic Location   Knee   R   Vasopneumatic Pressure  Medium    Vasopneumatic Temperature   coldest               PT Short Term Goals - 04/30/18 1032      PT SHORT TERM GOAL #1   Title  Patient to be independent with initial HEP.    Time  3    Period  Weeks    Status  Achieved    Target Date  04/01/18      PT SHORT TERM GOAL #2   Title  Patient to return to running for 5 minutes at a time without pain limiting.     Time  3    Period  Weeks    Status  On-going    Target Date  05/16/18        PT Long Term Goals - 04/30/18 1032      PT LONG TERM GOAL #1   Title  Patient to be independent with advanced HEP.    Time  5    Period  Weeks    Status  On-going   04/25/18: met for current     PT LONG TERM GOAL #2   Title  Patient to demonstrate R knee AROM/PROM pain-free and symmetrical to L LE.    Time  5    Period  Weeks    Status  Partially  Met   04/25/18: improvement in R knee flexion AROM/PROM but with mild discomfort with PROM     PT LONG TERM GOAL #3   Title  Patient to demonstrate mild tightness in R TFL.    Time  6    Period  Weeks    Status  Achieved      PT LONG TERM GOAL #4   Title  Patient to demonstrate good R quad stability and no pain with stair climbing up/down 13 steps with 1 handrail as needed.     Time  5    Period  Weeks    Status  Partially Met   04/25/18: audible R knee crepitus and 6/10 pain when descending down stairs; slight improvement with R quad stability and control     PT LONG TERM GOAL #5   Title  Patient to return to gym activities without pain limiting.     Time  5    Period  Weeks    Status  Partially Met   gym closed but walking 1 mile a couple times a week     PT LONG TERM GOAL #6   Title  Patient to return to running for 10 minutes at a time without pain limiting.     Time  5    Period  Weeks    Status  On-going            Plan - 05/09/18 1540    Clinical Impression Statement  Pt. reporting no significant soreness after last session plyometrics/agility ladder work.  Has been busy with final exams and moving thus has not been able to perform HEP.  Pt. instructed to make time for HEP for full benefit from therapy.  anticipate pt. with improved compliance with HEP after final exams finish over next  few days.  Tolerated mild progression in intensity of plyometrics and continued focus on glute strengthening well today.  Ended visit with feeling of discomfort in R knee and pt. requesting ice/compression machine for hopeful improvement in post-exercise soreness/swelling.  Will continue to progress toward existing goals.      Personal Factors and Comorbidities  Past/Current Experience;Comorbidity 3+;Time since onset of injury/illness/exacerbation    Comorbidities  depression, asthma, R knee arthroscopy with lateral release     Examination-Activity Limitations   Bend;Sit;Carry;Squat;Stairs;Transfers;Lift;Locomotion Level;Stand    Examination-Participation Restrictions  Community Activity;Shop;Driving;Interpersonal Relationship;Yard Work;Laundry;Meal Prep    Rehab Potential  Good    PT Treatment/Interventions  ADLs/Self Care Home Management;Cryotherapy;Electrical Stimulation;Iontophoresis 37m/ml Dexamethasone;Functional mobility training;Stair training;Gait training;Ultrasound;Moist Heat;Therapeutic activities;Therapeutic exercise;Balance training;Neuromuscular re-education;Patient/family education;Passive range of motion;Manual techniques;Dry needling;Energy conservation;Splinting;Taping;Vasopneumatic Device    PT Next Visit Plan  addressing glute strengthening and slowly increasing plyometrics    Consulted and Agree with Plan of Care  Patient       Patient will benefit from skilled therapeutic intervention in order to improve the following deficits and impairments:  Abnormal gait, Decreased activity tolerance, Decreased strength, Increased fascial restricitons, Pain, Difficulty walking, Decreased balance, Decreased range of motion, Improper body mechanics, Postural dysfunction, Impaired flexibility  Visit Diagnosis: Chronic pain of right knee  Other symptoms and signs involving the musculoskeletal system     Problem List Patient Active Problem List   Diagnosis Date Noted  . Urge incontinence 08/01/2017  . Raynaud's disease, idiopathic 07/24/2017    MBess Harvest PTA 05/09/18 10:35 AM   [facility] [place] [address]Phone: [phone]  Fax:  [phone] Name: Morgan ENGLANDERMRN: [id number] of Birth: [birth year]

## 2018-05-14 ENCOUNTER — Ambulatory Visit: Payer: 59

## 2018-05-14 ENCOUNTER — Other Ambulatory Visit: Payer: Self-pay

## 2018-05-14 DIAGNOSIS — R29898 Other symptoms and signs involving the musculoskeletal system: Secondary | ICD-10-CM

## 2018-05-14 DIAGNOSIS — G8929 Other chronic pain: Secondary | ICD-10-CM

## 2018-05-14 DIAGNOSIS — M25561 Pain in right knee: Secondary | ICD-10-CM | POA: Diagnosis not present

## 2018-05-14 NOTE — Therapy (Signed)
[facility] [address]  [place], [place], [address] Phone: [phone]   Fax:  [phone]  Physical Therapy Treatment  Patient Details  Name: Morgan Oneal MRN: [id number] Date of Birth: Sep 10, 1985 Referring Provider (PT): Jean Rosenthal, MD   Encounter Date: 05/14/2018  PT End of Session - 05/14/18 1038    Visit Number  14    Number of Visits  20    Date for PT Re-Evaluation  05/30/18    Authorization Type  [facility]    PT Start Time  1030    PT Stop Time  1112    PT Time Calculation (min)  42 min    Activity Tolerance  Patient tolerated treatment well    Behavior During Therapy  [facility] for tasks assessed/performed       Past Medical History:  Diagnosis Date  . Asthma   . Depression     Past Surgical History:  Procedure Laterality Date  . PARTIAL HYSTERECTOMY  2015    There were no vitals filed for this visit.  Subjective Assessment - 05/14/18 1043    Subjective  Pt. noting hamstring soreness after last session x 3 days.      Pertinent History  depression, asthma, R knee arthroscopy with lateral release 2015    Diagnostic tests  02/22/18 R knee MRI: Mild patellar cartilage degeneration as described above. No definite meniscal tear. However, there is a 6 x 18 x 7 mm loculated cystic lesion behind the medial meniscus posterior horn and root.     Patient Stated Goals  strengthening the knee to return to the gym    Currently in Pain?  No/denies    Pain Score  0-No pain    Multiple Pain Sites  No                       [facility] Adult PT Treatment/Exercise - 05/14/18 0001      Knee/Hip Exercises: Stretches   Hip Flexor Stretch  Right;1 rep;60 seconds    Hip Flexor Stretch Limitations  R mod thomas postition with strap     Piriformis Stretch  Right;2 reps;30 seconds    Piriformis Stretch Limitations  KTOS, Figure-4      Knee/Hip Exercises: Aerobic   Elliptical  Lvl 3.0, 6 min       Knee/Hip Exercises: Machines for Strengthening   Cybex Knee Flexion  B con/R eccentric: 25# x 15 reps     Cybex Leg Press  B LE's 40# x 15 reps; R only 25# x 20 reps       Knee/Hip Exercises: Plyometrics   Other Plyometric Exercises  Jump rope x 30 sec       Knee/Hip Exercises: Standing   Step Down  Right;Left;10 reps;Hand Hold: 1    Step Down Limitations  7" step in stair well allowing pt. to rest opposite foot with weight bearing between reps    Walking with Sports Cord  Forward, backwards, side/side resisted walking x 100 ft each way     Other Standing Knee Exercises  B hip hike on 7" step in stair well x 10 reps                PT Short Term Goals - 04/30/18 1032      PT SHORT TERM GOAL #1   Title  Patient to be independent with initial HEP.    Time  3    Period  Weeks    Status  Achieved    Target Date  04/01/18      PT SHORT TERM GOAL #2   Title  Patient to return to running for 5 minutes at a time without pain limiting.     Time  3    Period  Weeks    Status  On-going    Target Date  05/16/18        PT Long Term Goals - 04/30/18 1032      PT LONG TERM GOAL #1   Title  Patient to be independent with advanced HEP.    Time  5    Period  Weeks    Status  On-going   04/25/18: met for current     PT LONG TERM GOAL #2   Title  Patient to demonstrate R knee AROM/PROM pain-free and symmetrical to L LE.    Time  5    Period  Weeks    Status  Partially Met   04/25/18: improvement in R knee flexion AROM/PROM but with mild discomfort with PROM     PT LONG TERM GOAL #3   Title  Patient to demonstrate mild tightness in R TFL.    Time  6    Period  Weeks    Status  Achieved      PT LONG TERM GOAL #4   Title  Patient to demonstrate good R quad stability and no pain with stair climbing up/down 13 steps with 1 handrail as needed.     Time  5    Period  Weeks    Status  Partially Met   04/25/18: audible R knee crepitus and 6/10 pain when descending down stairs;  slight improvement with R quad stability and control     PT LONG TERM GOAL #5   Title  Patient to return to gym activities without pain limiting.     Time  5    Period  Weeks    Status  Partially Met   gym closed but walking 1 mile a couple times a week     PT LONG TERM GOAL #6   Title  Patient to return to running for 10 minutes at a time without pain limiting.     Time  5    Period  Weeks    Status  On-going            Plan - 05/14/18 1040    Clinical Impression Statement  Morgan Oneal reporting B hamstring soreness x three days after last session which has just subsided today.  Session focused on proximal hip/glute strengthening activities with addition of resisted sport-cord walk forward, backwards, side/side.  Tolerated all activities in session well today and ended session only noting LE fatigue.  Pt. encouraged to attempt light jog x 5 min before next session as to address pt. tolerance as she tolerates light plyometrics in session well without pain.  Will continue to progress toward goals.      Personal Factors and Comorbidities  Past/Current Experience;Comorbidity 3+;Time since onset of injury/illness/exacerbation    Comorbidities  depression, asthma, R knee arthroscopy with lateral release     Examination-Activity Limitations  Bend;Sit;Carry;Squat;Stairs;Transfers;Lift;Locomotion Level;Stand    Examination-Participation Restrictions  Community Activity;Shop;Driving;Interpersonal Relationship;Yard Work;Laundry;Meal Prep    Stability/Clinical Decision Making  Stable/Uncomplicated    PT Treatment/Interventions  ADLs/Self Care Home Management;Cryotherapy;Electrical Stimulation;Iontophoresis 37m/ml Dexamethasone;Functional mobility training;Stair training;Gait training;Ultrasound;Moist Heat;Therapeutic activities;Therapeutic exercise;Balance training;Neuromuscular re-education;Patient/family education;Passive range of motion;Manual techniques;Dry needling;Energy  conservation;Splinting;Taping;Vasopneumatic Device  PT Next Visit Plan  addressing glute strengthening and slowly increasing plyometrics    Consulted and Agree with Plan of Care  Patient       Patient will benefit from skilled therapeutic intervention in order to improve the following deficits and impairments:  Abnormal gait, Decreased activity tolerance, Decreased strength, Increased fascial restricitons, Pain, Difficulty walking, Decreased balance, Decreased range of motion, Improper body mechanics, Postural dysfunction, Impaired flexibility  Visit Diagnosis: Chronic pain of right knee  Other symptoms and signs involving the musculoskeletal system     Problem List Patient Active Problem List   Diagnosis Date Noted  . Urge incontinence 08/01/2017  . Raynaud's disease, idiopathic 07/24/2017     Bess Harvest, PTA 05/14/18 12:54 PM   [facility] [address]  [place], [place], [address] Phone: [phone]   Fax:  [phone]  Name: Morgan Oneal MRN: [id number] Date of Birth: September 30, 1985

## 2018-05-16 ENCOUNTER — Encounter: Payer: Self-pay | Admitting: Physical Therapy

## 2018-05-16 ENCOUNTER — Ambulatory Visit: Payer: 59 | Admitting: Physical Therapy

## 2018-05-16 ENCOUNTER — Other Ambulatory Visit: Payer: Self-pay

## 2018-05-16 DIAGNOSIS — M25561 Pain in right knee: Secondary | ICD-10-CM | POA: Diagnosis not present

## 2018-05-16 DIAGNOSIS — G8929 Other chronic pain: Secondary | ICD-10-CM | POA: Diagnosis not present

## 2018-05-16 DIAGNOSIS — R29898 Other symptoms and signs involving the musculoskeletal system: Secondary | ICD-10-CM | POA: Diagnosis not present

## 2018-05-16 NOTE — Therapy (Signed)
[facility] [address]  [place], [place], [address] Phone: [phone]   Fax:  [phone]  Physical Therapy Treatment  Patient Details  Name: Morgan Oneal MRN: [id number] Date of Birth: 05-14-1985 Referring Provider (PT): Jean Rosenthal, MD   Encounter Date: 05/16/2018  PT End of Session - 05/16/18 1208    Visit Number  15    Number of Visits  20    Date for PT Re-Evaluation  05/30/18    Authorization Type  [facility]    PT Start Time  0941   pt late   PT Stop Time  1027    PT Time Calculation (min)  46 min    Activity Tolerance  Patient tolerated treatment well;Patient limited by pain    Behavior During Therapy  [facility] for tasks assessed/performed       Past Medical History:  Diagnosis Date  . Asthma   . Depression     Past Surgical History:  Procedure Laterality Date  . PARTIAL HYSTERECTOMY  2015    There were no vitals filed for this visit.  Subjective Assessment - 05/16/18 0942    Subjective  Reports she is doing well. Didn't have as much soreness after last session. Reports that she has been moving and hasn't been stretching as much.     Diagnostic tests  02/22/18 R knee MRI: Mild patellar cartilage degeneration as described above. No definite meniscal tear. However, there is a 6 x 18 x 7 mm loculated cystic lesion behind the medial meniscus posterior horn and root.     Patient Stated Goals  strengthening the knee to return to the gym    Currently in Pain?  Yes    Pain Score  5     Pain Location  Knee    Pain Orientation  Right;Lateral    Pain Type  Chronic pain                       [facility] Adult PT Treatment/Exercise - 05/16/18 0001      Knee/Hip Exercises: Stretches   ITB Stretch  Right;30 seconds;2 reps    ITB Stretch Limitations  strap    Piriformis Stretch  Right;2 reps;30 seconds    Piriformis Stretch Limitations  KTOS sitting    Other Knee/Hip Stretches  R & L  pigeon pose off edge of mat 2x30"      Knee/Hip Exercises: Aerobic   Elliptical  Lvl 3.0, 6 min       Knee/Hip Exercises: Plyometrics   Other Plyometric Exercises  3 high knees + hold 3x20"   cues for high amplitude movements   Other Plyometric Exercises  squat jump into shallow squat x15   cues to land softly; audible R knee crepitus     Knee/Hip Exercises: Standing   SLS with Vectors  R LE to 3x5 cones    manual resistance on lateral R knee   Other Standing Knee Exercises  R single leg RDL + ball bounce x10   cues to maintain balance on R throughout     Vasopneumatic   Number Minutes Vasopneumatic   15 minutes    Vasopnuematic Location   Knee   R   Vasopneumatic Pressure  Medium    Vasopneumatic Temperature   coldest               PT Short Term Goals - 04/30/18 1032      PT SHORT TERM  GOAL #1   Title  Patient to be independent with initial HEP.    Time  3    Period  Weeks    Status  Achieved    Target Date  04/01/18      PT SHORT TERM GOAL #2   Title  Patient to return to running for 5 minutes at a time without pain limiting.     Time  3    Period  Weeks    Status  On-going    Target Date  05/16/18        PT Long Term Goals - 04/30/18 1032      PT LONG TERM GOAL #1   Title  Patient to be independent with advanced HEP.    Time  5    Period  Weeks    Status  On-going   04/25/18: met for current     PT LONG TERM GOAL #2   Title  Patient to demonstrate R knee AROM/PROM pain-free and symmetrical to L LE.    Time  5    Period  Weeks    Status  Partially Met   04/25/18: improvement in R knee flexion AROM/PROM but with mild discomfort with PROM     PT LONG TERM GOAL #3   Title  Patient to demonstrate mild tightness in R TFL.    Time  6    Period  Weeks    Status  Achieved      PT LONG TERM GOAL #4   Title  Patient to demonstrate good R quad stability and no pain with stair climbing up/down 13 steps with 1 handrail as needed.     Time  5     Period  Weeks    Status  Partially Met   04/25/18: audible R knee crepitus and 6/10 pain when descending down stairs; slight improvement with R quad stability and control     PT LONG TERM GOAL #5   Title  Patient to return to gym activities without pain limiting.     Time  5    Period  Weeks    Status  Partially Met   gym closed but walking 1 mile a couple times a week     PT LONG TERM GOAL #6   Title  Patient to return to running for 10 minutes at a time without pain limiting.     Time  5    Period  Weeks    Status  On-going            Plan - 05/16/18 1209    Clinical Impression Statement  Patient arrived late to session with no new complaints. Admitted to not performing HEP as consistently recently d/t moving.  5/10 R knee pain at beginning of session today. Began session with stretching to R TFL to help prevent exacerbation of pain with plyometrics. Introduced squat jumps- patient with good knee alignment and stability, with still with audible R knee crepitus and discomfort during landing. Advised patient to try softer landing, which she tolerated better. Good tolerance of high knees, but with visible imbalance and instability when landing on R LE vs. L. Patient requested ice at end of appointment, thus received [employer] to R knee. Ended session with no complaints.     Comorbidities  depression, asthma, R knee arthroscopy with lateral release     PT Treatment/Interventions  ADLs/Self Care Home Management;Cryotherapy;Electrical Stimulation;Iontophoresis 2m/ml Dexamethasone;Functional mobility training;Stair training;Gait training;Ultrasound;Moist Heat;Therapeutic activities;Therapeutic exercise;Balance training;Neuromuscular re-education;Patient/family education;Passive range  of motion;Manual techniques;Dry needling;Energy conservation;Splinting;Taping;Vasopneumatic Device    PT Next Visit Plan  addressing glute strengthening and slowly increasing plyometrics    Consulted and Agree  with Plan of Care  Patient       Patient will benefit from skilled therapeutic intervention in order to improve the following deficits and impairments:  Abnormal gait, Decreased activity tolerance, Decreased strength, Increased fascial restricitons, Pain, Difficulty walking, Decreased balance, Decreased range of motion, Improper body mechanics, Postural dysfunction, Impaired flexibility  Visit Diagnosis: Chronic pain of right knee  Other symptoms and signs involving the musculoskeletal system     Problem List Patient Active Problem List   Diagnosis Date Noted  . Urge incontinence 08/01/2017  . Raynaud's disease, idiopathic 07/24/2017    Janene Harvey, PT, DPT 05/16/18 12:13 PM    [facility] [address]  [place], [place], [address] Phone: [phone]   Fax:  [phone]  Name: Morgan Oneal MRN: [id number] Date of Birth: August 21, 1985

## 2018-05-21 ENCOUNTER — Other Ambulatory Visit: Payer: Self-pay

## 2018-05-21 ENCOUNTER — Ambulatory Visit: Payer: 59

## 2018-05-21 DIAGNOSIS — R29898 Other symptoms and signs involving the musculoskeletal system: Secondary | ICD-10-CM

## 2018-05-21 DIAGNOSIS — G8929 Other chronic pain: Secondary | ICD-10-CM | POA: Diagnosis not present

## 2018-05-21 DIAGNOSIS — M25561 Pain in right knee: Secondary | ICD-10-CM | POA: Diagnosis not present

## 2018-05-21 NOTE — Therapy (Signed)
[facility] [address]  [place], [place], [address] Phone: [phone]   Fax:  [phone]  Physical Therapy Treatment  Patient Details  Name: Morgan Oneal MRN: [id number] Date of Birth: 18-Jan-1985 Referring Provider (PT): Jean Rosenthal, MD   Encounter Date: 05/21/2018  PT End of Session - 05/21/18 1127    Visit Number  16    Number of Visits  20    Date for PT Re-Evaluation  05/30/18    Authorization Type  [facility]    PT Start Time  1115    PT Stop Time  1205    PT Time Calculation (min)  50 min    Activity Tolerance  Patient tolerated treatment well    Behavior During Therapy  [facility] for tasks assessed/performed       Past Medical History:  Diagnosis Date  . Asthma   . Depression     Past Surgical History:  Procedure Laterality Date  . PARTIAL HYSTERECTOMY  2015    There were no vitals filed for this visit.  Subjective Assessment - 05/21/18 1120    Subjective  Pt. noting R knee soreness and "locking" sentation on Sunday with awkward knee extension/lateral leaning/twisting motion leaning out over counter in bathroom.  Pt. noting she had to sit to relieve this pain.      Pertinent History  depression, asthma, R knee arthroscopy with lateral release 2015    Diagnostic tests  02/22/18 R knee MRI: Mild patellar cartilage degeneration as described above. No definite meniscal tear. However, there is a 6 x 18 x 7 mm loculated cystic lesion behind the medial meniscus posterior horn and root.     Patient Stated Goals  strengthening the knee to return to the gym    Currently in Pain?  No/denies    Pain Score  0-No pain   "locking" pain up to 9/10 on Sunday (see subjective)   Pain Location  Knee    Pain Orientation  Right    Pain Descriptors / Indicators  --   "locking"   Pain Type  Chronic pain    Multiple Pain Sites  No         [facility] PT Assessment - 05/21/18 0001      Assessment   Medical  Diagnosis  Chronic pain of R knee    Referring Provider (PT)  Jean Rosenthal, MD    Next MD Visit  06/03/18                   [facility] Adult PT Treatment/Exercise - 05/21/18 0001      Self-Care   Self-Care  Other Self-Care Comments    Other Self-Care Comments   Discussed "locking" epidode at R knee on Sunday with therapist and supervising PT      Knee/Hip Exercises: Stretches   Quad Stretch  Right;1 rep;60 seconds    Quad Stretch Limitations  prone with strap + bolster under thigh     Hip Flexor Stretch  Right;1 rep;60 seconds    Hip Flexor Stretch Limitations  R mod thomas postition with strap     ITB Stretch  Right;30 seconds;2 reps    ITB Stretch Limitations  strap      Knee/Hip Exercises: Aerobic   Elliptical  Lvl 3.0, 3 min    performed after pt. screened for 3 min lvl 3 on bike    Recumbent Bike  Lvl 3, 3 min    performed initial warmup  on bike to screen for R knee pain      Knee/Hip Exercises: Machines for Strengthening   Cybex Knee Flexion  B LE's 35# 2 x 15 reps      Knee/Hip Exercises: Supine   Bridges with Clamshell  Both;15 reps;2 sets   5" focusing on full hip extension each rep     Knee/Hip Exercises: Sidelying   Clams  B side planking with green TB x 12 reps       Vasopneumatic   Number Minutes Vasopneumatic   10 minutes    Vasopnuematic Location   Knee   R   Vasopneumatic Pressure  Medium    Vasopneumatic Temperature   coldest               PT Short Term Goals - 05/21/18 1154      PT SHORT TERM GOAL #1   Title  Patient to be independent with initial HEP.    Time  3    Period  Weeks    Status  Achieved    Target Date  04/01/18      PT SHORT TERM GOAL #2   Title  Patient to return to running for 5 minutes at a time without pain limiting.     Time  3    Period  Weeks    Status  Partially Met    Target Date  05/16/18        PT Long Term Goals - 04/30/18 1032      PT LONG TERM GOAL #1   Title  Patient to be  independent with advanced HEP.    Time  5    Period  Weeks    Status  On-going   04/25/18: met for current     PT LONG TERM GOAL #2   Title  Patient to demonstrate R knee AROM/PROM pain-free and symmetrical to L LE.    Time  5    Period  Weeks    Status  Partially Met   04/25/18: improvement in R knee flexion AROM/PROM but with mild discomfort with PROM     PT LONG TERM GOAL #3   Title  Patient to demonstrate mild tightness in R TFL.    Time  6    Period  Weeks    Status  Achieved      PT LONG TERM GOAL #4   Title  Patient to demonstrate good R quad stability and no pain with stair climbing up/down 13 steps with 1 handrail as needed.     Time  5    Period  Weeks    Status  Partially Met   04/25/18: audible R knee crepitus and 6/10 pain when descending down stairs; slight improvement with R quad stability and control     PT LONG TERM GOAL #5   Title  Patient to return to gym activities without pain limiting.     Time  5    Period  Weeks    Status  Partially Met   gym closed but walking 1 mile a couple times a week     PT LONG TERM GOAL #6   Title  Patient to return to running for 10 minutes at a time without pain limiting.     Time  5    Period  Weeks    Status  On-going            Plan - 05/21/18 1133    Clinical Impression Statement  Pt.  reports sensation of "locking" pain at R knee when she performed awkward twisting/leaning/R knee extended positioning while leaning out over bathroom counter.  This "locking" pain required her to sit and manually "work knee-cap" to allow knee to bend.  She has not experience this "locking" pain since Sunday and denies that this is common.  Pt. noting she was able to jog on Sunday for 1 min without significant knee pain.  Pt. has now partially met STG #2.  Tolerated all LE/proximal hip strengthening activities well today.  Did require cueing to ensure full hip extension with glute focused bridge strengthening reducing difficulty in order  to ensure proper contraction.  Pt. ended session with ice/compression to R knee to reduce post-exercise soreness and swelling per pt. request.      Personal Factors and Comorbidities  Past/Current Experience;Comorbidity 3+;Time since onset of injury/illness/exacerbation    Comorbidities  depression, asthma, R knee arthroscopy with lateral release     Examination-Activity Limitations  Bend;Sit;Carry;Squat;Stairs;Transfers;Lift;Locomotion Level;Stand    Examination-Participation Restrictions  Community Activity;Shop;Driving;Interpersonal Relationship;Yard Work;Laundry;Meal Prep    PT Treatment/Interventions  ADLs/Self Care Home Management;Cryotherapy;Electrical Stimulation;Iontophoresis 48m/ml Dexamethasone;Functional mobility training;Stair training;Gait training;Ultrasound;Moist Heat;Therapeutic activities;Therapeutic exercise;Balance training;Neuromuscular re-education;Patient/family education;Passive range of motion;Manual techniques;Dry needling;Energy conservation;Splinting;Taping;Vasopneumatic Device    PT Next Visit Plan  Monitor tolerane for jogging; glute strengthening; slowly increasing plyometrics; modalities prn; monitor if any locking episodes at R knee     Consulted and Agree with Plan of Care  Patient       Patient will benefit from skilled therapeutic intervention in order to improve the following deficits and impairments:  Abnormal gait, Decreased activity tolerance, Decreased strength, Increased fascial restricitons, Pain, Difficulty walking, Decreased balance, Decreased range of motion, Improper body mechanics, Postural dysfunction, Impaired flexibility  Visit Diagnosis: Chronic pain of right knee  Other symptoms and signs involving the musculoskeletal system     Problem List Patient Active Problem List   Diagnosis Date Noted  . Urge incontinence 08/01/2017  . Raynaud's disease, idiopathic 07/24/2017    MBess Harvest PTA 05/21/18 12:28 PM   [facility] [address] [place] [address]Phone: [phone]  Fax:  [phone] Name: CMEDRITH VEILLONMRN: [id number] of Birth: [birth year]

## 2018-05-23 ENCOUNTER — Other Ambulatory Visit: Payer: Self-pay

## 2018-05-23 ENCOUNTER — Encounter: Payer: Self-pay | Admitting: Physical Therapy

## 2018-05-23 ENCOUNTER — Ambulatory Visit: Payer: 59 | Admitting: Physical Therapy

## 2018-05-23 DIAGNOSIS — G8929 Other chronic pain: Secondary | ICD-10-CM

## 2018-05-23 DIAGNOSIS — R29898 Other symptoms and signs involving the musculoskeletal system: Secondary | ICD-10-CM

## 2018-05-23 DIAGNOSIS — M25561 Pain in right knee: Secondary | ICD-10-CM | POA: Diagnosis not present

## 2018-05-23 NOTE — Therapy (Signed)
[facility] [address]  [place], [place], [address] Phone: [phone]   Fax:  [phone]  Physical Therapy Treatment  Patient Details  Name: Morgan Oneal MRN: [id number] Date of Birth: [birth year] Referring Provider (PT): Jean Rosenthal, MD   Encounter Date: 05/23/2018  PT End of Session - 05/23/18 1041    Visit Number  17    Number of Visits  20    Date for PT Re-Evaluation  05/30/18    Authorization Type  [facility]    PT Start Time  0956    PT Stop Time  1053    PT Time Calculation (min)  57 min    Activity Tolerance  Patient tolerated treatment well;Patient limited by pain    Behavior During Therapy  [facility] for tasks assessed/performed       Past Medical History:  Diagnosis Date  . Asthma   . Depression     Past Surgical History:  Procedure Laterality Date  . PARTIAL HYSTERECTOMY  2015    There were no vitals filed for this visit.  Subjective Assessment - 05/23/18 0956    Subjective  Reports that since last session, her R knee swelled up and was having trouble sleeping last night. Today it is doing a little better.     Pertinent History  depression, asthma, R knee arthroscopy with lateral release 2015    Diagnostic tests  02/22/18 R knee MRI: Mild patellar cartilage degeneration as described above. No definite meniscal tear. However, there is a 6 x 18 x 7 mm loculated cystic lesion behind the medial meniscus posterior horn and root.     Patient Stated Goals  strengthening the knee to return to the gym    Currently in Pain?  Yes    Pain Score  6     Pain Location  Knee    Pain Orientation  Right;Anterior    Pain Descriptors / Indicators  Aching;Sharp    Pain Type  Chronic pain                       [facility] Adult PT Treatment/Exercise - 05/23/18 0001      Knee/Hip Exercises: Stretches   Passive Hamstring Stretch  Right;1 rep;30 seconds    Passive Hamstring Stretch Limitations   supine with strap    Quad Stretch  Right;2 reps;30 seconds    Quad Stretch Limitations  prone with strap     ITB Stretch  Right;30 seconds;2 reps    ITB Stretch Limitations  strap      Knee/Hip Exercises: Aerobic   Recumbent Bike  Lvl 1, 3 min       Knee/Hip Exercises: Supine   Quad Sets  Strengthening;Right;1 set;10 reps    Quad Sets Limitations  10x10" with towel roll under knee    Heel Slides  AAROM;Right;1 set;10 reps    Heel Slides Limitations  10x3" with strap on orange pball    Straight Leg Raises  Strengthening;Right;1 set;10 reps    Straight Leg Raises Limitations  focus on motor control and decreased speed    Straight Leg Raise with External Rotation  Strengthening;Right;1 set;10 reps    Straight Leg Raise with External Rotation Limitations  good control      Knee/Hip Exercises: Sidelying   Hip ABduction  Strengthening;Right;Left;1 set;15 reps    Hip ABduction Limitations  good control      Vasopneumatic   Number Minutes Vasopneumatic   15  minutes    Vasopnuematic Location   Knee   R   Vasopneumatic Pressure  Medium    Vasopneumatic Temperature   coldest      [employer]   Create Space  R knee chondromalacia taping pattern              PT Education - 05/23/18 1039    Education Details  update to HEP; advised to try ice massage to area of pain for 5 min; education on use of knee brace for improved stability at work    [employer]) Educated  Patient    Methods  Explanation;Demonstration;Tactile cues;Verbal cues;Handout    Comprehension  Verbalized understanding;Returned demonstration       PT Short Term Goals - 05/21/18 1154      PT SHORT TERM GOAL #1   Title  Patient to be independent with initial HEP.    Time  3    Period  Weeks    Status  Achieved    Target Date  04/01/18      PT SHORT TERM GOAL #2   Title  Patient to return to running for 5 minutes at a time without pain limiting.     Time  3    Period  Weeks    Status  Partially Met    Target  Date  05/16/18        PT Long Term Goals - 04/30/18 1032      PT LONG TERM GOAL #1   Title  Patient to be independent with advanced HEP.    Time  5    Period  Weeks    Status  On-going   04/25/18: met for current     PT LONG TERM GOAL #2   Title  Patient to demonstrate R knee AROM/PROM pain-free and symmetrical to L LE.    Time  5    Period  Weeks    Status  Partially Met   04/25/18: improvement in R knee flexion AROM/PROM but with mild discomfort with PROM     PT LONG TERM GOAL #3   Title  Patient to demonstrate mild tightness in R TFL.    Time  6    Period  Weeks    Status  Achieved      PT LONG TERM GOAL #4   Title  Patient to demonstrate good R quad stability and no pain with stair climbing up/down 13 steps with 1 handrail as needed.     Time  5    Period  Weeks    Status  Partially Met   04/25/18: audible R knee crepitus and 6/10 pain when descending down stairs; slight improvement with R quad stability and control     PT LONG TERM GOAL #5   Title  Patient to return to gym activities without pain limiting.     Time  5    Period  Weeks    Status  Partially Met   gym closed but walking 1 mile a couple times a week     PT LONG TERM GOAL #6   Title  Patient to return to running for 10 minutes at a time without pain limiting.     Time  5    Period  Weeks    Status  On-going            Plan - 05/23/18 1041    Clinical Impression Statement  Patient arrived to session with report of increased pain and edema in R  knee, starting while at work on Wednesday after last session. Patient reporting pain with full extension and flexion. Upon inspection, R knee edematous medial to tibial tubercle and superolateral aspect of patella. Educated patient on the use of ice massage and knee brace for pain and edema relief. Also advised patient to continue gentle stretching and ROM HEP. Updated HEP with exercises that were well-tolerated today. Patient reported understanding. Worked  on gentle LE stretching this session with cues to avoid pushing into pain. Patient tolerated quad and hip strengthening ther-ex with focus on motor control and decreased speed. Applied tape to R knee for pain relief. Ended session with [place] to R knee for pain and edema relief. Patient without further complaints at end of session.     Comorbidities  depression, asthma, R knee arthroscopy with lateral release     PT Treatment/Interventions  ADLs/Self Care Home Management;Cryotherapy;Electrical Stimulation;Iontophoresis 71m/ml Dexamethasone;Functional mobility training;Stair training;Gait training;Ultrasound;Moist Heat;Therapeutic activities;Therapeutic exercise;Balance training;Neuromuscular re-education;Patient/family education;Passive range of motion;Manual techniques;Dry needling;Energy conservation;Splinting;Taping;Vasopneumatic Device    PT Next Visit Plan  Monitor R knee pain and edema; Monitor tolerance for jogging; glute strengthening; slowly increasing plyometrics; modalities prn    Consulted and Agree with Plan of Care  Patient       Patient will benefit from skilled therapeutic intervention in order to improve the following deficits and impairments:  Abnormal gait, Decreased activity tolerance, Decreased strength, Increased fascial restricitons, Pain, Difficulty walking, Decreased balance, Decreased range of motion, Improper body mechanics, Postural dysfunction, Impaired flexibility  Visit Diagnosis: Chronic pain of right knee  Other symptoms and signs involving the musculoskeletal system     Problem List Patient Active Problem List   Diagnosis Date Noted  . Urge incontinence 08/01/2017  . Raynaud's disease, idiopathic 07/24/2017     YJanene Oneal PT, DPT 05/23/18 10:57 AM   [facility] [place] [address]Phone: [phone]  Fax:  [phone] Name: CJANITH NIELSONMRN:  [id number] of Birth: 721-Nov-1987

## 2018-05-28 ENCOUNTER — Ambulatory Visit: Payer: 59

## 2018-05-29 ENCOUNTER — Other Ambulatory Visit: Payer: Self-pay

## 2018-05-29 ENCOUNTER — Ambulatory Visit (INDEPENDENT_AMBULATORY_CARE_PROVIDER_SITE_OTHER): Payer: 59 | Admitting: Neurology

## 2018-05-29 DIAGNOSIS — R202 Paresthesia of skin: Secondary | ICD-10-CM | POA: Diagnosis not present

## 2018-05-29 NOTE — Procedures (Signed)
[facility]  [address], [address]  [place], [place] [address] Tel: [phone] Fax:  [phone] Test Date:  05/29/2018  Patient: Morgan Oneal DOB: 1985-05-07 Physician: Nita Sickleonika Nisaiah Bechtol, DO  Sex: Female Height: 5\' 7"  Ref Phys: Orland MustardAllison Wolfe, MD  ID#: [id number] Temp: 32.8C Technician:    Patient Complaints: This is a 33 year old female referred for evaluation of generalized paresthesias of the arms and legs.  NCV & EMG Findings: Extensive electrodiagnostic testing of the right upper and lower extremity shows:  1. All sensory responses including the right median, ulnar, mixed palmar, sural, and superficial peroneal nerves are within normal limits. 2. All motor responses including the right median, ulnar, peroneal, and tibial nerves are within normal limits. 3. There is no evidence of active or chronic motor axonal loss changes affecting any of the tested muscles.  Motor unit configuration and recruitment pattern is within normal limits.   Impression: This is a normal study of the right side.  In particular, there is no evidence of a sensorimotor polyneuropathy or cervical/lumbosacral radiculopathy.   ___________________________ Nita Sickleonika Li Fragoso, DO    Nerve Conduction Studies Anti Sensory Summary Table   Site NR Peak (ms) Norm Peak (ms) P-T Amp (V) Norm P-T Amp  Right Median Anti Sensory (2nd Digit)  32.8C  Wrist    2.8 <3.4 57.3 >20  Right Sup Peroneal Anti Sensory (Ant Lat Mall)  32.8C  12 cm    2.3 <4.5 24.2 >5  Right Sural Anti Sensory (Lat Mall)  32.8C  Calf    2.9 <4.5 23.3 >5  Right Ulnar Anti Sensory (5th Digit)  32.8C  Wrist    2.6 <3.1 37.4 >12   Motor Summary Table   Site NR Onset (ms) Norm Onset (ms) O-P Amp (mV) Norm O-P Amp Site1 Site2 Delta-0 (ms) Dist (cm) Vel (m/s) Norm Vel (m/s)  Right Median Motor (Abd Poll Brev)  32.8C  Wrist    3.3 <3.9 10.1 >6 Elbow Wrist 5.0 32.0 64 >50  Elbow    8.3  9.7         Right Peroneal Motor (Ext Dig  Brev)  32.8C  Ankle    4.5 <5.5 5.7 >3 B Fib Ankle 6.9 40.0 58 >40  B Fib    11.4  5.5  Poplt B Fib 1.6 8.0 50 >40  Poplt    13.0  5.3         Right Tibial Motor (Abd Hall Brev)  32.8C  Ankle    3.9 <6.0 11.4 >8 Knee Ankle 9.2 44.0 48 >40  Knee    13.1  9.8         Right Ulnar Motor (Abd Dig Minimi)  32.8C  Wrist    2.6 <3.1 8.6 >7 B Elbow Wrist 4.6 26.0 57 >50  B Elbow    7.2  8.3  A Elbow B Elbow 1.7 10.0 59 >50  A Elbow    8.9  7.8          Comparison Summary Table   Site NR Peak (ms) Norm Peak (ms) P-T Amp (V) Site1 Site2 Delta-P (ms) Norm Delta (ms)  Right Median/Ulnar Palm Comparison (Wrist - 8cm)  32.8C  Median Palm    1.6 <2.2 53.6 Median Palm Ulnar Palm 0.1   Ulnar Palm    1.5 <2.2 13.6       H Reflex Studies   NR H-Lat (ms) Lat Norm (ms) L-R H-Lat (ms)  Right Tibial (Gastroc)  32.8C  30.34 <35    EMG   Side Muscle Ins Act Fibs Psw Fasc Number Recrt Dur Dur. Amp Amp. Poly Poly. Comment  Right AntTibialis Nml Nml Nml Nml Nml Nml Nml Nml Nml Nml Nml Nml N/A  Right Gastroc Nml Nml Nml Nml Nml Nml Nml Nml Nml Nml Nml Nml N/A  Right Flex Dig Long Nml Nml Nml Nml Nml Nml Nml Nml Nml Nml Nml Nml N/A  Right RectFemoris Nml Nml Nml Nml Nml Nml Nml Nml Nml Nml Nml Nml N/A  Right GluteusMed Nml Nml Nml Nml Nml Nml Nml Nml Nml Nml Nml Nml N/A  Right 1stDorInt Nml Nml Nml Nml Nml Nml Nml Nml Nml Nml Nml Nml N/A  Right PronatorTeres Nml Nml Nml Nml Nml Nml Nml Nml Nml Nml Nml Nml N/A  Right Biceps Nml Nml Nml Nml Nml Nml Nml Nml Nml Nml Nml Nml N/A  Right Triceps Nml Nml Nml Nml Nml Nml Nml Nml Nml Nml Nml Nml N/A  Right Deltoid Nml Nml Nml Nml Nml Nml Nml Nml Nml Nml Nml Nml N/A      Waveforms:

## 2018-05-30 ENCOUNTER — Ambulatory Visit: Payer: 59 | Admitting: Physical Therapy

## 2018-05-30 ENCOUNTER — Encounter: Payer: Self-pay | Admitting: Physical Therapy

## 2018-05-30 DIAGNOSIS — R29898 Other symptoms and signs involving the musculoskeletal system: Secondary | ICD-10-CM

## 2018-05-30 DIAGNOSIS — G8929 Other chronic pain: Secondary | ICD-10-CM | POA: Diagnosis not present

## 2018-05-30 DIAGNOSIS — M25561 Pain in right knee: Secondary | ICD-10-CM | POA: Diagnosis not present

## 2018-05-30 NOTE — Therapy (Addendum)
[facility] [address]  [place], [place], [address] Phone: [phone]   Fax:  [phone]  Physical Therapy Treatment  Patient Details  Name: TESIA LYBRAND MRN: [id number] Date of Birth: Mar 18, 1985 Referring Provider (PT): Jean Rosenthal, MD   Encounter Date: 05/30/2018  PT End of Session - 05/30/18 1100    Visit Number  18    Number of Visits  20    Date for PT Re-Evaluation  05/30/18    Authorization Type  [facility]    PT Start Time  1005    PT Stop Time  1052    PT Time Calculation (min)  47 min    Activity Tolerance  Patient tolerated treatment well;Patient limited by pain    Behavior During Therapy  [facility] for tasks assessed/performed       Past Medical History:  Diagnosis Date  . Asthma   . Depression     Past Surgical History:  Procedure Laterality Date  . PARTIAL HYSTERECTOMY  2015    There were no vitals filed for this visit.  Subjective Assessment - 05/30/18 1004    Subjective  Reports that she feels her knee pain is a little worse from the rain. Did a lot of walking while working at [employer] and that made it a bit sore. Reports that she is still recovering from the recent flare up in her knee. Feels like she her leg is weaker and has noticed more clicking in her knee. Reports that she is 70% ot her goal. Would like to continue strengthening her R LE.     Pertinent History  depression, asthma, R knee arthroscopy with lateral release 2015    Diagnostic tests  02/22/18 R knee MRI: Mild patellar cartilage degeneration as described above. No definite meniscal tear. However, there is a 6 x 18 x 7 mm loculated cystic lesion behind the medial meniscus posterior horn and root.     Patient Stated Goals  strengthening the knee to return to the gym    Currently in Pain?  No/denies    Pain Score  2     Pain Location  Knee    Pain Orientation  Right;Anterior    Pain Descriptors / Indicators   Aching;Sharp    Pain Type  Chronic pain         [facility] PT Assessment - 05/30/18 0001      AROM   Right/Left Knee  Right    Right Knee Extension  0    Right Knee Flexion  127   pain     PROM   Right/Left Knee  Right    Right Knee Extension  -1    Right Knee Flexion  127   pain                  [facility] Adult PT Treatment/Exercise - 05/30/18 0001      Ambulation/Gait   Stairs  Yes    Stairs Assistance  7: Independent    Stair Management Technique  No rails    Number of Stairs  26    Height of Stairs  8    Gait Comments  patient with tendency to run up/down steps,       Knee/Hip Exercises: Stretches   Passive Hamstring Stretch  Right;1 rep;30 seconds    Passive Hamstring Stretch Limitations  supine with strap    Quad Stretch  Right;2 reps;30 seconds    Quad Stretch Limitations  prone with strap     ITB Stretch  Right;30 seconds;2 reps    ITB Stretch Limitations  strap    Other Knee/Hip Stretches  R & L pigeon pose off edge of mat 2x30"      Knee/Hip Exercises: Aerobic   Recumbent Bike  L3 x 6 min      Knee/Hip Exercises: Standing   Terminal Knee Extension  Right;Theraband;Strengthening;15 reps    Theraband Level (Terminal Knee Extension)  Level 4 (Blue)    Terminal Knee Extension Limitations  15x5" with 1 UE support on chair   towel behind knee     Knee/Hip Exercises: Sidelying   Clams  clam + reverse clam with green TB at knees, yellow at ankles 10 each LE   cues for proper movement pattern     Knee/Hip Exercises: Prone   Other Prone Exercises  Plank on elbows/toes + hip extension 2x10   cues to maintain straight knee and hips down     [employer]   Create Space  R knee chondromalacia taping pattern                PT Short Term Goals - 05/30/18 1006      PT SHORT TERM GOAL #1   Title  Patient to be independent with initial HEP.    Time  3    Period  Weeks    Status  Achieved    Target Date  04/01/18      PT SHORT TERM GOAL #2    Title  Patient to return to running for 5 minutes at a time without pain limiting.     Time  3    Period  Weeks    Status  Partially Met   tried running 1 min a couple weeks ago, but not recently d/t R knee pain and weather   Target Date  05/16/18        PT Long Term Goals - 05/30/18 1006      PT LONG TERM GOAL #1   Title  Patient to be independent with advanced HEP.    Time  5    Period  Weeks    Status  On-going   05/30/18: met for current     PT LONG TERM GOAL #2   Title  Patient to demonstrate R knee AROM/PROM pain-free and symmetrical to L LE.    Time  5    Period  Weeks    Status  Partially Met   05/30/18: slight decrease in R knee flexion ROM and with c/o pain at end range     PT LONG TERM GOAL #3   Title  Patient to demonstrate mild tightness in R TFL.    Time  6    Period  Weeks    Status  Achieved      PT LONG TERM GOAL #4   Title  Patient to demonstrate good R quad stability and no pain with stair climbing up/down 13 steps with 1 handrail as needed.     Time  5    Period  Weeks    Status  Partially Met   05/30/18: patient with c/o R knee crepitus and 4/10 pain when ascending/descending stairs with control; slight improvement with R quad stability and control     PT LONG TERM GOAL #5   Title  Patient to return to gym activities without pain limiting.     Time  5    Period  Weeks  Status  Partially Met   gym closed but walking 1 mile a couple times a week     PT LONG TERM GOAL #6   Title  Patient to return to running for 10 minutes at a time without pain limiting.     Time  5    Period  Weeks    Status  On-going            Plan - 05/30/18 1126    Clinical Impression Statement  Patient arrived to session with report of 70% improvement in R knee since starting PT. However, feels that recent flare up is not fully resolved, as she notes increased weakness and crepitus in the knee now. Would like to continue working on strengthening her R LE. Patient  today demonstrating slight decrease in R knee flexion ROM and with c/o pain at end range. Has avoided trying running activities d/t increased knee pain as a result of her flare up. Patient with c/o R knee crepitus and 4/10 pain when instructed to ascend/descend stairs with control, otherwise with tendency to run up/downstairs. Worked on progressive hip strengthening ther-ex this session. Introduced reverse clamshell with banded resistance with minimal cues for proper positioning. Patient with considerable difficulty and fatigue with plank with hip extension, and tendency to drop hips d/t weakness in core. Ended session with KT tape to R knee for pain and edema relief, and medial knee slightly edematous at beginning of session. Patient without complaints at end of session.     Comorbidities  depression, asthma, R knee arthroscopy with lateral release     PT Treatment/Interventions  ADLs/Self Care Home Management;Cryotherapy;Electrical Stimulation;Iontophoresis 7m/ml Dexamethasone;Functional mobility training;Stair training;Gait training;Ultrasound;Moist Heat;Therapeutic activities;Therapeutic exercise;Balance training;Neuromuscular re-education;Patient/family education;Passive range of motion;Manual techniques;Dry needling;Energy conservation;Splinting;Taping;Vasopneumatic Device    PT Next Visit Plan  Monitor R knee pain and edema; Monitor tolerance for jogging; glute strengthening; slowly increasing plyometrics; modalities prn    Consulted and Agree with Plan of Care  Patient       Patient will benefit from skilled therapeutic intervention in order to improve the following deficits and impairments:  Abnormal gait, Decreased activity tolerance, Decreased strength, Increased fascial restricitons, Pain, Difficulty walking, Decreased balance, Decreased range of motion, Improper body mechanics, Postural dysfunction, Impaired flexibility  Visit Diagnosis: Chronic pain of right knee  Other symptoms and signs  involving the musculoskeletal system     Problem List Patient Active Problem List   Diagnosis Date Noted  . Urge incontinence 08/01/2017  . Raynaud's disease, idiopathic 07/24/2017    YJanene Harvey PT, DPT 05/30/18 11:27 AM    [facility] [place] [address]Phone: [phone]  Fax:  [phone] Name: CPATRIA WARZECHAMRN: [id number] of Birth: 7December 15, 1987 PHYSICAL THERAPY DISCHARGE SUMMARY  Visits from Start of Care: 18  Current functional level related to goals / functional outcomes: Unable to assess; patient called requesting to be placed on 30 day hold   Remaining deficits: Unable to assess   Education / Equipment: HEP  Plan: Patient agrees to discharge.  Patient goals were partially met. Patient is being discharged due to the patient's request.  ?????     YJanene Harvey PT, DPT 07/07/18 9:10 AM

## 2018-06-03 ENCOUNTER — Ambulatory Visit: Payer: 59 | Admitting: Orthopaedic Surgery

## 2018-06-04 ENCOUNTER — Encounter: Payer: Self-pay | Admitting: Family Medicine

## 2018-06-06 ENCOUNTER — Ambulatory Visit: Payer: 59 | Admitting: Physical Therapy

## 2018-06-10 ENCOUNTER — Other Ambulatory Visit: Payer: Self-pay

## 2018-06-10 ENCOUNTER — Encounter: Payer: Self-pay | Admitting: Orthopaedic Surgery

## 2018-06-10 ENCOUNTER — Ambulatory Visit (INDEPENDENT_AMBULATORY_CARE_PROVIDER_SITE_OTHER): Payer: 59 | Admitting: Orthopaedic Surgery

## 2018-06-10 ENCOUNTER — Encounter: Payer: 59 | Admitting: Neurology

## 2018-06-10 DIAGNOSIS — M25561 Pain in right knee: Secondary | ICD-10-CM | POA: Diagnosis not present

## 2018-06-10 DIAGNOSIS — G8929 Other chronic pain: Secondary | ICD-10-CM

## 2018-06-10 NOTE — Progress Notes (Signed)
   Office Visit Note   Patient: Morgan Oneal           Date of Birth: May 21, 1985           MRN: [id number] Visit Date: 06/10/2018              Requested by: Orma Flaming, [place],  [id number] PCP: Orma Flaming, MD   Assessment & Plan: Visit Diagnoses:  1. Chronic pain of right knee     Plan: She will continue to work with therapy.  Discussed knee friendly exercises with her at length.  She is to avoid deep squats lunges.  Talked with her about weight control home health this can definitely play a role in her knee pain.  She will try turmeric and see if this helps some with her knee pain.  Follow-up with [place] on as-needed basis.  Follow-Up Instructions: Return if symptoms worsen or fail to improve.   Orders:  No orders of the defined types were placed in this encounter.  No orders of the defined types were placed in this encounter.     Procedures: No procedures performed   Clinical Data: No additional findings.   Subjective: Chief Complaint  Patient presents with  . Right Knee - Follow-up    HPI Morgan Oneal returns today follow-up of her right knee pain.  She states she has good and bad days.  She was doing well until she had one incident of knee locking up on her.  States knee still feels weak.  She is working with physical therapy they are doing genetic taping at night taught her how to do this herself.  She finds the kinetic taping to be very beneficial.  She is taking no anti-inflammatories. Review of Systems No fevers or chills.  Objective: Vital Signs: LMP  (LMP Unknown)   Physical Exam General: Well-developed well-nourished female no acute distress Ortho Exam Right knee full extension full flexion without significant pain.  She does have significant patellofemoral crepitus with slight discomfort.  No abnormal warmth erythema or effusion of the right knee. Specialty Comments:  No specialty comments available.  Imaging: No  results found.   PMFS History: Patient Active Problem List   Diagnosis Date Noted  . Urge incontinence 08/01/2017  . Raynaud's disease, idiopathic 07/24/2017   Past Medical History:  Diagnosis Date  . Asthma   . Depression     Family History  Problem Relation Age of Onset  . Arthritis Father   . Arthritis Maternal Grandmother   . Diabetes Maternal Grandmother   . Arthritis Paternal Grandmother   . Asthma Paternal Grandmother   . Cancer Paternal Grandmother   . Alcohol abuse Paternal Grandfather     Past Surgical History:  Procedure Laterality Date  . PARTIAL HYSTERECTOMY  2015   Social History   Occupational History  . Not on file  Tobacco Use  . Smoking status: Never Smoker  . Smokeless tobacco: Never Used  Substance and Sexual Activity  . Alcohol use: Yes    Comment: maybe once/week   . Drug use: Never  . Sexual activity: Yes    Partners: Male    Birth control/protection: Surgical

## 2018-06-11 ENCOUNTER — Ambulatory Visit: Payer: 59 | Attending: Orthopaedic Surgery | Admitting: Physical Therapy

## 2018-06-13 ENCOUNTER — Ambulatory Visit: Payer: 59 | Admitting: Neurology

## 2018-08-11 ENCOUNTER — Encounter: Payer: Self-pay | Admitting: Family Medicine

## 2018-08-11 ENCOUNTER — Other Ambulatory Visit: Payer: Self-pay

## 2018-08-11 ENCOUNTER — Ambulatory Visit (INDEPENDENT_AMBULATORY_CARE_PROVIDER_SITE_OTHER): Payer: 59 | Admitting: Family Medicine

## 2018-08-11 ENCOUNTER — Other Ambulatory Visit (HOSPITAL_COMMUNITY)
Admission: RE | Admit: 2018-08-11 | Discharge: 2018-08-11 | Disposition: A | Discharge status: Home / Self Care | Payer: 59 | Source: Ambulatory Visit | Attending: Family Medicine | Admitting: Family Medicine

## 2018-08-11 VITALS — BP 122/80 | HR 93 | Temp 98.1°F | Ht 67.0 in | Wt 190.8 lb

## 2018-08-11 DIAGNOSIS — Z113 Encounter for screening for infections with a predominantly sexual mode of transmission: Secondary | ICD-10-CM | POA: Diagnosis not present

## 2018-08-11 DIAGNOSIS — Z0000 Encounter for general adult medical examination without abnormal findings: Secondary | ICD-10-CM | POA: Diagnosis not present

## 2018-08-11 DIAGNOSIS — F339 Major depressive disorder, recurrent, unspecified: Secondary | ICD-10-CM

## 2018-08-11 LAB — COMPREHENSIVE METABOLIC PANEL
ALT: 11 U/L (ref 0–35)
AST: 14 U/L (ref 0–37)
Albumin: 4.2 g/dL (ref 3.5–5.2)
Alkaline Phosphatase: 59 U/L (ref 39–117)
BUN: 13 mg/dL (ref 6–23)
CO2: 27 mEq/L (ref 19–32)
Calcium: 9.6 mg/dL (ref 8.4–10.5)
Chloride: 101 mEq/L (ref 96–112)
Creatinine, Ser: 1.1 mg/dL (ref 0.40–1.20)
GFR: 68.77 mL/min (ref >60.00)
Glucose, Bld: 94 mg/dL (ref 70–99)
Potassium: 4.2 mEq/L (ref 3.5–5.1)
Sodium: 136 mEq/L (ref 135–145)
Total Bilirubin: 0.4 mg/dL (ref 0.2–1.2)
Total Protein: 7.5 g/dL (ref 6.0–8.3)

## 2018-08-11 LAB — CBC WITH DIFFERENTIAL/PLATELET
Basophils Absolute: 0.1 10*3/uL (ref 0.0–0.1)
Basophils Relative: 0.8 % (ref 0.0–3.0)
Eosinophils Absolute: 0.2 10*3/uL (ref 0.0–0.7)
Eosinophils Relative: 2.5 % (ref 0.0–5.0)
HCT: 40.7 % (ref 36.0–46.0)
Hemoglobin: 13.8 g/dL (ref 12.0–15.0)
Lymphocytes Relative: 34.4 % (ref 12.0–46.0)
Lymphs Abs: 2.6 10*3/uL (ref 0.7–4.0)
MCHC: 33.8 g/dL (ref 30.0–36.0)
MCV: 91.6 fl (ref 78.0–100.0)
Monocytes Absolute: 0.6 10*3/uL (ref 0.1–1.0)
Monocytes Relative: 7.8 % (ref 3.0–12.0)
Neutro Abs: 4.1 10*3/uL (ref 1.4–7.7)
Neutrophils Relative %: 54.5 % (ref 43.0–77.0)
Platelets: 264 10*3/uL (ref 150.0–400.0)
RBC: 4.44 Mil/uL (ref 3.87–5.11)
RDW: 13.5 % (ref 11.5–15.5)
WBC: 7.5 10*3/uL (ref 4.0–10.5)

## 2018-08-11 LAB — TSH: TSH: 1.28 u[IU]/mL (ref 0.35–4.50)

## 2018-08-11 LAB — LIPID PANEL
Cholesterol: 190 mg/dL (ref 0–200)
HDL: 57.9 mg/dL (ref >39.00)
LDL Cholesterol: 115 mg/dL — ABNORMAL HIGH (ref 0–99)
NonHDL: 132.44
Total CHOL/HDL Ratio: 3
Triglycerides: 86 mg/dL (ref 0.0–149.0)
VLDL: 17.2 mg/dL (ref 0.0–40.0)

## 2018-08-11 LAB — VITAMIN D 25 HYDROXY (VIT D DEFICIENCY, FRACTURES): VITD: 25.25 ng/mL — ABNORMAL LOW (ref 30.00–100.00)

## 2018-08-11 MED ORDER — VALACYCLOVIR HCL 500 MG PO TABS: 500.0000 mg | ORAL_TABLET | Freq: Every day | ORAL | 1 refills | Status: DC

## 2018-08-11 MED ORDER — MONTELUKAST SODIUM 10 MG PO TABS: 10.0000 mg | ORAL_TABLET | Freq: Every day | ORAL | 1 refills | Status: DC

## 2018-08-11 MED ORDER — FLUTICASONE PROPIONATE 50 MCG/ACT NA SUSP: 1.0000 | Freq: Every day | NASAL | 3 refills | Status: DC

## 2018-08-11 MED ORDER — LORATADINE 10 MG PO TABS: 10.0000 mg | ORAL_TABLET | Freq: Every day | ORAL | 1 refills | Status: DC

## 2018-08-11 NOTE — Patient Instructions (Signed)
F/u in 3 months for depression. Start exercising and counseling... let me know if getting worse.   Preventive Care 47-34 Years Old, Female Preventive care refers to visits with your health care provider and lifestyle choices that can promote health and wellness. This includes:  A yearly physical exam. This may also be called an annual well check.  Regular dental visits and eye exams.  Immunizations.  Screening for certain conditions.  Healthy lifestyle choices, such as eating a healthy diet, getting regular exercise, not using drugs or products that contain nicotine and tobacco, and limiting alcohol use. What can I expect for my preventive care visit? Physical exam Your health care provider will check your:  Height and weight. This may be used to calculate body mass index (BMI), which tells if you are at a healthy weight.  Heart rate and blood pressure.  Skin for abnormal spots. Counseling Your health care provider may ask you questions about your:  Alcohol, tobacco, and drug use.  Emotional well-being.  Home and relationship well-being.  Sexual activity.  Eating habits.  Work and work Statistician.  Method of birth control.  Menstrual cycle.  Pregnancy history. What immunizations do I need?  Influenza (flu) vaccine  This is recommended every year. Tetanus, diphtheria, and pertussis (Tdap) vaccine  You may need a Td booster every 10 years. Varicella (chickenpox) vaccine  You may need this if you have not been vaccinated. Human papillomavirus (HPV) vaccine  If recommended by your health care provider, you may need three doses over 6 months. Measles, mumps, and rubella (MMR) vaccine  You may need at least one dose of MMR. You may also need a second dose. Meningococcal conjugate (MenACWY) vaccine  One dose is recommended if you are age 29-21 years and a first-year college student living in a residence hall, or if you have one of several medical conditions.  You may also need additional booster doses. Pneumococcal conjugate (PCV13) vaccine  You may need this if you have certain conditions and were not previously vaccinated. Pneumococcal polysaccharide (PPSV23) vaccine  You may need one or two doses if you smoke cigarettes or if you have certain conditions. Hepatitis A vaccine  You may need this if you have certain conditions or if you travel or work in places where you may be exposed to hepatitis A. Hepatitis B vaccine  You may need this if you have certain conditions or if you travel or work in places where you may be exposed to hepatitis B. Haemophilus influenzae type b (Hib) vaccine  You may need this if you have certain conditions. You may receive vaccines as individual doses or as more than one vaccine together in one shot (combination vaccines). Talk with your health care provider about the risks and benefits of combination vaccines. What tests do I need?  Blood tests  Lipid and cholesterol levels. These may be checked every 5 years starting at age 72.  Hepatitis C test.  Hepatitis B test. Screening  Diabetes screening. This is done by checking your blood sugar (glucose) after you have not eaten for a while (fasting).  Sexually transmitted disease (STD) testing.  BRCA-related cancer screening. This may be done if you have a family history of breast, ovarian, tubal, or peritoneal cancers.  Pelvic exam and Pap test. This may be done every 3 years starting at age 86. Starting at age 77, this may be done every 5 years if you have a Pap test in combination with an HPV test. Talk with  your health care provider about your test results, treatment options, and if necessary, the need for more tests. Follow these instructions at home: Eating and drinking   Eat a diet that includes fresh fruits and vegetables, whole grains, lean protein, and low-fat dairy.  Take vitamin and mineral supplements as recommended by your health care  provider.  Do not drink alcohol if: ? Your health care provider tells you not to drink. ? You are pregnant, may be pregnant, or are planning to become pregnant.  If you drink alcohol: ? Limit how much you have to 0-1 drink a day. ? Be aware of how much alcohol is in your drink. In the [place] one drink equals one 12 oz bottle of beer (355 mL), one 5 oz glass of wine (148 mL), or one 1 oz glass of hard liquor (44 mL). Lifestyle  Take daily care of your teeth and gums.  Stay active. Exercise for at least 30 minutes on 5 or more days each week.  Do not use any products that contain nicotine or tobacco, such as cigarettes, e-cigarettes, and chewing tobacco. If you need help quitting, ask your health care provider.  If you are sexually active, practice safe sex. Use a condom or other form of birth control (contraception) in order to prevent pregnancy and STIs (sexually transmitted infections). If you plan to become pregnant, see your health care provider for a preconception visit. What's next?  Visit your health care provider once a year for a well check visit.  Ask your health care provider how often you should have your eyes and teeth checked.  Stay up to date on all vaccines. This information is not intended to replace advice given to you by your health care provider. Make sure you discuss any questions you have with your health care provider. Document Released: 02/13/2001 Document Revised: 08/29/2017 Document Reviewed: 08/29/2017 Elsevier Patient Education  2020 [employer].

## 2018-08-11 NOTE — Progress Notes (Signed)
Patient: Morgan Oneal MRN: [id number] DOB: 10-01-1985 PCP: Orland MustardWolfe, Venancio Chenier, MD     Subjective:  Chief Complaint  Patient presents with  . Annual Exam    HPI: The patient is a 33 y.o. female who presents today for annual exam. She denies any changes to past medical history. There have been no recent hospitalizations. They are following a well balanced diet and not exercising. Weight has been increasing. No complaints today.   No breast cancer or colon cancer history. Dad had covid 19 with hospitalization. No exposure to him.   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 Was given lexapro 10mg  and took this and never followed up so has not been taking anything. She denies any si/hi//ah/vh. She is coping well. Does not want medication, but is interested in counseling. She is not exercising at all. She tends to eat when depression is worse. Depression stems from childhood/family issues.   Immunization History  Administered Date(s) Administered  . Influenza,inj,Quad PF,6+ Mos 09/25/2016  . Influenza-Unspecified 08/26/2017  . PPD Test 10/09/2017  . Tdap 06/14/2017   Pap smear: utd and normal. Continues to need.  Std checking today.     Review of Systems  Constitutional: Negative for chills, fatigue and fever.  HENT: Negative for dental problem, ear pain, hearing loss and trouble swallowing.   Eyes: Negative for visual disturbance.  Respiratory: Negative for cough, chest tightness and shortness of breath.   Cardiovascular: Negative for chest pain, palpitations and leg swelling.  Gastrointestinal: Negative for abdominal pain, blood in stool, diarrhea, nausea and vomiting.  Endocrine: Negative for cold intolerance, polydipsia, polyphagia and polyuria.  Genitourinary: Negative for dysuria and hematuria.  Musculoskeletal: Positive for arthralgias. Negative for myalgias and neck pain.       C/o chronic R knee pain  Skin: Negative for rash.  Neurological:  Positive for headaches. Negative for dizziness.  Psychiatric/Behavioral: Negative for dysphoric mood, sleep disturbance and suicidal ideas. The patient is not nervous/anxious.     Allergies Patient is allergic to mucinex [guaifenesin er] and penicillins.  Past Medical History Patient  has a past medical history of Asthma and Depression.  Surgical History Patient  has a past surgical history that includes Partial hysterectomy (2015).  Family History Pateint's family history includes Alcohol abuse in her paternal grandfather; Arthritis in her father, maternal grandmother, and paternal grandmother; Asthma in her paternal grandmother; Cancer in her paternal grandmother; Diabetes in her maternal grandmother.  Social History Patient  reports that she has never smoked. She has never used smokeless tobacco. She reports current alcohol use. She reports that she does not use drugs.    Objective: There were no vitals filed for this visit.  There is no height or weight on file to calculate BMI.  Physical Exam Vitals signs reviewed.  Constitutional:      Appearance: Normal appearance. She is well-developed.  HENT:     Head: Normocephalic and atraumatic.     Right Ear: Tympanic membrane, ear canal and external ear normal.     Left Ear: Tympanic membrane, ear canal and external ear normal.     Nose: Nose normal.  Eyes:     Extraocular Movements: Extraocular movements intact.     Conjunctiva/sclera: Conjunctivae normal.     Pupils: Pupils are equal, round, and reactive to light.  Neck:     Musculoskeletal: Normal range of motion and neck supple.     Thyroid: No thyromegaly.  Cardiovascular:     Rate and Rhythm:  Normal rate and regular rhythm.     Pulses: Normal pulses.     Heart sounds: Normal heart sounds. No murmur.  Pulmonary:     Effort: Pulmonary effort is normal.     Breath sounds: Normal breath sounds.  Abdominal:     General: Bowel sounds are normal. There is no distension.      Palpations: Abdomen is soft.     Tenderness: There is no abdominal tenderness.  Lymphadenopathy:     Cervical: No cervical adenopathy.  Skin:    General: Skin is warm and dry.     Findings: No rash.  Neurological:     General: No focal deficit present.     Mental Status: She is alert and oriented to person, place, and time.     Cranial Nerves: No cranial nerve deficit.     Coordination: Coordination normal.     Deep Tendon Reflexes: Reflexes normal.  Psychiatric:        Mood and Affect: Mood normal.        Behavior: Behavior normal.     Comments: No si/hi/ah/vh           Office Visit from 08/11/2018 in [facility]  PHQ-2 Total Score  4       Office Visit from 08/11/2018 in [facility]  PHQ-9 Total Score  21      Assessment/plan: 1. Annual physical exam Refilled her allergy medication. utd on HM. Routine lab work today. covid precautions given and recommended flu shot. She will get at work. F/u in one year for annual or as needed. Encouraged exercise and healthy diet for depression. Std screening today.  Patient counseling [x]    Nutrition: Stressed importance of moderation in sodium/caffeine intake, saturated fat and cholesterol, caloric balance, sufficient intake of fresh fruits, vegetables, fiber, calcium, iron, and 1 mg of folate supplement per day (for females capable of pregnancy).  [x]    Stressed the importance of regular exercise.   [x]    Substance Abuse: Discussed cessation/primary prevention of tobacco, alcohol, or other drug use; driving or other dangerous activities under the influence; availability of treatment for abuse.   [x]    Injury prevention: Discussed safety belts, safety helmets, smoke detector, smoking near bedding or upholstery.   [x]    Sexuality: Discussed sexually transmitted diseases, partner selection, use of condoms, avoidance of unintended pregnancy  and contraceptive alternatives.  [x]    Dental health:  Discussed importance of regular tooth brushing, flossing, and dental visits.  [x]    Health maintenance and immunizations reviewed. Please refer to Health maintenance section.    - Comprehensive metabolic panel - CBC with Differential/Platelet - TSH - Lipid panel  2. Depression, recurrent ([place]) Declines medication at this point and has lived with for a while. She states she has good days and bad days and copes well. She is wanting to do counseling only at this point. Information given for counseling. Also recommended she start an exercise program with the counseling. Will do close f/u in 3 months to see how she is doing and see if we need to add medication. Any si/hi she is call 911 or go to ER.  - VITAMIN D 25 Hydroxy (Vit-D Deficiency, Fractures)  3. Screen for STD (sexually transmitted disease)  - Urine cytology ancillary only([place])   Return in about 3 months (around 11/11/2018) for virtual visit for depression/phq9. schedule for thursday. Orma Flaming, MD [place]  08/11/2018

## 2018-08-12 LAB — URINE CYTOLOGY ANCILLARY ONLY
Chlamydia: NEGATIVE
Neisseria Gonorrhea: NEGATIVE
Trichomonas: NEGATIVE

## 2018-08-13 ENCOUNTER — Other Ambulatory Visit: Payer: Self-pay | Admitting: Family Medicine

## 2018-08-13 MED ORDER — VITAMIN D (ERGOCALCIFEROL) 1.25 MG (50000 UNIT) PO CAPS: ORAL_CAPSULE | ORAL | 0 refills | Status: DC

## 2018-09-08 ENCOUNTER — Encounter: Payer: Self-pay | Admitting: Family Medicine

## 2018-09-09 ENCOUNTER — Encounter: Payer: Self-pay | Admitting: Family Medicine

## 2018-09-11 ENCOUNTER — Other Ambulatory Visit: Payer: Self-pay

## 2018-09-11 ENCOUNTER — Ambulatory Visit (INDEPENDENT_AMBULATORY_CARE_PROVIDER_SITE_OTHER): Payer: 59 | Admitting: Family Medicine

## 2018-09-11 ENCOUNTER — Encounter: Payer: Self-pay | Admitting: Family Medicine

## 2018-09-11 VITALS — Ht 67.0 in | Wt 192.0 lb

## 2018-09-11 DIAGNOSIS — F32A Depression, unspecified: Secondary | ICD-10-CM

## 2018-09-11 DIAGNOSIS — F419 Anxiety disorder, unspecified: Secondary | ICD-10-CM

## 2018-09-11 DIAGNOSIS — F329 Major depressive disorder, single episode, unspecified: Secondary | ICD-10-CM | POA: Diagnosis not present

## 2018-09-11 MED ORDER — FLUCONAZOLE 150 MG PO TABS: 150.0000 mg | ORAL_TABLET | Freq: Once | ORAL | 0 refills | Status: AC

## 2018-09-11 MED ORDER — HYDROXYZINE HCL 25 MG PO TABS: 25.0000 mg | ORAL_TABLET | Freq: Three times a day (TID) | ORAL | 1 refills | Status: DC | PRN

## 2018-09-11 MED ORDER — FLUOXETINE HCL 20 MG PO TABS: 20.0000 mg | ORAL_TABLET | Freq: Every day | ORAL | 1 refills | Status: DC

## 2018-09-11 NOTE — Progress Notes (Signed)
Patient: Morgan Oneal MRN: [id number] DOB: Aug 09, 1985 PCP: Orland MustardWolfe, Aizlynn Digilio, MD     Subjective:  Chief Complaint  Patient presents with  . Anxiety  . Depression    HPI: The patient is a 33 y.o. female who presents today for anxiety and depression. She has been trying to cope, but can not anymore. She has had some stressors recently including her mother moving in with her and battle with her grandmother over custody of her 2 kids.  She is having some what appears to be panic attacks with chest tightness and shortness of breath. Will also have diarrhea. She will try to deal with it on her own but then has physical manifestations of anxiety. She has a known diagnosis of anxiety and depression and has been on lexapro in the past which she self stopped years ago. She was put on this after she had her son in 2014 and took it for a year. She can' remember if it worked well or not. No side effects. She has no si/hi/ah/vh. No family history of depression/anxiety although she thinks her mother has depression. She is not exercising. She is not in counseling.   Review of Systems  Constitutional: Positive for fatigue.  Respiratory: Positive for chest tightness and shortness of breath.        Feels that chest tightness/chest pain/SOB are related to her anxiety  Cardiovascular: Positive for chest pain.  Gastrointestinal: Positive for abdominal pain, diarrhea and nausea. Negative for vomiting.       Feels that her nausea/abdominal pain/diarrhea are related to her anxiety  Musculoskeletal: Negative for arthralgias, back pain, myalgias and neck pain.  Neurological: Negative for dizziness and headaches.  Psychiatric/Behavioral: Positive for sleep disturbance.    Allergies Patient is allergic to mucinex [guaifenesin er] and penicillins.  Past Medical History Patient  has a past medical history of Asthma and Depression.  Surgical History Patient  has a past surgical history that includes Partial  hysterectomy (2015).  Family History Pateint's family history includes Alcohol abuse in her paternal grandfather; Arthritis in her father, maternal grandmother, and paternal grandmother; Asthma in her paternal grandmother; Cancer in her paternal grandmother; Diabetes in her maternal grandmother.  Social History Patient  reports that she has never smoked. She has never used smokeless tobacco. She reports current alcohol use. She reports that she does not use drugs.    Objective: Vitals:   09/11/18 0829  Weight: 192 lb (87.1 kg)  Height: 5\' 7"  (1.702 m)    Body mass index is 30.07 kg/m.  Physical Exam Vitals signs reviewed.  Constitutional:      Appearance: Normal appearance.  HENT:     Head: Normocephalic and atraumatic.  Pulmonary:     Effort: Pulmonary effort is normal.  Neurological:     General: No focal deficit present.     Mental Status: She is alert and oriented to person, place, and time.  Psychiatric:        Mood and Affect: Mood normal.        Behavior: Behavior normal.     Comments: No si/hi/ah/vh       [facility] 09/11/2018 08/11/2018  Decreased Interest 1 2  Down, Depressed, Hopeless 3 2  PHQ - 2 Score 4 4  Altered sleeping 3 3  Tired, decreased energy 3 3  Change in appetite 3 3  Feeling bad or failure about yourself  1 2  Trouble concentrating 3 3  Moving slowly or fidgety/restless 3 3  Suicidal thoughts 0 0  PHQ-9 Score 20 21  Difficult doing work/chores Extremely dIfficult Very difficult   GAD 7 : Generalized Anxiety Score 09/11/2018  Nervous, Anxious, on Edge 1  Control/stop worrying 1  Worry too much - different things 1  Trouble relaxing 1  Restless 1  Easily annoyed or irritable 3  Afraid - awful might happen 0  Total GAD 7 Score 8  Anxiety Difficulty Somewhat difficult        GAD 7 : Generalized Anxiety Score 09/11/2018  Nervous, Anxious, on Edge 1  Control/stop worrying 1  Worry too much - different things 1  Trouble  relaxing 1  Restless 1  Easily annoyed or irritable 3  Afraid - awful might happen 0  Total GAD 7 Score 8  Anxiety Difficulty Somewhat difficult      Assessment/plan: 1. Anxiety and depression phq9 score that is severe and anxiety score that is mild. Her complaints seem to be more anxiety driven, but her depression score is much more severe. She can no longer cope on her own and she agrees we need to start daily medication. We are going to start her on prozac 20mg  with hydroxyzine prn for increased anxiety. I've explained to her that drugs of the SSRI class can have side effects such as weight gain, sexual dysfunction, insomnia, headache, nausea. These medications are generally effective at alleviating symptoms of anxiety and/or depression. Let me know if significant side effects do occur. Also if any si/hishe is to call 911 or go to ER. Recommend she start exercising on regular basis and she is going to look into counseling. contact information was given for our in office counselor. F/u in one month or sooner if needed.         Return in about 1 month (around 10/11/2018) for depression/anxiety follow up. vitural visit on thursday please .     @AWME @ 09/11/2018

## 2018-09-18 ENCOUNTER — Encounter: Payer: Self-pay | Admitting: Family Medicine

## 2018-09-18 ENCOUNTER — Other Ambulatory Visit: Payer: Self-pay

## 2018-09-18 ENCOUNTER — Ambulatory Visit: Payer: 59

## 2018-09-18 ENCOUNTER — Ambulatory Visit (INDEPENDENT_AMBULATORY_CARE_PROVIDER_SITE_OTHER): Payer: 59

## 2018-09-18 DIAGNOSIS — Z23 Encounter for immunization: Secondary | ICD-10-CM | POA: Diagnosis not present

## 2018-09-23 ENCOUNTER — Encounter: Payer: Self-pay | Admitting: Family Medicine

## 2018-09-24 ENCOUNTER — Encounter: Payer: Self-pay | Admitting: Family Medicine

## 2018-09-25 ENCOUNTER — Other Ambulatory Visit: Payer: Self-pay | Admitting: Family Medicine

## 2018-09-25 MED ORDER — SERTRALINE HCL 100 MG PO TABS: ORAL_TABLET | ORAL | 1 refills | Status: DC

## 2018-09-25 MED ORDER — FLUCONAZOLE 150 MG PO TABS: 150.0000 mg | ORAL_TABLET | Freq: Once | ORAL | 0 refills | Status: AC

## 2018-12-17 ENCOUNTER — Encounter: Payer: Self-pay | Admitting: Family Medicine

## 2019-01-14 ENCOUNTER — Encounter: Payer: Self-pay | Admitting: Family Medicine

## 2019-02-23 ENCOUNTER — Ambulatory Visit: Payer: 59 | Admitting: Family Medicine

## 2019-03-10 ENCOUNTER — Ambulatory Visit: Payer: Managed Care, Other (non HMO) | Attending: Internal Medicine

## 2019-03-10 ENCOUNTER — Other Ambulatory Visit: Payer: Self-pay

## 2019-03-10 DIAGNOSIS — Z20822 Contact with and (suspected) exposure to covid-19: Secondary | ICD-10-CM

## 2019-03-11 LAB — NOVEL CORONAVIRUS, NAA: SARS-CoV-2, NAA: NOT DETECTED

## 2019-04-15 ENCOUNTER — Ambulatory Visit: Payer: Managed Care, Other (non HMO) | Admitting: Family Medicine

## 2019-04-15 ENCOUNTER — Encounter: Payer: Self-pay | Admitting: Family Medicine

## 2019-04-15 NOTE — Telephone Encounter (Signed)
FYI

## 2019-04-16 ENCOUNTER — Other Ambulatory Visit (HOSPITAL_COMMUNITY)
Admission: RE | Admit: 2019-04-16 | Discharge: 2019-04-16 | Disposition: A | Discharge status: Home / Self Care | Payer: Managed Care, Other (non HMO) | Source: Ambulatory Visit | Attending: Family Medicine | Admitting: Family Medicine

## 2019-04-16 ENCOUNTER — Other Ambulatory Visit: Payer: Self-pay

## 2019-04-16 ENCOUNTER — Ambulatory Visit (INDEPENDENT_AMBULATORY_CARE_PROVIDER_SITE_OTHER): Payer: Managed Care, Other (non HMO) | Admitting: Family Medicine

## 2019-04-16 ENCOUNTER — Encounter: Payer: Self-pay | Admitting: Family Medicine

## 2019-04-16 VITALS — BP 102/80 | HR 77 | Temp 97.9°F | Ht 67.0 in | Wt 198.0 lb

## 2019-04-16 DIAGNOSIS — F39 Unspecified mood [affective] disorder: Secondary | ICD-10-CM | POA: Diagnosis not present

## 2019-04-16 DIAGNOSIS — K0889 Other specified disorders of teeth and supporting structures: Secondary | ICD-10-CM | POA: Diagnosis not present

## 2019-04-16 DIAGNOSIS — E559 Vitamin D deficiency, unspecified: Secondary | ICD-10-CM

## 2019-04-16 DIAGNOSIS — Z113 Encounter for screening for infections with a predominantly sexual mode of transmission: Secondary | ICD-10-CM | POA: Insufficient documentation

## 2019-04-16 DIAGNOSIS — F339 Major depressive disorder, recurrent, unspecified: Secondary | ICD-10-CM | POA: Diagnosis not present

## 2019-04-16 LAB — COMPREHENSIVE METABOLIC PANEL
ALT: 10 U/L (ref 0–35)
AST: 13 U/L (ref 0–37)
Albumin: 4.1 g/dL (ref 3.5–5.2)
Alkaline Phosphatase: 54 U/L (ref 39–117)
BUN: 10 mg/dL (ref 6–23)
CO2: 30 mEq/L (ref 19–32)
Calcium: 9.3 mg/dL (ref 8.4–10.5)
Chloride: 102 mEq/L (ref 96–112)
Creatinine, Ser: 1.12 mg/dL (ref 0.40–1.20)
GFR: 67.08 mL/min (ref >60.00)
Glucose, Bld: 89 mg/dL (ref 70–99)
Potassium: 3.9 mEq/L (ref 3.5–5.1)
Sodium: 137 mEq/L (ref 135–145)
Total Bilirubin: 0.4 mg/dL (ref 0.2–1.2)
Total Protein: 7.2 g/dL (ref 6.0–8.3)

## 2019-04-16 LAB — CBC WITH DIFFERENTIAL/PLATELET
Basophils Absolute: 0.1 10*3/uL (ref 0.0–0.1)
Basophils Relative: 0.6 % (ref 0.0–3.0)
Eosinophils Absolute: 0.1 10*3/uL (ref 0.0–0.7)
Eosinophils Relative: 0.8 % (ref 0.0–5.0)
HCT: 39.1 % (ref 36.0–46.0)
Hemoglobin: 13.1 g/dL (ref 12.0–15.0)
Lymphocytes Relative: 25.8 % (ref 12.0–46.0)
Lymphs Abs: 2.5 10*3/uL (ref 0.7–4.0)
MCHC: 33.6 g/dL (ref 30.0–36.0)
MCV: 92.3 fl (ref 78.0–100.0)
Monocytes Absolute: 0.5 10*3/uL (ref 0.1–1.0)
Monocytes Relative: 5.3 % (ref 3.0–12.0)
Neutro Abs: 6.7 10*3/uL (ref 1.4–7.7)
Neutrophils Relative %: 67.5 % (ref 43.0–77.0)
Platelets: 305 10*3/uL (ref 150.0–400.0)
RBC: 4.23 Mil/uL (ref 3.87–5.11)
RDW: 13.4 % (ref 11.5–15.5)
WBC: 9.9 10*3/uL (ref 4.0–10.5)

## 2019-04-16 LAB — VITAMIN D 25 HYDROXY (VIT D DEFICIENCY, FRACTURES): VITD: 24.53 ng/mL — ABNORMAL LOW (ref 30.00–100.00)

## 2019-04-16 LAB — TSH: TSH: 1.26 u[IU]/mL (ref 0.35–4.50)

## 2019-04-16 MED ORDER — ATOMOXETINE HCL 40 MG PO CAPS: 40.0000 mg | ORAL_CAPSULE | Freq: Every day | ORAL | 1 refills | Status: DC

## 2019-04-16 NOTE — Progress Notes (Signed)
Patient: Morgan Oneal MRN: [id number] DOB: 20-Apr-1985 PCP: Orma Flaming, MD     Subjective:  Chief Complaint  Patient presents with  . Dental Pain  . Depression  . vitamin D deficiency    HPI: The patient is a 34 y.o. female who presents today for follow up of depression, tooth ache and vitamin D deficiency.   Depression: she was put on prozac last year and couldn't tolerate this so I changed her to zoloft. She is not taking this as prescribed. She states she doesn't like medication and feels like she can tolerate well without medication and doesn't like being on medication. Her main complaint is not focusing well.   Lack of attention: she states she was tested for ADHD in school in elementary school. She states her mother didn't want to do medication so she had an IEP with extended time. As an adult she did therapy with a psychologist and was tested. She states they told her ADHD with bipolar. She was only doing therapy and had started adderall. She states the dosage was too high. She states she has always had an issue with attention. +FH of mental health with bipolar/schizophrenia in two uncles. No depression or anxiety in mom or dad that is clinically diagnosed. Unsure of brother or sister.   Vitamin D deficiency: needs rechecked today.   Tooth ache: seeing dentist tomorrow.   Review of Systems  Constitutional: Positive for fatigue. Negative for chills and fever.  HENT: Positive for sore throat. Negative for dental problem, ear pain, hearing loss and trouble swallowing.        Due to tooth ache  Eyes: Negative for visual disturbance.  Respiratory: Negative for cough, chest tightness, shortness of breath and wheezing.   Cardiovascular: Negative for chest pain, palpitations and leg swelling.  Gastrointestinal: Negative for abdominal pain, blood in stool, diarrhea and nausea.  Endocrine: Negative for cold intolerance, polydipsia, polyphagia and polyuria.  Genitourinary:  Negative for dysuria and hematuria.  Musculoskeletal: Negative for arthralgias.  Skin: Negative for rash.  Neurological: Negative for dizziness, numbness and headaches.  Psychiatric/Behavioral: Positive for decreased concentration. Negative for dysphoric mood, sleep disturbance and suicidal ideas. The patient is not nervous/anxious.     Allergies Patient is allergic to mucinex [guaifenesin er] and penicillins.  Past Medical History Patient  has a past medical history of Asthma and Depression.  Surgical History Patient  has a past surgical history that includes Partial hysterectomy (2015).  Family History Pateint's family history includes Alcohol abuse in her paternal grandfather; Arthritis in her father, maternal grandmother, and paternal grandmother; Asthma in her paternal grandmother; Cancer in her paternal grandmother; Diabetes in her maternal grandmother.  Social History Patient  reports that she has never smoked. She has never used smokeless tobacco. She reports current alcohol use. She reports that she does not use drugs.    Objective: Vitals:   04/16/19 1022  BP: 102/80  Pulse: 77  Temp: 97.9 F (36.6 C)  TempSrc: Temporal  SpO2: 98%  Weight: 198 lb (89.8 kg)  Height: 5\' 7"  (1.702 m)    Body mass index is 31.01 kg/m.  Physical Exam Vitals reviewed.  Constitutional:      Appearance: Normal appearance. She is well-developed. She is obese.  HENT:     Head: Normocephalic and atraumatic.     Right Ear: Tympanic membrane, ear canal and external ear normal.     Left Ear: Tympanic membrane, ear canal and external ear normal.     Mouth/Throat:  Mouth: Mucous membranes are moist.  Eyes:     Extraocular Movements: Extraocular movements intact.     Conjunctiva/sclera: Conjunctivae normal.     Pupils: Pupils are equal, round, and reactive to light.  Neck:     Thyroid: No thyromegaly.  Cardiovascular:     Rate and Rhythm: Normal rate and regular rhythm.     Heart  sounds: Normal heart sounds. No murmur.  Pulmonary:     Effort: Pulmonary effort is normal.     Breath sounds: Normal breath sounds.  Abdominal:     General: Bowel sounds are normal. There is no distension.     Palpations: Abdomen is soft.     Tenderness: There is no abdominal tenderness.  Musculoskeletal:     Cervical back: Normal range of motion and neck supple.  Lymphadenopathy:     Cervical: No cervical adenopathy.  Skin:    General: Skin is warm and dry.     Capillary Refill: Capillary refill takes less than 2 seconds.     Findings: No rash.  Neurological:     General: No focal deficit present.     Mental Status: She is alert and oriented to person, place, and time.     Cranial Nerves: No cranial nerve deficit.     Coordination: Coordination normal.     Deep Tendon Reflexes: Reflexes normal.     Comments: No si/hi/ah/vh   Psychiatric:        Mood and Affect: Mood normal.        Behavior: Behavior normal.      Office Visit from 04/16/2019 in [facility]  PHQ-9 Total Score  17          Assessment/plan: 1. Depression, recurrent (HCC) Her phq 9 score is moderately severe and continues to be elevated. We have tried her on prozac, she didn't tolerate and then changed her over to zoloft which she is not taking. Today is the first day she tells me she was diagnosed as bipolar in the past. ? Bipolar depression. Likely contributing to her not taking medication. Discussed I want her to see psychiatry for full exam and treatment if she does have this. She has no si/hi/ah/vh and is able to complete her job, school, etc fine.   2. Mood disorder [facility]) Referral to psychiatry done. Discussed she is multi-faceted as adhd, anxiety and bipolar all have similar symptoms. Getting her tested for ADHD, starting strattera and would like psychiatrist eval for bipolar.    3. Vitamin D deficiency Checking, on no daily medication.   4. Tooth ache Seeing dentist. Known  cracked tooth.  No signs of abscess/infection.     This visit occurred during the SARS-CoV-2 public health emergency.  Safety protocols were in place, including screening questions prior to the visit, additional usage of staff PPE, and extensive cleaning of exam room while observing appropriate contact time as indicated for disinfecting solutions.     Return in about 3 months (around 07/16/2019) for med follow up .   [name], MD [facility]   04/16/2019

## 2019-04-16 NOTE — Patient Instructions (Addendum)
1) referral for psychiatry for possible bipolar. Long wait.... 2) if you have ADHD you have to get tested. Handout given to get this done you have to call and set this up.   3) in the meantime we will try strattera. This is a non stimulant for adult ADHD. Will take this daily. Need to get tested. Hand out given.   4) labs today for STD screen/vitmain D and other labs for fatigue.   F/u in 3 months for medication.   Dr.Keyante Durio

## 2019-04-17 ENCOUNTER — Other Ambulatory Visit: Payer: Self-pay | Admitting: Family Medicine

## 2019-04-17 MED ORDER — VITAMIN D (ERGOCALCIFEROL) 1.25 MG (50000 UNIT) PO CAPS: ORAL_CAPSULE | ORAL | 0 refills | Status: DC

## 2019-04-22 ENCOUNTER — Other Ambulatory Visit: Payer: Self-pay | Admitting: Family Medicine

## 2019-04-22 LAB — URINE CYTOLOGY ANCILLARY ONLY
Bacterial Vaginitis-Urine: POSITIVE — AB
Bacterial Vaginitis-Urine: POSITIVE — AB
Bacterial Vaginitis-Urine: POSITIVE — AB
Bacterial Vaginitis-Urine: POSITIVE — AB
Candida Urine: NEGATIVE
Chlamydia: NEGATIVE
Comment: NEGATIVE
Comment: NEGATIVE
Comment: NORMAL
Neisseria Gonorrhea: NEGATIVE
Trichomonas: NEGATIVE

## 2019-04-22 MED ORDER — METRONIDAZOLE 500 MG PO TABS: 500.0000 mg | ORAL_TABLET | Freq: Two times a day (BID) | ORAL | 0 refills | Status: DC

## 2019-05-07 ENCOUNTER — Other Ambulatory Visit: Payer: Self-pay | Admitting: Family Medicine

## 2019-05-08 ENCOUNTER — Other Ambulatory Visit: Payer: Self-pay | Admitting: Family Medicine

## 2019-05-19 ENCOUNTER — Encounter: Payer: Self-pay | Admitting: Family Medicine

## 2019-05-22 ENCOUNTER — Other Ambulatory Visit: Payer: Self-pay | Admitting: Family Medicine

## 2019-06-11 ENCOUNTER — Encounter: Payer: Self-pay | Admitting: Family Medicine

## 2019-07-09 ENCOUNTER — Other Ambulatory Visit: Payer: Self-pay | Admitting: Family Medicine

## 2019-07-13 ENCOUNTER — Other Ambulatory Visit: Payer: Self-pay | Admitting: Family Medicine

## 2019-07-15 ENCOUNTER — Encounter: Payer: Self-pay | Admitting: Family Medicine

## 2019-07-15 ENCOUNTER — Telehealth (INDEPENDENT_AMBULATORY_CARE_PROVIDER_SITE_OTHER): Payer: Managed Care, Other (non HMO) | Admitting: Family Medicine

## 2019-07-15 VITALS — Ht 67.0 in | Wt 202.8 lb

## 2019-07-15 DIAGNOSIS — F339 Major depressive disorder, recurrent, unspecified: Secondary | ICD-10-CM | POA: Diagnosis not present

## 2019-07-15 DIAGNOSIS — F39 Unspecified mood [affective] disorder: Secondary | ICD-10-CM

## 2019-07-15 MED ORDER — HYDROXYZINE HCL 25 MG PO TABS: 25.0000 mg | ORAL_TABLET | Freq: Three times a day (TID) | ORAL | 1 refills | Status: DC | PRN

## 2019-07-15 NOTE — Progress Notes (Signed)
Patient: Morgan Oneal MRN: [id number] DOB: 04-08-1985 PCP: Morgan Mustard, MD     I connected with Morgan Oneal on 07/15/19 at 1:35pm by a video enabled telemedicine application and verified that I am speaking with the correct person using two identifiers.  Location patient: Home Location provider: [facility], Office Persons participating in this virtual visit: Morgan Oneal and dr. Artis Oneal   I discussed the limitations of evaluation and management by telemedicine and the availability of in person appointments. The patient expressed understanding and agreed to proceed.   Subjective:  Chief Complaint  Patient presents with  . Depression    3 month f/u  . Medication Refill    Morgan Oneal is working.    HPI: The patient is a 34 y.o. female who presents today for Depression/mood disorder follow up. I started her on strattera to see if this would help with focusing and also her depression. he says that the medication is helping. Her depression is better, but she has a lot of family stuff going on that triggers it. (fighting for custody of her kids from her grandmother). She feels like she is doing well with this dosage. No headaches, GI upset. Has had some sleep issues, but feels like more related to family issues and not medication. She is able to get though her work day and get her stuff done. She feels like she is well focused and doesn't need dosage changed.   She was in school, but put this on hold as she has too much going on. She is still working.   Review of Systems  Respiratory: Negative for shortness of breath and wheezing.   Cardiovascular: Negative for chest pain and palpitations.  Gastrointestinal: Negative for abdominal pain, nausea and vomiting.  Neurological: Negative for dizziness, light-headedness and headaches.    Allergies Patient is allergic to mucinex [guaifenesin er] and penicillins.  Past Medical History Patient  has a past medical history of Asthma and  Depression.  Surgical History Patient  has a past surgical history that includes Partial hysterectomy (2015).  Family History Pateint's family history includes Alcohol abuse in her paternal grandfather; Arthritis in her father, maternal grandmother, and paternal grandmother; Asthma in her paternal grandmother; Cancer in her paternal grandmother; Diabetes in her maternal grandmother.  Social History Patient  reports that she has never smoked. She has never used smokeless tobacco. She reports current alcohol use. She reports that she does not use drugs.    Objective: Vitals:   07/15/19 1320  Weight: 202 lb 12.8 oz (92 kg)  Height: 5\' 7"  (1.702 m)    Body mass index is 31.76 kg/m.  Physical Exam Vitals reviewed.  Constitutional:      Appearance: Normal appearance. She is normal weight.  HENT:     Head: Normocephalic and atraumatic.  Pulmonary:     Effort: Pulmonary effort is normal.  Neurological:     General: No focal deficit present.     Mental Status: She is alert and oriented to person, place, and time.  Psychiatric:        Mood and Affect: Mood normal.        Behavior: Behavior normal.     Comments: No si/hi           Video Visit from 07/15/2019 in [facility]  PHQ-9 Total Score 10      Assessment/plan: 1. Depression, recurrent (HCC) phq9 score is significantly improved from 17 to 10. The strattera is helping her quite a bit,  but she is still struggling with everything going on with her family. She does feel supported. Will continue strattera. Did psychiatry eval and has upcoming appointment. Unsure if psychiatry vs. ADHD testing appointment.   2. Mood disorder (HCC) ADHD very well controlled on strattera. She is happy with dosing and does not want to change this. She has testing upcoming, unsure if for ADHD or for psychiatry. She has this next month and will let me know. Will need note for school if diagnosed with adhd. Doing well on  strattera though.     Return if symptoms worsen or fail to improve.     Morgan Mustard, MD  [facility]  07/15/2019

## 2019-07-31 ENCOUNTER — Other Ambulatory Visit: Payer: Self-pay

## 2019-07-31 ENCOUNTER — Telehealth (INDEPENDENT_AMBULATORY_CARE_PROVIDER_SITE_OTHER): Payer: 59 | Admitting: Psychiatry

## 2019-07-31 DIAGNOSIS — F902 Attention-deficit hyperactivity disorder, combined type: Secondary | ICD-10-CM | POA: Diagnosis not present

## 2019-07-31 DIAGNOSIS — F319 Bipolar disorder, unspecified: Secondary | ICD-10-CM | POA: Diagnosis not present

## 2019-07-31 MED ORDER — ARIPIPRAZOLE 5 MG PO TABS: ORAL_TABLET | ORAL | 1 refills | Status: DC
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
## 2019-08-02 ENCOUNTER — Encounter: Payer: Self-pay | Admitting: Family Medicine

## 2019-08-03 ENCOUNTER — Other Ambulatory Visit: Payer: Self-pay

## 2019-08-03 DIAGNOSIS — Z20822 Contact with and (suspected) exposure to covid-19: Secondary | ICD-10-CM

## 2019-08-04 LAB — SARS-COV-2, NAA 2 DAY TAT

## 2019-08-04 LAB — NOVEL CORONAVIRUS, NAA: SARS-CoV-2, NAA: NOT DETECTED

## 2019-08-05 ENCOUNTER — Encounter (HOSPITAL_COMMUNITY): Payer: Self-pay

## 2019-08-24 ENCOUNTER — Encounter (HOSPITAL_COMMUNITY): Payer: Self-pay | Admitting: Psychiatry

## 2019-08-24 ENCOUNTER — Telehealth (INDEPENDENT_AMBULATORY_CARE_PROVIDER_SITE_OTHER): Payer: 59 | Admitting: Psychiatry

## 2019-08-24 ENCOUNTER — Other Ambulatory Visit: Payer: Self-pay

## 2019-08-24 DIAGNOSIS — F902 Attention-deficit hyperactivity disorder, combined type: Secondary | ICD-10-CM

## 2019-08-24 DIAGNOSIS — F319 Bipolar disorder, unspecified: Secondary | ICD-10-CM | POA: Diagnosis not present

## 2019-08-24 MED ORDER — ARIPIPRAZOLE 5 MG PO TABS: ORAL_TABLET | ORAL | 0 refills | Status: DC

## 2019-08-24 MED ORDER — ATOMOXETINE HCL 40 MG PO CAPS: 40.0000 mg | ORAL_CAPSULE | Freq: Every day | ORAL | 0 refills | Status: DC

## 2019-08-24 NOTE — Progress Notes (Signed)
Virtual Visit via Video Note  I connected with [name] on 08/24/19 at 11:20 AM EDT by a video enabled telemedicine application and verified that I am speaking with the correct person using two identifiers.  Location: Patient: home Provider: home office   I discussed the limitations of evaluation and management by telemedicine and the availability of in person appointments. The patient expressed understanding and agreed to proceed.  History of Present Illness: Alandria is 34 year old African-American employed female who is evaluated by video session.  She was seen 4 weeks ago as referred from PCP for her psychiatric symptoms.  We started her on Abilify 5 mg to help her mood symptoms.  She noticed improvement in her mood, irritability, anger and highs and lows but continues to struggle with attention, focus and multitasking.  She feels Abilify helping her energy level and she is not as cranky and angry but need Strattera back so she can finish her schoolwork and work on time.  She struggle with attention and sometimes she feels own out at work.  She is trying to finish her school.  She is studying at [employer] and is studying psychology and nursing.  She also working full-time at [employer] and raising 3 kids.  She also had a custody battle with her grandmother.  She is sleeping better.  She rarely takes trazodone and hydroxyzine but does help when she takes for anxiety and sleep.  She has no more passive suicidal thoughts.  She still ruminates sometimes.  We have recommended psychological testing to establish diagnosis of ADHD but she has not able to schedule appointment.  So far she is tolerating Abilify without any tremors, shakes or any EPS.  She denies any paranoia, hallucination or any active suicidal thoughts.  She goes to gym when she gets time.  Her appetite is okay.  Past Psychiatric History: H/O ADHD and given Adderall but no details. H/O cutting herself when parents going  through divorce.  No h/o inpatient, suicidal attempt, psychosis, mood swings, highs and lows, anger, irritability.  H/O hevay ETOH.   Psychiatric Specialty Exam: Physical Exam  Review of Systems  There were no vitals taken for this visit.There is no height or weight on file to calculate BMI.  General Appearance: Casual  Eye Contact:  Good  Speech:  Clear and Coherent  Volume:  Normal  Mood:  emotional  Affect:  Congruent  Thought Process:  Descriptions of Associations: Intact  Orientation:  Full (Time, Place, and Person)  Thought Content:  Rumination  Suicidal Thoughts:  No  Homicidal Thoughts:  No  Memory:  Immediate;   Good Recent;   Fair Remote;   Fair  Judgement:  Intact  Insight:  Present  Psychomotor Activity:  Normal  Concentration:  Concentration: Fair and Attention Span: Fair  Recall:  [employer] of Knowledge:  Good  Language:  Good  Akathisia:  No  Handed:  Right  AIMS (if indicated):     Assets:  Communication Skills Desire for Improvement Housing Resilience Social Support Talents/Skills Transportation  ADL's:  Intact  Cognition:  WNL  Sleep:   ok      Assessment and Plan: Bipolar disorder type I.  ADHD, combined type.  Patient doing better with Abilify but also like to resume her Strattera which had helped her focus attention and multitasking.  We will resume Strattera 40 mg daily and continue Abilify 5 mg daily.  I also recommend she should take only hydroxyzine when she is very  anxious and unable to sleep.  Recommend to discontinue trazodone since taking multiple medication because dry mouth, EPS.  Encouraged to continue exercise and going to gym.  We will try to schedule her for psychological testing.  Recommended to call us back if she has any question or any concern.  Follow-up in 2 months.  Follow Up Instructions:    I discussed the assessment and treatment plan with the patient. The patient was provided an opportunity to ask questions and all  were answered. The patient agreed with the plan and demonstrated an understanding of the instructions.   The patient was advised to call back or seek an in-person evaluation if the symptoms worsen or if the condition fails to improve as anticipated.  I provided 20 minutes of non-face-to-face time during this encounter.   Cleotis Nipper, MD

## 2019-10-06 ENCOUNTER — Encounter: Payer: Self-pay | Admitting: Family Medicine

## 2019-10-13 ENCOUNTER — Ambulatory Visit: Payer: Medicaid Other

## 2019-10-19 ENCOUNTER — Telehealth (INDEPENDENT_AMBULATORY_CARE_PROVIDER_SITE_OTHER): Payer: 59 | Admitting: Psychiatry

## 2019-10-19 ENCOUNTER — Other Ambulatory Visit: Payer: Self-pay

## 2019-10-19 ENCOUNTER — Encounter (HOSPITAL_COMMUNITY): Payer: Self-pay | Admitting: Psychiatry

## 2019-10-19 VITALS — Wt 210.0 lb

## 2019-10-19 DIAGNOSIS — F319 Bipolar disorder, unspecified: Secondary | ICD-10-CM | POA: Diagnosis not present

## 2019-10-19 DIAGNOSIS — F902 Attention-deficit hyperactivity disorder, combined type: Secondary | ICD-10-CM

## 2019-10-19 MED ORDER — ATOMOXETINE HCL 60 MG PO CAPS: 60.0000 mg | ORAL_CAPSULE | Freq: Every day | ORAL | 0 refills | Status: DC

## 2019-10-19 MED ORDER — TRAZODONE HCL 50 MG PO TABS: 50.0000 mg | ORAL_TABLET | Freq: Every evening | ORAL | 0 refills | Status: DC | PRN

## 2019-10-19 MED ORDER — ARIPIPRAZOLE 5 MG PO TABS: ORAL_TABLET | ORAL | 0 refills | Status: DC

## 2019-10-19 NOTE — Progress Notes (Signed)
Virtual Visit via Telephone Note  I connected with Morgan Oneal on 10/19/19 at 11:00 AM EDT by telephone and verified that I am speaking with the correct person using two identifiers.  Location: Patient: home Provider: home office   I discussed the limitations, risks, security and privacy concerns of performing an evaluation and management service by telephone and the availability of in person appointments. I also discussed with the patient that there may be a patient responsible charge related to this service. The patient expressed understanding and agreed to proceed.   History of Present Illness: Patient is evaluated by phone session.  She has unable to login for review of session.  She is taking Strattera which was resumed on the last visit.  She noticed improvement in her attention concentration and multitasking but she still feels some time overwhelmed and forget things.  She is working at [employer] and there are times that she has to take mental health days because she was overwhelmed.  She is compliant with Abilify.  She has residual mood swings irritability but overall she feels her mood swing is not as bad.  She is sleeping at least 8 hours and sometimes she takes trazodone and hydroxyzine.  She is raising 3 kids and also studying at [employer].  She is studying psychology and nursing.  She reported her grades are okay and so far no major concern.  She has no tremors shakes or any EPS.  From last week she started going to gym regularly when she noticed her weight is slightly increased.  She has no more suicidal thoughts or homicidal thought.  Her ADHD is somewhat better but is still struggling with hyperactivity and attention.  She has no tremors.  She has not heard the vertigo off custody battle with her grandmother.  She is hoping that she will receive a letter in the day.  Patient mention her PCP is now out for her sickness and she has not had blood work in a while.  She is  requesting FMLA because some days she has so much stress that she is taking mental health days.  She denies drinking or using any illegal substances.  She has not schedule appointment for psychological testing yet.  Past Psychiatric History: H/O ADHD and given Adderall but no details. H/O cutting herself when parents going through divorce. No h/o inpatient, suicidal attempt, psychosis, mood swings, highs and lows, anger, irritability. H/O hevay ETOH.    Psychiatric Specialty Exam: Physical Exam  Review of Systems  Weight 210 lb (95.3 kg).There is no height or weight on file to calculate BMI.  General Appearance: NA  Eye Contact:  NA  Speech:  Clear and Coherent  Volume:  Normal  Mood:  Anxious  Affect:  NA  Thought Process:  Descriptions of Associations: Intact  Orientation:  Full (Time, Place, and Person)  Thought Content:  Rumination  Suicidal Thoughts:  No  Homicidal Thoughts:  No  Memory:  Immediate;   Good Recent;   Good Remote;   Fair  Judgement:  Intact  Insight:  Shallow  Psychomotor Activity:  NA  Concentration:  Concentration: Fair and Attention Span: Fair  Recall:  [employer] of Knowledge:  Good  Language:  Good  Akathisia:  No  Handed:  Right  AIMS (if indicated):     Assets:  Communication Skills Desire for Improvement Housing Resilience Talents/Skills Transportation  ADL's:  Intact  Cognition:  WNL  Sleep:   8 hrs  Assessment and Plan: Bipolar disorder type I.  ADHD, combined type.  Patient mood symptoms are better with Abilify however she is still struggling with attention focus and hyperactivity.  Recommend to try Strattera 60 mg daily and we will continue Abilify 5 mg daily.  Patient like to have FMLA so she can take days off for her mental health issues when she feels overwhelmed.  I agree with the decision.  I recommend to have her paperwork drop at our office.  Patient also like to have a refill of the trazodone since her PCP has been sick and  out.  We will provide trazodone 50 mg to take as needed for severe insomnia.  Discussed medication side effects and benefits.  Recommended to call us back if she is any question or any concern.  Follow-up in 3 months.  Follow Up Instructions:    I discussed the assessment and treatment plan with the patient. The patient was provided an opportunity to ask questions and all were answered. The patient agreed with the plan and demonstrated an understanding of the instructions.   The patient was advised to call back or seek an in-person evaluation if the symptoms worsen or if the condition fails to improve as anticipated.  I provided 20 minutes of non-face-to-face time during this encounter.   Cleotis Nipper, MD

## 2019-11-18 ENCOUNTER — Other Ambulatory Visit (HOSPITAL_COMMUNITY): Payer: Self-pay | Admitting: Psychiatry

## 2019-11-18 DIAGNOSIS — F319 Bipolar disorder, unspecified: Secondary | ICD-10-CM

## 2019-12-14 ENCOUNTER — Other Ambulatory Visit: Payer: Self-pay | Admitting: Family Medicine

## 2019-12-24 ENCOUNTER — Encounter: Payer: Self-pay | Admitting: Family Medicine

## 2019-12-25 ENCOUNTER — Telehealth: Payer: Managed Care, Other (non HMO) | Admitting: Nurse Practitioner

## 2019-12-25 DIAGNOSIS — U071 COVID-19: Secondary | ICD-10-CM | POA: Diagnosis not present

## 2019-12-25 NOTE — Progress Notes (Signed)
E-Visit for Corona Virus Screening  We are sorry you are not feeling well. We are here to help!  You have tested positive for COVID-19, meaning that you were infected with the novel coronavirus and could give the virus to others.  It is vitally important that you stay home so you do not spread it to others.      I will send in t=your name to the infusion center.   Please continue isolation at home, for at least 10 days since the start of your symptoms and until you have had 24 hours with no fever (without taking a fever reducer) and with improving of symptoms.  Most cases improve 10 days from onset but we have seen a small number of patients who have gotten worse after the 10 days.  Please be sure to watch for worsening symptoms and remain taking the proper precautions.   Go to the nearest hospital ED for assessment if fever/cough/breathlessness are severe or illness seems like a threat to life.    The following symptoms may appear 2-14 days after exposure: . Fever . Cough . Shortness of breath or difficulty breathing . Chills . Repeated shaking with chills . Muscle pain . Headache . Sore throat . New loss of taste or smell . Fatigue . Congestion or runny nose . Nausea or vomiting . Diarrhea  You have been enrolled in [facility] Monitoring for COVID-19. Daily you will receive a questionnaire within the MyChart website. Our COVID-19 response team will be monitoring your responses daily.   We have asked the monoclonal antibody team contact you to discuss the infusion with you and potentially set this up. You should hear from them in the next 48 hours.    You may also take acetaminophen (Tylenol) as needed for fever.  HOME CARE: . Only take medications as instructed by your medical team. . Drink plenty of fluids and get plenty of rest. . A steam or ultrasonic humidifier can help if you have congestion.   GET HELP RIGHT AWAY IF YOU HAVE EMERGENCY WARNING SIGNS.  Call 911 or  proceed to your closest emergency facility if: . You develop worsening high fever. . Trouble breathing . Bluish lips or face . Persistent pain or pressure in the chest . New confusion . Inability to wake or stay awake . You cough up blood. . Your symptoms become more severe . Inability to hold down food or fluids  This list is not all possible symptoms. Contact your medical provider for any symptoms that are severe or concerning to you.    Your e-visit answers were reviewed by a board certified advanced clinical practitioner to complete your personal care plan.  Depending on the condition, your plan could have included both over the counter or prescription medications.  If there is a problem please reply once you have received a response from your provider.  Your safety is important to [place].  If you have drug allergies check your prescription carefully.    You can use MyChart to ask questions about today's visit, request a non-urgent call back, or ask for a work or school excuse for 24 hours related to this e-Visit. If it has been greater than 24 hours you will need to follow up with your provider, or enter a new e-Visit to address those concerns. You will get an e-mail in the next two days asking about your experience.  I hope that your e-visit has been valuable and will speed your recovery. Thank  you for using e-visits.  5-10 minutes spent reviewing and documenting in chart.

## 2019-12-28 ENCOUNTER — Encounter: Payer: Self-pay | Admitting: Family Medicine

## 2019-12-28 ENCOUNTER — Other Ambulatory Visit: Payer: Self-pay

## 2019-12-28 ENCOUNTER — Telehealth (INDEPENDENT_AMBULATORY_CARE_PROVIDER_SITE_OTHER): Payer: Managed Care, Other (non HMO) | Admitting: Family Medicine

## 2019-12-28 VITALS — Ht 67.0 in | Wt 210.1 lb

## 2019-12-28 DIAGNOSIS — U071 COVID-19: Secondary | ICD-10-CM | POA: Diagnosis not present

## 2019-12-28 NOTE — Patient Instructions (Signed)
I want you to start these vitamins.Marland Kitchen  1) vitamin D3: 5000IU/day 2) zinc 100mg /day 3) vitamin C 1000mg  three times a day 4) melatonin 10mg /night (may make you drowsy) 5) quercetin 500mg  twice a day  6) honey is great for cough eat off spoon daily 7) tumeric 500mg  twice a day   If you can get a pulse ox and make sure oxygen stays above 92-93%.  Get out side and deep breath Mouth wash three times a day   Keep me in the loop if not continuing to get better over next day or two.   Have to stay home for 10 days, fever free x 1 day with no medication  and improving symptoms.

## 2019-12-28 NOTE — Progress Notes (Signed)
Patient: Morgan Oneal MRN: [id number] DOB: [birth year] PCP: [name], MD     I connected with [name] on 12/28/19 at 1:10pm by a video enabled telemedicine application and verified that I am speaking with the correct person using two identifiers.  Location patient: Home Location provider: [facility], Office Persons participating in this virtual visit: Kennisha Qin and Dr. Artis Flock  I discussed the limitations of evaluation and management by telemedicine and the availability of in person appointments. The patient expressed understanding and agreed to proceed.   Interactive audio and video telecommunications were attempted between this provider and patient, however failed, due to patient having technical difficulties OR patient did not have access to video capability.  We continued and completed visit with audio only.   Subjective:  Chief Complaint  Patient presents with  . Covid Positive    Symptoms started on last Monday.    HPI: The patient is a 34 y.o. female who presents today for Covid Positive Result/ Symptoms started on last Monday with a sore and scratchy throat and then chills/fevers. On Tuesday her sore throat got worse and fever. On Wednesday she had the worse headache she has ever had with pressure behind her eyes. She had body aches and she was tired. Symptoms seemed to get worse through Thursday. Dry cough started on Wednesday. She tested positive for Covid on Thursday. Over the weekend she got worse and today she feels much better. No fever today. She thinks she may have had a fever yesterday. Her cough is still there. She is not short of breath and she has no wheezing. She has been taking zinc, elderberry, immune health capsules, airborne, delsyn.   She is fully vaccinated. First shot in august and second in September. Pfizer series.    Review of Systems  Constitutional: Positive for fatigue. Negative for fever.  HENT: Positive for congestion. Negative  for sinus pressure, sinus pain and sore throat.   Respiratory: Positive for cough. Negative for chest tightness and shortness of breath.   Gastrointestinal: Negative for abdominal pain and nausea.  Neurological: Positive for headaches. Negative for dizziness.    Allergies Patient is allergic to mucinex [guaifenesin er] and penicillins.  Past Medical History Patient  has a past medical history of Asthma and Depression.  Surgical History Patient  has a past surgical history that includes Partial hysterectomy (2015).  Family History Pateint's family history includes Alcohol abuse in her paternal grandfather; Arthritis in her father, maternal grandmother, and paternal grandmother; Asthma in her paternal grandmother; Cancer in her paternal grandmother; Diabetes in her maternal grandmother.  Social History Patient  reports that she has never smoked. She has never used smokeless tobacco. She reports current alcohol use. She reports that she does not use drugs.    Objective: Vitals:   12/28/19 1300  Weight: 210 lb 1.6 oz (95.3 kg)  Height: 5\' 7"  (1.702 m)    Body mass index is 32.91 kg/m.  Physical Exam Vitals reviewed.  Pulmonary:     Effort: Pulmonary effort is normal.     Comments: Talking in full complete sentences, no increased work of breathing.        Assessment/plan: 1. COVID-19 -she is day 8 of illness and improving. Likely will continue to improve -discussed nutraceutical bundle and vitamins all written down for her.  -mouthwash TID -outside/deep breathing -prone sleeping -recommended pulse ox and to make sure >92-93%.  -if symptoms do not continue to improve or worsen she is to let me  know.  -discussed quarantine guidelines.  -ER precautions given.       Return if symptoms worsen or fail to improve.     [name], MD [facility]  12/28/2019

## 2019-12-29 ENCOUNTER — Telehealth: Payer: Medicaid Other | Admitting: Family Medicine

## 2020-01-08 ENCOUNTER — Encounter: Payer: Self-pay | Admitting: Family Medicine

## 2020-01-08 MED ORDER — BUDESONIDE 0.5 MG/2ML IN SUSP: 0.5000 mg | Freq: Two times a day (BID) | RESPIRATORY_TRACT | 0 refills | Status: DC | PRN

## 2020-01-08 NOTE — Telephone Encounter (Signed)
Please Advise

## 2020-01-15 ENCOUNTER — Other Ambulatory Visit: Payer: Self-pay | Admitting: Family Medicine

## 2020-01-19 ENCOUNTER — Telehealth (INDEPENDENT_AMBULATORY_CARE_PROVIDER_SITE_OTHER): Payer: 59 | Admitting: Psychiatry

## 2020-01-19 ENCOUNTER — Other Ambulatory Visit: Payer: Self-pay

## 2020-01-19 ENCOUNTER — Encounter (HOSPITAL_COMMUNITY): Payer: Self-pay | Admitting: Psychiatry

## 2020-01-19 DIAGNOSIS — F902 Attention-deficit hyperactivity disorder, combined type: Secondary | ICD-10-CM

## 2020-01-19 DIAGNOSIS — F319 Bipolar disorder, unspecified: Secondary | ICD-10-CM

## 2020-01-19 MED ORDER — ATOMOXETINE HCL 60 MG PO CAPS: 60.0000 mg | ORAL_CAPSULE | Freq: Every day | ORAL | 0 refills | Status: DC

## 2020-01-19 MED ORDER — TRAZODONE HCL 50 MG PO TABS: 50.0000 mg | ORAL_TABLET | Freq: Every evening | ORAL | 0 refills | Status: DC | PRN

## 2020-01-19 MED ORDER — ARIPIPRAZOLE 5 MG PO TABS: ORAL_TABLET | ORAL | 0 refills | Status: DC

## 2020-01-19 NOTE — Progress Notes (Signed)
Virtual Visit via Telephone Note  I connected with Morgan Oneal on 01/19/20 at 10:40 AM EST by telephone and verified that I am speaking with the correct person using two identifiers.  Location: Patient: Home Provider: Home Office   I discussed the limitations, risks, security and privacy concerns of performing an evaluation and management service by telephone and the availability of in person appointments. I also discussed with the patient that there may be a patient responsible charge related to this service. The patient expressed understanding and agreed to proceed.   History of Present Illness: Patient is evaluated by phone session.  She had difficulty enjoying for video session.  She is on the phone by herself.  She reported that she had COVID around Christmas and she is recovering but is still having lingering cough.  She is taking inhaler.  Overall she described her mood is much better.  She had decided to drop the semester because it was too overwhelming and she was not able to keep up.  Her job is stressful and very busy.  She is working at [employer].  She forgot to drop the paperwork for [employer] but promised that she will do weight in 3 days.  She lives with her mother who is very supportive.  She has 3 children who are 15, 1 and 67 years old.  Patient feels the current medicine is working as she described her mood is stable and she denies any mania, psychosis, impulsivity or any agitation.  On the last visit we increased Strattera 60 mg and that helped her focus and attention.  She is sleeping okay.  She rarely takes trazodone usually over the weekends.  She has no tremors shakes or any EPS.  She described her appetite is okay and her weight is unchanged from the past.  She must continue current medication.   Past Psychiatric History: H/OADHDand givenAdderall but no details.H/Ocutting herself when parents going through divorce. Noh/oinpatient, suicidal attempt,psychosis,mood  swings, highs and lows, anger, irritability.H/O hevay ETOH.  Psychiatric Specialty Exam: Physical Exam  Review of Systems  Weight 212 lb (96.2 kg).There is no height or weight on file to calculate BMI.  General Appearance: NA  Eye Contact:  NA  Speech:  Clear and Coherent  Volume:  Normal  Mood:  Euthymic  Affect:  NA  Thought Process:  Goal Directed  Orientation:  Full (Time, Place, and Person)  Thought Content:  Logical  Suicidal Thoughts:  No  Homicidal Thoughts:  No  Memory:  Immediate;   Good Recent;   Good Remote;   Fair  Judgement:  Intact  Insight:  Present  Psychomotor Activity:  Negative  Concentration:  Concentration: Fair and Attention Span: Fair  Recall:  Good  Fund of Knowledge:  Good  Language:  Good  Akathisia:  No  Handed:  Right  AIMS (if indicated):     Assets:  Communication Skills Desire for Improvement Housing Resilience Social Support Talents/Skills Transportation  ADL's:  Intact  Cognition:  WNL  Sleep:   ok     Assessment and Plan: Bipolar disorder type I.  ADHD, combined type.  Patient doing better on her current medication.  She decided to stop the semester for now as she was feeling overwhelmed.  She promised that she will drop the [facility] paperwork.  I will continue Abilify 5 mg daily and Strattera 60 mg daily.  She also takes trazodone 50 mg as needed for insomnia.  Recommended to call us back if she has  any question or any concern.  Follow-up in 3 months.  Follow Up Instructions:    I discussed the assessment and treatment plan with the patient. The patient was provided an opportunity to ask questions and all were answered. The patient agreed with the plan and demonstrated an understanding of the instructions.   The patient was advised to call back or seek an in-person evaluation if the symptoms worsen or if the condition fails to improve as anticipated.  I provided 14 minutes of non-face-to-face time during this  encounter.   Cleotis Nipper, MD

## 2020-01-26 ENCOUNTER — Other Ambulatory Visit: Payer: Self-pay

## 2020-01-26 ENCOUNTER — Ambulatory Visit (INDEPENDENT_AMBULATORY_CARE_PROVIDER_SITE_OTHER): Payer: Managed Care, Other (non HMO)

## 2020-01-26 DIAGNOSIS — Z111 Encounter for screening for respiratory tuberculosis: Secondary | ICD-10-CM

## 2020-01-28 ENCOUNTER — Ambulatory Visit: Payer: Managed Care, Other (non HMO)

## 2020-01-28 LAB — TB SKIN TEST
Induration: 0 mm
TB Skin Test: NEGATIVE

## 2020-03-08 ENCOUNTER — Other Ambulatory Visit: Payer: Self-pay | Admitting: Family Medicine

## 2020-03-12 ENCOUNTER — Telehealth: Payer: Managed Care, Other (non HMO) | Admitting: Physician Assistant

## 2020-03-12 DIAGNOSIS — R6883 Chills (without fever): Secondary | ICD-10-CM

## 2020-03-12 DIAGNOSIS — R110 Nausea: Secondary | ICD-10-CM

## 2020-03-12 DIAGNOSIS — Z20822 Contact with and (suspected) exposure to covid-19: Secondary | ICD-10-CM

## 2020-03-12 DIAGNOSIS — R197 Diarrhea, unspecified: Secondary | ICD-10-CM

## 2020-03-12 MED ORDER — AZITHROMYCIN 250 MG PO TABS: 500.0000 mg | ORAL_TABLET | Freq: Every day | ORAL | 0 refills | Status: AC

## 2020-03-12 MED ORDER — ONDANSETRON HCL 4 MG PO TABS: 4.0000 mg | ORAL_TABLET | Freq: Three times a day (TID) | ORAL | 0 refills | Status: DC | PRN

## 2020-03-12 NOTE — Progress Notes (Addendum)
Virtual Visit via Video Note  I connected with Morgan Oneal on 03/12/20 at  10:40 AM EST by a video enabled telemedicine application and verified that I am speaking with the correct person using two identifiers.  Location: Patient: home Provider: provider's office Person participating in the virtual visit: patient and provider    I discussed the limitations of evaluation and management by telemedicine and the availability of in person appointments. The patient expressed understanding and agreed to proceed.     I discussed the assessment and treatment plan with the patient. The patient was provided an opportunity to ask questions and all were answered. The patient agreed with the plan and demonstrated an understanding of the instructions.   The patient was advised to call back or seek an in-person evaluation if the symptoms worsen or if the condition fails to improve as anticipated.     Morgan Lapping, PA-C  Ms. ferol, laiche are scheduled for a virtual visit with your provider today.    Just as we do with appointments in the office, we must obtain your consent to participate.  Your consent will be active for this visit and any virtual visit you may have with one of our providers in the next 365 days.    If you have a MyChart account, I can also send a copy of this consent to you electronically.  All virtual visits are billed to your insurance company just like a traditional visit in the office.  As this is a virtual visit, video technology does not allow for your provider to perform a traditional examination.  This may limit your provider's ability to fully assess your condition.  If your provider identifies any concerns that need to be evaluated in person or the need to arrange testing such as labs, EKG, etc, we will make arrangements to do so.    Although advances in technology are sophisticated, we cannot ensure that it will always work on either your end or our end.  If the connection  with a video visit is poor, we may have to switch to a telephone visit.  With either a video or telephone visit, we are not always able to ensure that we have a secure connection.   I need to obtain your verbal consent now.   Are you willing to proceed with your visit today?   Morgan Oneal has provided verbal consent on 03/12/2020 for a virtual visit (video or telephone).   Morgan Lapping, PA-C 03/12/2020  10:40 AM   Acute Office Visit  Subjective:    Patient ID: Morgan Oneal, female    DOB: 1985-05-15, 35 y.o.   MRN: [id number]  No chief complaint on file.   Patient is in today for nausea, diarrhea, chills and stomach cramps for the past 5 days. statse ate out at restaurant night before, but unsure if that was cause of sxs.  Has had approx 7 episodes of diarrhea in the past 24 hours- without blood or mucus. BMs had started to thicken yesterday, but today, had recurrent diarrhea. Chills are mild and are on and off. Has not taken any meds. Has been able to drink fluids and eats well. The abd pain is crampy, diffuse of the stomach, and occurs only before the BMs and is relived by defecation. Denies any localization of abd pain. Denies any recent travel/starting new meds, or having any known food allergens. Denies any fever, chills, cp, dyspnea, vomiting, constipation, bloating, increased gassiness, back pain, dysuria,  urgency, frequency, vag discharge, skin rash. Denies any URI sxs or any other sxs related to covid 19. Denies any known exposure to someone with Covid 19 or any other illness. States has at home covid tests and will complete one.   Abdominal Pain The pain is mild. The quality of the pain is cramping. Associated symptoms include diarrhea and nausea. Pertinent negatives include no anorexia, arthralgias, belching, constipation, dysuria, fever, flatus, frequency, headaches, hematochezia, hematuria, melena, myalgias, vomiting or weight loss. Nothing aggravates the pain. The pain is  relieved by bowel movements. She has tried nothing for the symptoms. There is no history of abdominal surgery, GERD, irritable bowel syndrome or ulcerative colitis.   Patient is in today for nausea, diarrhea, and stomach cramps for the past 5 days. statse ate out at restaurant night before, but unsure if that was cause of sxs.  Has had approx 7 episodes of diarrhea in the past 24 hours- without blood or mucus. BMs had started to thicken yesterday, but today, had recurrent diarrhea. Has not taken any meds. Has been able to drink fluids and eats well. The abd pain is crampy, diffuse of the stomach, and occurs only before the BMs and is relived by defecation. Denies any localization of abd pain. Denies any recent travel/starting new meds, or having any known food allergens. Denies any fever, chills, cp, dyspnea, vomiting, constipation, bloating, increased gassiness, back pain, dysuria, urgency, frequency, vag discharge, skin rash. Denies any URI sxs or any other sxs related to covid 19. Denies any known exposure to someone with Covid 19 or any other illness.    Past Medical History:  Diagnosis Date  . Asthma   . Depression     Past Surgical History:  Procedure Laterality Date  . PARTIAL HYSTERECTOMY  2015    Family History  Problem Relation Age of Onset  . Arthritis Father   . Arthritis Maternal Grandmother   . Diabetes Maternal Grandmother   . Arthritis Paternal Grandmother   . Asthma Paternal Grandmother   . Cancer Paternal Grandmother   . Alcohol abuse Paternal Grandfather     Social History   Socioeconomic History  . Marital status: Legally Separated    Spouse name: Not on file  . Number of children: Not on file  . Years of education: Not on file  . Highest education level: Not on file  Occupational History  . Not on file  Tobacco Use  . Smoking status: Never Smoker  . Smokeless tobacco: Never Used  Substance and Sexual Activity  . Alcohol use: Yes    Comment: maybe  once/week   . Drug use: Never  . Sexual activity: Yes    Partners: Male    Birth control/protection: Surgical  Other Topics Concern  . Not on file  Social History Narrative  . Not on file   Social Determinants of Health   Financial Resource Strain: Not on file  Food Insecurity: Not on file  Transportation Needs: Not on file  Physical Activity: Not on file  Stress: Not on file  Social Connections: Not on file  Intimate Partner Violence: Not on file    Outpatient Medications Prior to Visit  Medication Sig Dispense Refill  . acetaminophen (TYLENOL) 500 MG tablet Take 500 mg by mouth every 6 (six) hours as needed.    . ARIPiprazole (ABILIFY) 5 MG tablet Take one tab daily 90 tablet 0  . atomoxetine (STRATTERA) 60 MG capsule Take 1 capsule (60 mg total) by mouth daily. 90  capsule 0  . Biotin 1 MG CAPS Take by mouth.    . budesonide (PULMICORT) 0.5 MG/2ML nebulizer solution Take 2 mLs (0.5 mg total) by nebulization 2 (two) times daily as needed. 60 mL 0  . fluticasone (FLONASE) 50 MCG/ACT nasal spray Place 1 spray into both nostrils daily. 16 g 3  . hydrOXYzine (ATARAX/VISTARIL) 25 MG tablet Take 1 tablet (25 mg total) by mouth 3 (three) times daily as needed for anxiety. (Patient not taking: Reported on 01/19/2020) 60 tablet 1  . loratadine (CLARITIN) 10 MG tablet Take 1 tablet (10 mg total) by mouth daily. 90 tablet 1  . montelukast (SINGULAIR) 10 MG tablet Take 1 tablet (10 mg total) by mouth at bedtime. 90 tablet 1  . Multiple Vitamin (MULTIVITAMIN) tablet Take 1 tablet by mouth daily.    . pantoprazole (PROTONIX) 40 MG tablet Take 1 tablet (40 mg total) by mouth daily. 30 tablet 3  . traZODone (DESYREL) 50 MG tablet Take 1 tablet (50 mg total) by mouth at bedtime as needed. for sleep 30 tablet 0  . valACYclovir (VALTREX) 500 MG tablet Take 1 tablet (500 mg total) by mouth daily. 10 tablet 1  . Vitamin D, Ergocalciferol, (DRISDOL) 1.25 MG (50000 UNIT) CAPS capsule TAKE ONE CAPSULE BY  MOUTH ONCE A WEEK FOR 8 WEEKS. THEN TAKE 1000IU/DAY 12 capsule 1   No facility-administered medications prior to visit.    Allergies  Allergen Reactions  . Mucinex [Guaifenesin Er] Swelling  . Penicillins Anaphylaxis    Review of Systems  Constitutional: Positive for chills. Negative for activity change, appetite change, fever and weight loss.  HENT: Negative for congestion, rhinorrhea and sore throat.   Respiratory: Negative for cough and chest tightness.   Cardiovascular: Negative for chest pain, palpitations and leg swelling.  Gastrointestinal: Positive for abdominal pain, diarrhea and nausea. Negative for abdominal distention, anorexia, blood in stool, constipation, flatus, hematochezia, melena, rectal pain and vomiting.  Genitourinary: Negative for difficulty urinating, dysuria, frequency, hematuria, urgency and vaginal discharge.  Musculoskeletal: Negative for arthralgias and myalgias.  Skin: Negative for rash.  Neurological: Negative for dizziness, weakness, numbness and headaches.  Psychiatric/Behavioral: Negative for agitation.       Objective:    Physical Exam Vitals reviewed.  Constitutional:      General: She is not in acute distress.    Appearance: Normal appearance.  HENT:     Head: Normocephalic and atraumatic.  Eyes:     Conjunctiva/sclera: Conjunctivae normal.  Pulmonary:     Effort: Pulmonary effort is normal.  Musculoskeletal:     Cervical back: Normal range of motion and neck supple.  Neurological:     Mental Status: She is alert.  Psychiatric:        Mood and Affect: Mood normal.        Behavior: Behavior normal.        Thought Content: Thought content normal.        Judgment: Judgment normal.     LMP  (LMP Unknown)  Wt Readings from Last 3 Encounters:  12/28/19 210 lb 1.6 oz (95.3 kg)  07/15/19 202 lb 12.8 oz (92 kg)  04/16/19 198 lb (89.8 kg)    Health Maintenance Due  Topic Date Due  . Hepatitis C Screening  Never done  . COVID-19  Vaccine (1) Never done  . INFLUENZA VACCINE  08/02/2019    There are no preventive care reminders to display for this patient.   Lab Results  Component Value Date  TSH 1.26 04/16/2019   Lab Results  Component Value Date   WBC 9.9 04/16/2019   HGB 13.1 04/16/2019   HCT 39.1 04/16/2019   MCV 92.3 04/16/2019   PLT 305.0 04/16/2019   Lab Results  Component Value Date   NA 137 04/16/2019   K 3.9 04/16/2019   CO2 30 04/16/2019   GLUCOSE 89 04/16/2019   BUN 10 04/16/2019   CREATININE 1.12 04/16/2019   BILITOT 0.4 04/16/2019   ALKPHOS 54 04/16/2019   AST 13 04/16/2019   ALT 10 04/16/2019   PROT 7.2 04/16/2019   ALBUMIN 4.1 04/16/2019   CALCIUM 9.3 04/16/2019   ANIONGAP 7 03/20/2018   GFR 67.08 04/16/2019   Lab Results  Component Value Date   CHOL 190 08/11/2018   Lab Results  Component Value Date   HDL 57.90 08/11/2018   Lab Results  Component Value Date   LDLCALC 115 (H) 08/11/2018   Lab Results  Component Value Date   TRIG 86.0 08/11/2018   Lab Results  Component Value Date   CHOLHDL 3 08/11/2018   No results found for: HGBA1C     Assessment & Plan:   Problem List Items Addressed This Visit   None   Visit Diagnoses    Diarrhea, unspecified type    -  Primary   Relevant Medications   azithromycin (ZITHROMAX) 250 MG tablet   Nausea       Relevant Medications   ondansetron (ZOFRAN) 4 MG tablet   Chills       Suspected COVID-19 virus infection           Meds ordered this encounter  Medications  . ondansetron (ZOFRAN) 4 MG tablet    Sig: Take 1 tablet (4 mg total) by mouth every 8 (eight) hours as needed for nausea or vomiting.    Dispense:  20 tablet    Refill:  0    Order Specific Question:   Supervising Provider    Answer:   MILLER, BRIAN [[id number]]  . azithromycin (ZITHROMAX) 250 MG tablet    Sig: Take 2 tablets (500 mg total) by mouth daily for 3 days.    Dispense:  6 tablet    Refill:  0    Order Specific Question:   Supervising  Provider    Answer:   Eber Hong [[id number]]     Morgan Lapping, PA-C

## 2020-03-23 ENCOUNTER — Encounter: Payer: Self-pay | Admitting: Physician Assistant

## 2020-03-30 ENCOUNTER — Ambulatory Visit: Payer: Managed Care, Other (non HMO)

## 2020-04-11 ENCOUNTER — Ambulatory Visit: Payer: Managed Care, Other (non HMO)

## 2020-04-13 ENCOUNTER — Other Ambulatory Visit: Payer: Managed Care, Other (non HMO)

## 2020-04-13 ENCOUNTER — Encounter: Payer: Self-pay | Admitting: Family Medicine

## 2020-04-14 ENCOUNTER — Ambulatory Visit: Payer: Managed Care, Other (non HMO)

## 2020-04-14 ENCOUNTER — Other Ambulatory Visit: Payer: Self-pay

## 2020-04-14 ENCOUNTER — Telehealth (INDEPENDENT_AMBULATORY_CARE_PROVIDER_SITE_OTHER): Payer: 59 | Admitting: Psychiatry

## 2020-04-14 ENCOUNTER — Encounter (HOSPITAL_COMMUNITY): Payer: Self-pay | Admitting: Psychiatry

## 2020-04-14 DIAGNOSIS — F319 Bipolar disorder, unspecified: Secondary | ICD-10-CM

## 2020-04-14 DIAGNOSIS — F902 Attention-deficit hyperactivity disorder, combined type: Secondary | ICD-10-CM

## 2020-04-14 MED ORDER — TRAZODONE HCL 100 MG PO TABS: 100.0000 mg | ORAL_TABLET | Freq: Every evening | ORAL | 0 refills | Status: DC | PRN

## 2020-04-14 MED ORDER — ATOMOXETINE HCL 60 MG PO CAPS
60.0000 mg | ORAL_CAPSULE | Freq: Every day | ORAL | 0 refills | Status: DC
Start: 2020-04-14 — End: 2020-06-30

## 2020-04-14 MED ORDER — ARIPIPRAZOLE 5 MG PO TABS: ORAL_TABLET | ORAL | 0 refills | Status: DC

## 2020-04-14 NOTE — Progress Notes (Signed)
Virtual Visit via Telephone Note  I connected with Morgan Oneal on 04/14/20 at 10:40 AM EDT by telephone and verified that I am speaking with the correct person using two identifiers.  Location: Patient: Home Provider: Home Office   I discussed the limitations, risks, security and privacy concerns of performing an evaluation and management service by telephone and the availability of in person appointments. I also discussed with the patient that there may be a patient responsible charge related to this service. The patient expressed understanding and agreed to proceed.   History of Present Illness: Patient is evaluated by phone session.  She quit the job at [employer] in first week of February and then she took few weeks medication and went to [place].  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  She is also working as a Medical illustrator and she has another job offer in [place].  Patient feels less stressed and less irritable since she quit the job.  However she still have insomnia and despite taking trazodone she has racing thoughts.  She describes her mood is stable and denies any highs and lows, mania, psychosis or any hallucination.  She denies any suicidal thoughts or homicidal thoughts.  Her attention and focus is good with Strattera.  She has no tremors shakes or any EPS.  She admitted weight gain because she is not as active but now she has a plan to hire a personal trainer so she can lose weight.  She also wondering if we can get appointment to see a therapist to help her coping skills.  She has not back to school but thinking about restarting in summer.  Patient has 3 children who are 86, 25 and 69 years old.  She had a support from her mother and friends.  Patient denies any impulsive behavior.  She feels Abilify helping.   Past Psychiatric History: H/OADHDand givenAdderall but no details.H/Ocutting  herself when parents going through divorce. Noh/oinpatient, suicidal attempt,psychosis,mood swings, highs and lows, anger, irritability.H/O hevay ETOH.  Psychiatric Specialty Exam: Physical Exam  Review of Systems  Weight 226 lb (102.5 kg).There is no height or weight on file to calculate BMI.  General Appearance: NA  Eye Contact:  NA  Speech:  Clear and Coherent  Volume:  Normal  Mood:  Euthymic  Affect:  NA  Thought Process:  Goal Directed  Orientation:  Full (Time, Place, and Person)  Thought Content:  Logical  Suicidal Thoughts:  No  Homicidal Thoughts:  No  Memory:  Immediate;   Good Recent;   Good Remote;   Good  Judgement:  Intact  Insight:  Present  Psychomotor Activity:  NA  Concentration:  Concentration: Fair and Attention Span: Fair  Recall:  Good  Fund of Knowledge:  Good  Language:  Good  Akathisia:  No  Handed:  Right  AIMS (if indicated):     Assets:  Communication Skills Desire for Improvement Housing Resilience Social Support Transportation  ADL's:  Intact  Cognition:  WNL  Sleep:   fair      Assessment and Plan: Bipolar disorder type I.  ADHD, combined type.  Patient reported her quitting the job helped her stress and mood but is still have difficulty sleeping at night.  Recommend to try trazodone 100 mg at bedtime.  Continue Abilify 5 mg daily and Strattera 60 mg daily.  Patient like to see a therapist and  we will see if we can find a therapist in our office.  Discussed medication side effects and benefits.  Discussed weight gain in recent months as patient has a plan to hire a Systems analyst and I encouraged that plan.  Recommended to call us back if she is any question or any concern.  Follow-up in 3 months.  Follow Up Instructions:    I discussed the assessment and treatment plan with the patient. The patient was provided an opportunity to ask questions and all were answered. The patient agreed with the plan and demonstrated an  understanding of the instructions.   The patient was advised to call back or seek an in-person evaluation if the symptoms worsen or if the condition fails to improve as anticipated.  I provided 18 minutes of non-face-to-face time during this encounter.   Cleotis Nipper, MD

## 2020-04-18 IMAGING — US TRANSVAGINAL ULTRASOUND OF PELVIS
1 series · 14 of 25 positions shown · non-contrast
Comparison: None.

CLINICAL DATA: Pelvic pain

EXAM:
TRANSABDOMINAL AND TRANSVAGINAL ULTRASOUND OF PELVIS
DOPPLER ULTRASOUND OF OVARIES
TECHNIQUE: Study was performed transabdominally to optimize pelvic field of
view evaluation and transvaginally to optimize internal visceral
architecture evaluation. Color and duplex Doppler ultrasound was
utilized to evaluate blood flow to the ovaries.

[Series 1: transvaginal ultrasound of pelvis · 14 of 50 slices shown]
[im 1/50]
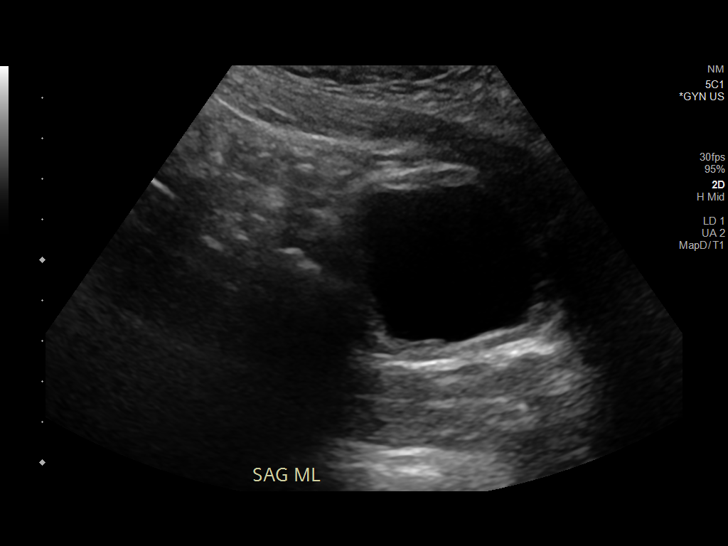
[im 5/50]
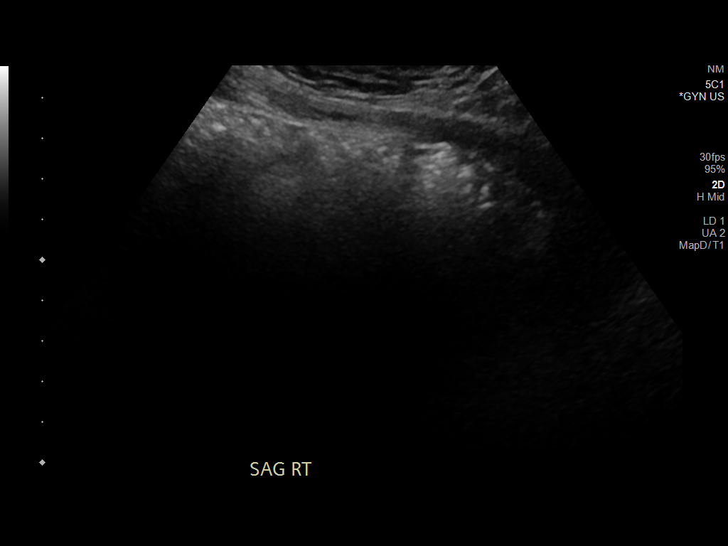
[im 9/50]
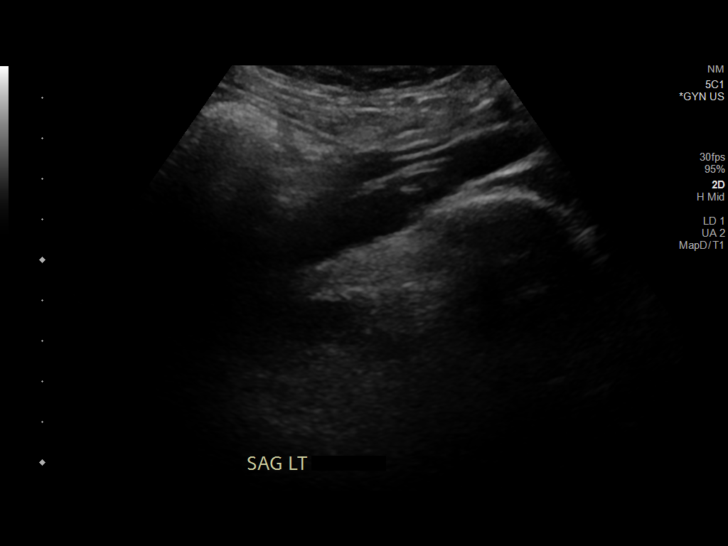
[im 13/50]
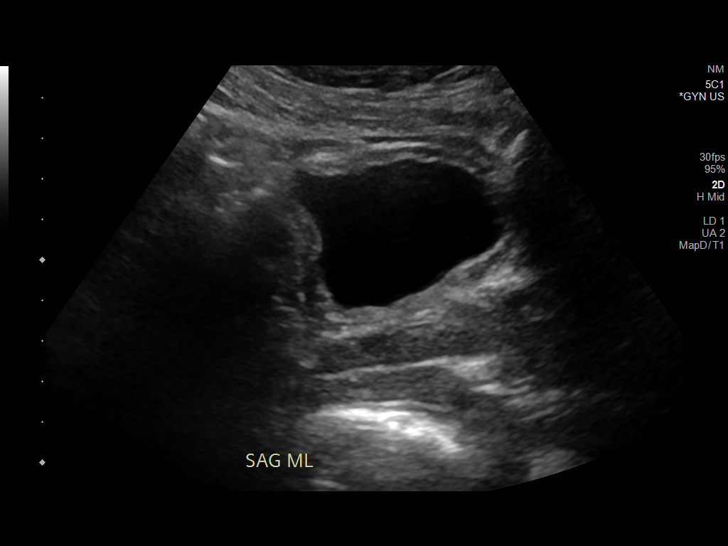
[im 17/50]
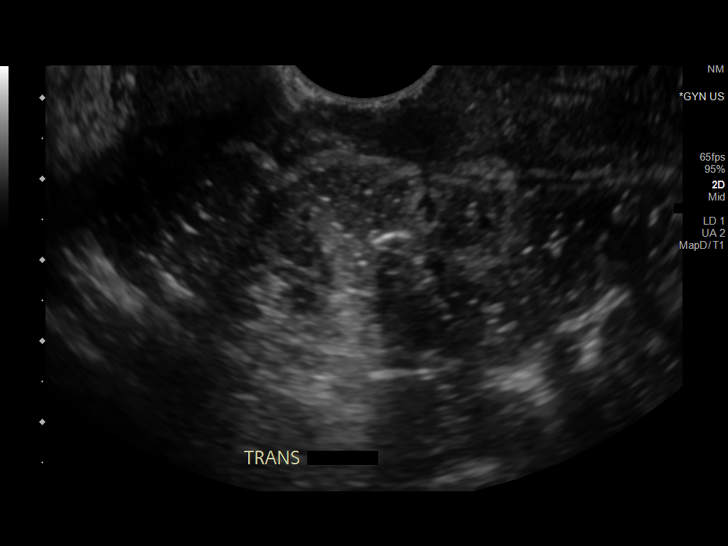
[im 19/50]
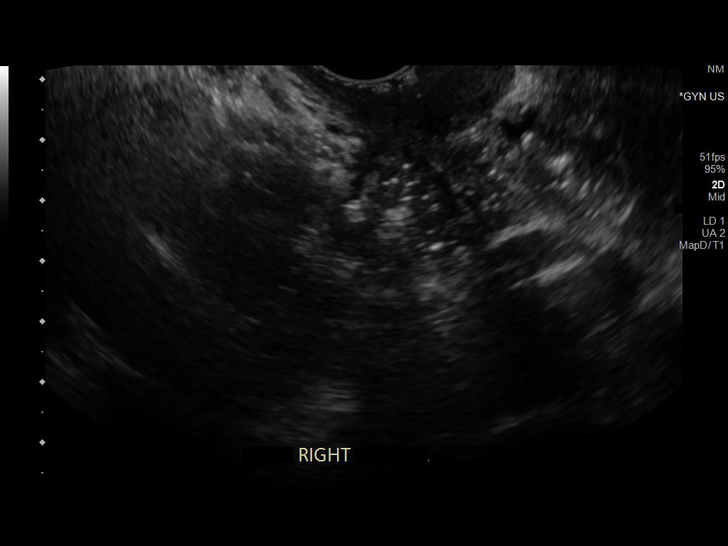
[im 23/50]
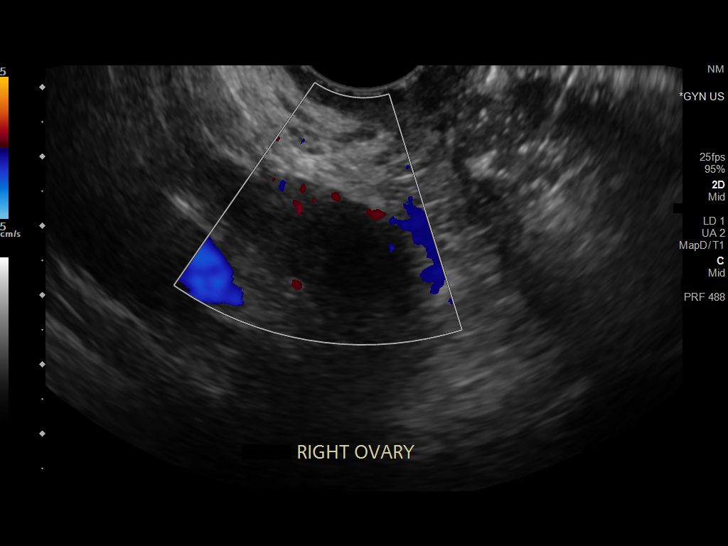
[im 27/50]
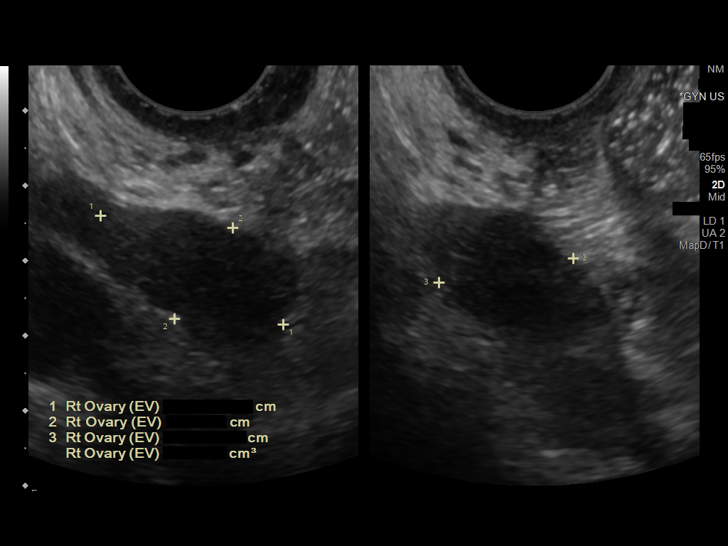
[im 31/50]
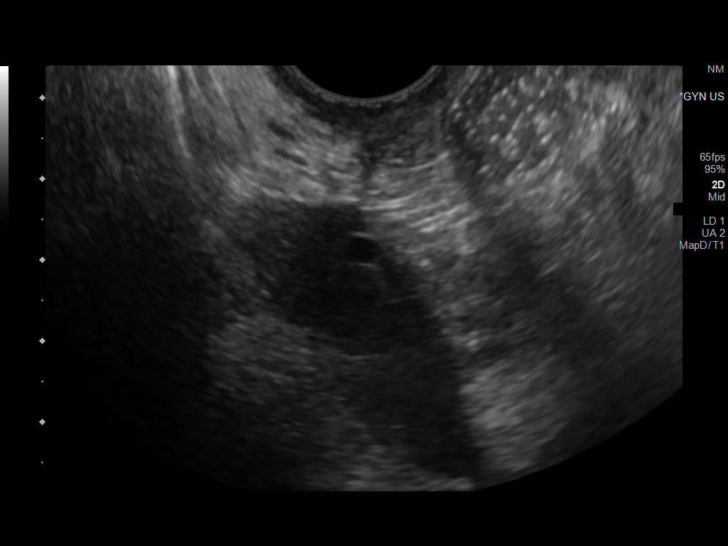
[im 33/50]
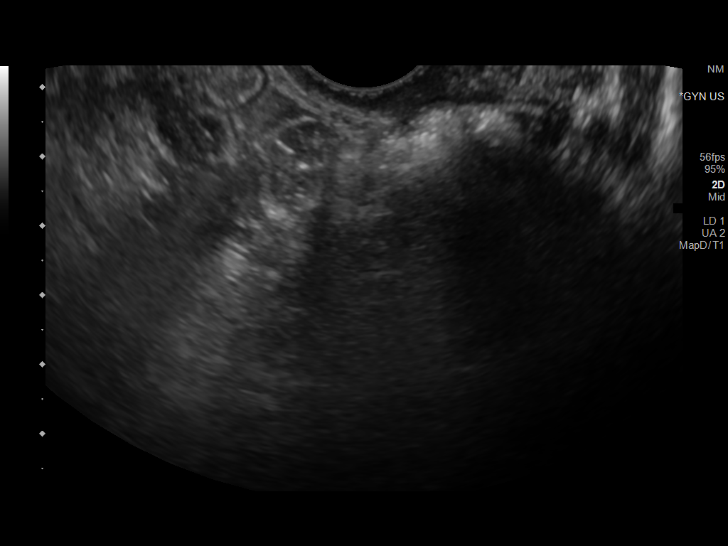
[im 37/50]
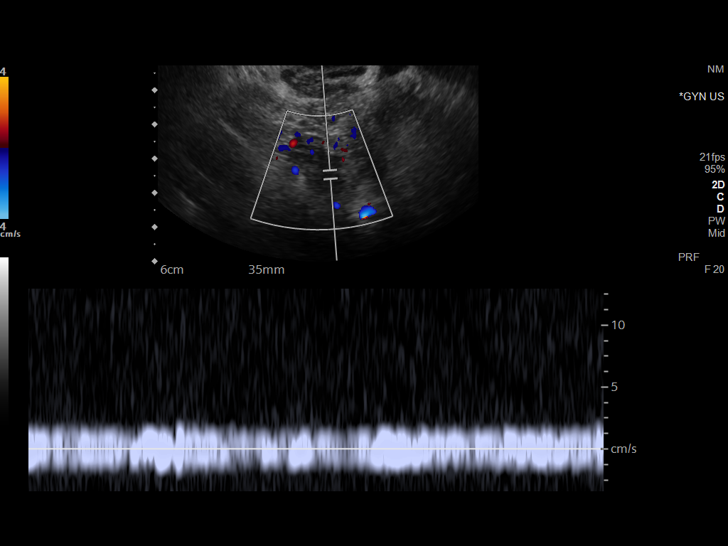
[im 41/50]
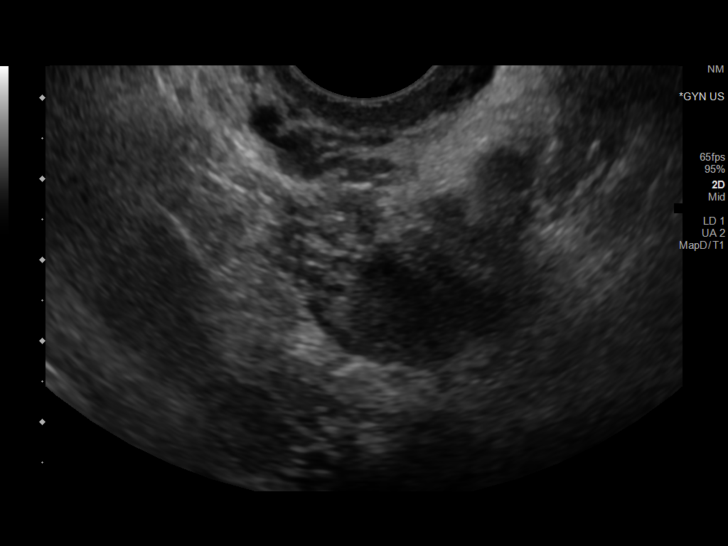
[im 45/50]
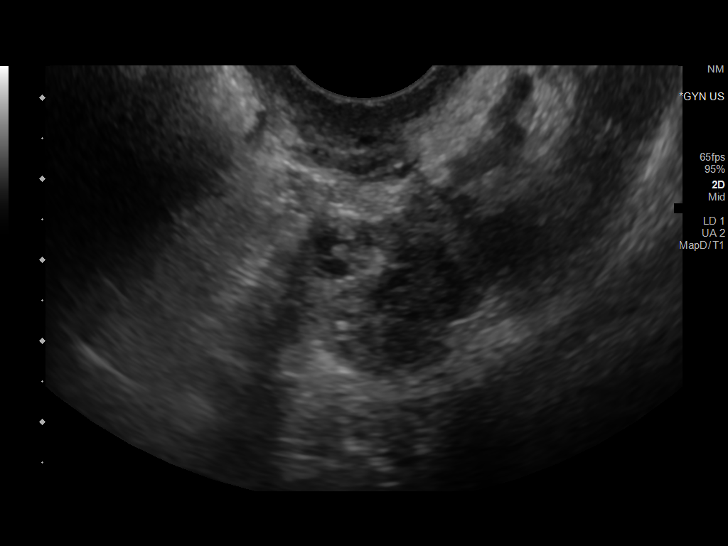
[im 50/50]
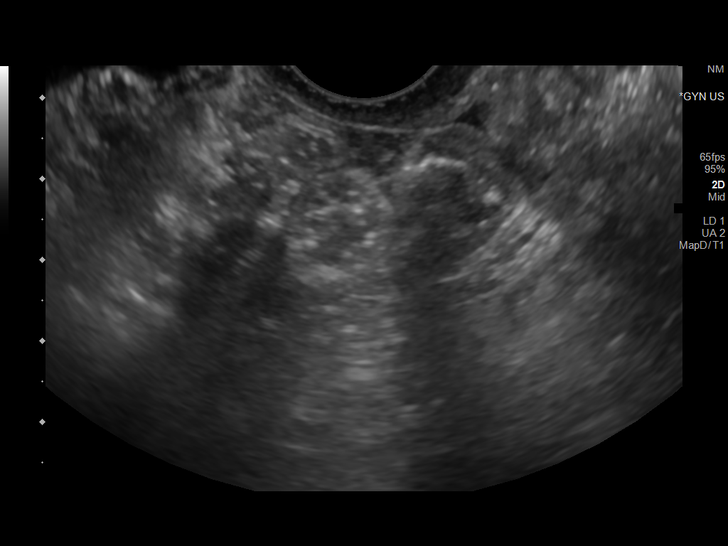

[14 of 25 positions shown; findings below may reference images not displayed]

FINDINGS: Uterus

Surgically absent.

Right ovary

Measurements: 2.9 x 1.5 x 1.8 cm = volume: 3.9 mL. Normal
appearance/no adnexal mass.

Left ovary

Measurements: 2.4 x 1.4 x 1.6 cm = volume: 2.6 mL. Normal
appearance/no adnexal mass.

Pulsed Doppler evaluation of both ovaries demonstrates normal
low-resistance arterial and venous waveforms.

Other findings

No abnormal free fluid.
IMPRESSION: Normal appearing ovaries bilaterally. No demonstrable ovarian
torsion. No pelvic mass or free pelvic fluid. Uterus absent.

## 2020-04-24 ENCOUNTER — Emergency Department (HOSPITAL_COMMUNITY): Payer: Self-pay

## 2020-04-24 ENCOUNTER — Other Ambulatory Visit: Payer: Self-pay

## 2020-04-24 ENCOUNTER — Emergency Department (HOSPITAL_COMMUNITY)
Admission: EM | Admit: 2020-04-24 | Discharge: 2020-04-24 | Disposition: A | Discharge status: Home / Self Care | Payer: Self-pay | Attending: Emergency Medicine | Admitting: Emergency Medicine

## 2020-04-24 ENCOUNTER — Encounter (HOSPITAL_COMMUNITY): Payer: Self-pay | Admitting: *Deleted

## 2020-04-24 DIAGNOSIS — R519 Headache, unspecified: Secondary | ICD-10-CM | POA: Insufficient documentation

## 2020-04-24 DIAGNOSIS — H53149 Visual discomfort, unspecified: Secondary | ICD-10-CM | POA: Insufficient documentation

## 2020-04-24 DIAGNOSIS — R112 Nausea with vomiting, unspecified: Secondary | ICD-10-CM | POA: Insufficient documentation

## 2020-04-24 DIAGNOSIS — J45909 Unspecified asthma, uncomplicated: Secondary | ICD-10-CM | POA: Insufficient documentation

## 2020-04-24 MED ORDER — KETOROLAC TROMETHAMINE 15 MG/ML IJ SOLN
15.0000 mg | Freq: Once | INTRAMUSCULAR | Status: AC
  Administered 2020-04-24: 15 mg via INTRAVENOUS
  Filled 2020-04-24: qty 1

## 2020-04-24 MED ORDER — SODIUM CHLORIDE 0.9 % IV BOLUS
1000.0000 mL | Freq: Once | INTRAVENOUS | Status: AC
  Administered 2020-04-24: 1000 mL via INTRAVENOUS

## 2020-04-24 MED ORDER — DEXAMETHASONE SODIUM PHOSPHATE 10 MG/ML IJ SOLN
10.0000 mg | Freq: Once | INTRAMUSCULAR | Status: AC
  Administered 2020-04-24: 10 mg via INTRAVENOUS
  Filled 2020-04-24: qty 1

## 2020-04-24 MED ORDER — DIPHENHYDRAMINE HCL 50 MG/ML IJ SOLN
25.0000 mg | Freq: Once | INTRAMUSCULAR | Status: AC
  Administered 2020-04-24: 25 mg via INTRAVENOUS
  Filled 2020-04-24: qty 1

## 2020-04-24 MED ORDER — METOCLOPRAMIDE HCL 5 MG/ML IJ SOLN
10.0000 mg | Freq: Once | INTRAMUSCULAR | Status: AC
  Administered 2020-04-24: 10 mg via INTRAVENOUS
  Filled 2020-04-24: qty 2

## 2020-04-24 NOTE — ED Notes (Signed)
Patient discharge instructions reviewed with the patient. The patient verbalized understanding of instructions. Patient discharged. 

## 2020-04-24 NOTE — ED Triage Notes (Signed)
Headache doe 2 days with nausea and vomiting hs of migraines  This headache has not gone away  lmp none  She has taken excedrin without  relief

## 2020-04-24 NOTE — Discharge Instructions (Addendum)
-  Head CT was normal.  Today you were evaluated at the emergency department for a headache.  1. Medications: continue usual home medications 2. Treatment: rest, drink plenty of fluids, if headache persists take ibuprofen with caffeine 3. Follow Up: Please followup with your primary doctor in 3 days and neurology within 1 week for discussion of your diagnoses and further evaluation after today's visit; if you do not have a primary care doctor use the resource guide provided to find one; Please return to the ER for double vision, speech difficulty, gait disturbance, persistent vomiting or other concerns.  Headache:  You are having a headache. No specific cause was found today for your headache. It may have been a migraine or other cause of headache. Stress, anxiety, fatigue, and depression are common triggers for headaches. Your headache today does not appear to be life-threatening or require hospitalization, but often the exact cause of headaches is not determined in the emergency department. Therefore, followup with your doctor is very important to find out what may have caused your headache, and whether or not you need any further diagnostic testing or treatment. Sometimes headaches can appear benign but then more serious symptoms can develop which should prompt an immediate reevaluation by your doctor or the emergency department.  Hydration: Have a goal of about a half liter of water every couple hours to stay well hydrated.   Sleep: Please be sure to get plenty of sleep with a goal of 8 hours per night. Having a regular bed time and bedtime routine can help with this.  Screens: Reduce the amount of time you are in front of screens.  Take about a 5-10-minute break every hour or every couple hours to give your eyes rest.  Do not use screens in dark rooms.  Glasses with a blue light filter may also help reduce eye fatigue.  Stress: Take steps to reduce stress as much as possible.   Seek  immediate medical attention if:  You develop possible problems with medications prescribed. The medications don't resolve your headache, if it recurs, or if you have multiple episodes of vomiting or can't take fluids by mouth You have a change from the usual headache. If you developed a sudden severe headache or confusion, become poorly responsive or faint, developed a fever above 100.4 or problems breathing, have a change in speech, vision, swallowing or understanding, or developed new weakness, numbness, tingling, incoordination or have a seizure.

## 2020-04-24 NOTE — ED Provider Notes (Signed)
[facility] EMERGENCY DEPARTMENT Provider Note   CSN: [id number] Arrival date & time: 04/24/20  0551     History Chief Complaint  Patient presents with  . Headache    Morgan Oneal is a 35 y.o. female with past medical history significant for asthma, depression, Raynaud's disease.  Surgical history includes hysterectomy.  HPI Patient presents to emergency room today with chief complaint of headache x 6 days that has been progressively worsening.  She states pain is a throbbing sensation located behind her left eye.  She has a history of headaches however states they normally do not last this long.  She has been taking Excedrin without any symptom improvement.  She rates the pain currently 10 out of 10 in severity.  She endorses associated photophobia and phonophobia. She has had nausea and vomiting.  She reports 6 episodes of nonbloody nonbilious emesis in the last 24 hours.  She denies any suspicious food intake.  Patient states she did just get back from a cruise last week.  She felt seasick the whole time and spent most of the time in her room.  She denies any fall or head injury.  Patient drinks alcohol socially, denies any drug use.  Denies fever, syncope, head trauma,  UL throbbing, visual changes, stiff neck, neck pain, rash, or "thunderclap" onset.        Past Medical History:  Diagnosis Date  . Asthma   . Depression     Patient Active Problem List   Diagnosis Date Noted  . Depression, recurrent (HCC) 08/11/2018  . Urge incontinence 08/01/2017  . Raynaud's disease, idiopathic 07/24/2017    Past Surgical History:  Procedure Laterality Date  . PARTIAL HYSTERECTOMY  2015     OB History   No obstetric history on file.     Family History  Problem Relation Age of Onset  . Arthritis Father   . Arthritis Maternal Grandmother   . Diabetes Maternal Grandmother   . Arthritis Paternal Grandmother   . Asthma Paternal Grandmother   . Cancer Paternal  Grandmother   . Alcohol abuse Paternal Grandfather     Social History   Tobacco Use  . Smoking status: Never Smoker  . Smokeless tobacco: Never Used  Substance Use Topics  . Alcohol use: Yes    Comment: maybe once/week   . Drug use: Never    Home Medications Prior to Admission medications   Medication Sig Start Date End Date Taking? Authorizing Provider  aspirin-acetaminophen-caffeine (EXCEDRIN MIGRAINE) [phone] MG tablet Take 1 tablet by mouth every 6 (six) hours as needed for headache.   Yes [provider]  ARIPiprazole (ABILIFY) 5 MG tablet Take one tab daily Patient not taking: Reported on 04/24/2020 04/14/20   Arfeen, Phillips Grout, MD  atomoxetine (STRATTERA) 60 MG capsule Take 1 capsule (60 mg total) by mouth daily. Patient not taking: Reported on 04/24/2020 04/14/20   Arfeen, Kathi Ludwig T, MD  budesonide (PULMICORT) 0.5 MG/2ML nebulizer solution Take 2 mLs (0.5 mg total) by nebulization 2 (two) times daily as needed. Patient not taking: Reported on 04/24/2020 01/08/20   [name], MD  ondansetron (ZOFRAN) 4 MG tablet Take 1 tablet (4 mg total) by mouth every 8 (eight) hours as needed for nausea or vomiting. Patient not taking: Reported on 04/24/2020 03/12/20   Demetrio Lapping, PA-C  traZODone (DESYREL) 100 MG tablet Take 1 tablet (100 mg total) by mouth at bedtime as needed. for sleep Patient not taking: Reported on 04/24/2020 04/14/20  Arfeen, Phillips Grout, MD  Vitamin D, Ergocalciferol, (DRISDOL) 1.25 MG (50000 UNIT) CAPS capsule TAKE ONE CAPSULE BY MOUTH ONCE A WEEK FOR 8 WEEKS. THEN TAKE 1000IU/DAY Patient not taking: Reported on 04/24/2020 05/22/19   [name], MD    Allergies    Mucinex [guaifenesin er] and Penicillins  Review of Systems   Review of Systems All other systems are reviewed and are negative for acute change except as noted in the HPI.  Physical Exam Updated Vital Signs BP 131/75 (BP Location: Left Arm)   Pulse 83   Temp 98.7 F (37.1 C) (Oral)   Resp  18   Ht 5\' 7"  (1.702 m)   Wt 102.5 kg   LMP  (LMP Unknown)   SpO2 100%   BMI 35.39 kg/m   Physical Exam Vitals and nursing note reviewed.  Constitutional:      General: She is not in acute distress.    Appearance: She is not ill-appearing.     Comments: Looks uncomfortable.  Holding a blanket over her face with the lights off in the room  HENT:     Head: Normocephalic and atraumatic.     Comments: No tenderness to palpation of skull. No deformities or crepitus noted. No open wounds, abrasions or lacerations.  No sinus or temporal tenderness.     Right Ear: Tympanic membrane and external ear normal.     Left Ear: Tympanic membrane and external ear normal.     Nose: Nose normal.     Mouth/Throat:     Mouth: Mucous membranes are moist.     Pharynx: Oropharynx is clear.  Eyes:     General: No scleral icterus.       Right eye: No discharge.        Left eye: No discharge.     Extraocular Movements: Extraocular movements intact.     Conjunctiva/sclera: Conjunctivae normal.     Pupils: Pupils are equal, round, and reactive to light.  Neck:     Vascular: No JVD.     Comments: Full ROM intact without spinous process TTP. No bony stepoffs or deformities, no paraspinous muscle TTP or muscle spasms. No rigidity or meningeal signs. No bruising, erythema, or swelling.  Cardiovascular:     Rate and Rhythm: Normal rate and regular rhythm.     Pulses: Normal pulses.          Radial pulses are 2+ on the right side and 2+ on the left side.     Heart sounds: Normal heart sounds.  Pulmonary:     Comments: Lungs clear to auscultation in all fields. Symmetric chest rise. No wheezing, rales, or rhonchi. Abdominal:     Comments: Abdomen is soft, non-distended, and non-tender in all quadrants. No rigidity, no guarding. No peritoneal signs.  Musculoskeletal:        General: Normal range of motion.     Cervical back: Normal range of motion.  Skin:    General: Skin is warm and dry.     Capillary  Refill: Capillary refill takes less than 2 seconds.  Neurological:     Mental Status: She is oriented to person, place, and time.     GCS: GCS eye subscore is 4. GCS verbal subscore is 5. GCS motor subscore is 6.     Comments: Speech is clear and goal oriented, follows commands CN III-XII intact, no facial droop Normal strength in upper and lower extremities bilaterally including dorsiflexion and plantar flexion, strong and equal grip strength  Sensation normal to light and sharp touch Moves extremities without ataxia, coordination intact Normal finger to nose and rapid alternating movements Normal gait and balance  Psychiatric:        Behavior: Behavior normal.     ED Results / Procedures / Treatments   Labs (all labs ordered are listed, but only abnormal results are displayed) Labs Reviewed - No data to display  EKG None  Radiology CT Head Wo Contrast  Result Date: 04/24/2020 CLINICAL DATA:  Headache, classic migraine.  Persistent vomiting. EXAM: CT HEAD WITHOUT CONTRAST TECHNIQUE: Contiguous axial images were obtained from the base of the skull through the vertex without intravenous contrast. COMPARISON:  None. FINDINGS: Brain: No evidence of acute infarction, hemorrhage, hydrocephalus, extra-axial collection or mass lesion/mass effect. Incidental mineralization symmetrically at the globus pallidus. Vascular: No hyperdense vessel or unexpected calcification. Skull: Normal. Negative for fracture or focal lesion. Sinuses/Orbits: No acute finding. IMPRESSION: Negative head CT. Electronically Signed   By: Marnee Spring M.D.   On: 04/24/2020 07:54    Procedures Procedures   Medications Ordered in ED Medications  sodium chloride 0.9 % bolus 1,000 mL (1,000 mLs Intravenous New Bag/Given 04/24/20 0722)  metoCLOPramide (REGLAN) injection 10 mg (10 mg Intravenous Given 04/24/20 0716)  dexamethasone (DECADRON) injection 10 mg (10 mg Intravenous Given 04/24/20 0718)  diphenhydrAMINE  (BENADRYL) injection 25 mg (25 mg Intravenous Given 04/24/20 0717)  ketorolac (TORADOL) 15 MG/ML injection 15 mg (15 mg Intravenous Given 04/24/20 1779)    ED Course  I have reviewed the triage vital signs and the nursing notes.  Pertinent labs & imaging results that were available during my care of the patient were reviewed by me and considered in my medical decision making (see chart for details).    MDM Rules/Calculators/A&P                          Pt HA treated and improved while in ED. commended in triage that patient had unknown LMP so pregnancy test was ordered.  Patient had hysterectomy, therefore test was canceled.  Abdomen is nontender, no peritoneal signs.  Presentation is like pts typical HA and non concerning for [facility], ICH, Meningitis, or temporal arteritis. Pt is afebrile with no focal neuro deficits, nuchal rigidity.  Given patient's emesis and persistent headache x5 days CT head obtained and shows no acute abnormalities.  Patient given IV fluids, Benadryl, Reglan and Decadron.  On reassessment pain has improved.  She is tolerating p.o. intake.  Given she has negative imaging will add Toradol to help with pain control.  Pt is to follow up with PCP to discuss prophylactic medication. Pt verbalizes understanding and is agreeable with plan to dc.   Portions of this note were generated with Scientist, clinical (histocompatibility and immunogenetics). Dictation errors may occur despite best attempts at proofreading.  Final Clinical Impression(s) / ED Diagnoses Final diagnoses:  Acute nonintractable headache, unspecified headache type    Rx / DC Orders ED Discharge Orders    None       Shanon Ace, PA-C 04/24/20 3903    Pricilla Loveless, MD 04/26/20 [phone]

## 2020-05-10 ENCOUNTER — Ambulatory Visit (HOSPITAL_COMMUNITY): Payer: Self-pay | Admitting: Clinical

## 2020-05-10 ENCOUNTER — Other Ambulatory Visit: Payer: Self-pay

## 2020-06-08 ENCOUNTER — Ambulatory Visit (HOSPITAL_COMMUNITY): Payer: Self-pay | Admitting: Clinical

## 2020-06-21 ENCOUNTER — Ambulatory Visit (HOSPITAL_COMMUNITY): Payer: Self-pay | Admitting: Clinical

## 2020-06-21 ENCOUNTER — Other Ambulatory Visit: Payer: Self-pay

## 2020-06-30 ENCOUNTER — Telehealth (INDEPENDENT_AMBULATORY_CARE_PROVIDER_SITE_OTHER): Payer: Self-pay | Admitting: Psychiatry

## 2020-06-30 ENCOUNTER — Other Ambulatory Visit: Payer: Self-pay

## 2020-06-30 ENCOUNTER — Encounter (HOSPITAL_COMMUNITY): Payer: Self-pay | Admitting: Psychiatry

## 2020-06-30 DIAGNOSIS — F319 Bipolar disorder, unspecified: Secondary | ICD-10-CM

## 2020-06-30 DIAGNOSIS — F902 Attention-deficit hyperactivity disorder, combined type: Secondary | ICD-10-CM

## 2020-06-30 MED ORDER — ATOMOXETINE HCL 60 MG PO CAPS: 60.0000 mg | ORAL_CAPSULE | Freq: Every day | ORAL | 0 refills | Status: DC

## 2020-06-30 MED ORDER — ARIPIPRAZOLE 5 MG PO TABS: ORAL_TABLET | ORAL | 0 refills | Status: DC

## 2020-06-30 MED ORDER — TRAZODONE HCL 100 MG PO TABS: 100.0000 mg | ORAL_TABLET | Freq: Every evening | ORAL | 0 refills | Status: DC | PRN

## 2020-06-30 NOTE — Progress Notes (Signed)
Virtual Visit via Telephone Note  I connected with Morgan Oneal on 06/30/20 at  2:20 PM EDT by telephone and verified that I am speaking with the correct person using two identifiers.  Location: Patient: Work Provider: Economist   I discussed the limitations, risks, security and privacy concerns of performing an evaluation and management service by telephone and the availability of in person appointments. I also discussed with the patient that there may be a patient responsible charge related to this service. The patient expressed understanding and agreed to proceed.   History of Present Illness: Patient is evaluated by phone session.  She is working full-time and also as a as needed in the hospital.  She admitted a lot of stress at home because mother who is disabled and does not work do not help at home.  She gets frustrated when things are not done at home.  She admitted getting easily drained as she has 3 children who are 43, 26 and 82 years old.  The billing mother had helped but now she is not helping as much.  She also endorsed not able to see a therapist in a while and actively looking for therapy appointment.  She denies any mania, psychosis, hallucination.  She is happy because she is going to started school in fall at [employer] this year.  She reported her medicine is working however she need to work on her coping skills as she gets stressed at home.  Her attention, concentration is good.  She is able to work and do multitasking without any issues.  She endorsed quit her medicine for 1 week when she had a cruise trip but now she is back to taking medication.  She admitted to occasionally drinking but denies any intoxication, blackouts, tremors.  Past Psychiatric History: H/O ADHD and given Adderall but no details. H/O cutting herself when parents going through divorce.  No h/o inpatient, suicidal attempt, psychosis, mood swings, highs and lows, anger, irritability.   H/O hevay ETOH.    Psychiatric Specialty Exam: Physical Exam  Review of Systems  Weight 215 lb (97.5 kg).There is no height or weight on file to calculate BMI.  General Appearance: NA  Eye Contact:  NA  Speech:  Clear and Coherent  Volume:  Normal  Mood:  Dysphoric and Irritable  Affect:  NA  Thought Process:  Goal Directed  Orientation:  NA  Thought Content:  Rumination  Suicidal Thoughts:  No  Homicidal Thoughts:  No  Memory:  Immediate;   Good Recent;   Good Remote;   Good  Judgement:  Intact  Insight:  Shallow  Psychomotor Activity:  NA  Concentration:  Concentration: Fair and Attention Span: Fair  Recall:  Good  Fund of Knowledge:  Good  Language:  Good  Akathisia:  No  Handed:  Right  AIMS (if indicated):     Assets:  Communication Skills Desire for Improvement Housing Resilience Transportation  ADL's:  Intact  Cognition:  WNL  Sleep:   good      Assessment and Plan: Bipolar disorder type I.  ADHD, combined type.  She is sleeping better with increased trazodone.  We discussed family situation and recommend therapy appointment.  Patient is in the process of getting therapy appointment and I recommended if she do not find anyone then she should call us immediately.  Patient does not want to change the medication.  We will continue trazodone 100 mg at bedtime, Abilify 5 mg daily and Strattera  60 mg daily.  Patient will start school in fall.  We also talked about alcohol interaction with psychotropic medication.  Patient understand risk of alcohol and taking psychotropic medication.  Recommended to call us back if there is any question or any concern.  Follow-up in 3 months.  Follow Up Instructions:    I discussed the assessment and treatment plan with the patient. The patient was provided an opportunity to ask questions and all were answered. The patient agreed with the plan and demonstrated an understanding of the instructions.   The patient was advised to  call back or seek an in-person evaluation if the symptoms worsen or if the condition fails to improve as anticipated.  I provided 18 minutes of non-face-to-face time during this encounter.   Cleotis Nipper, MD

## 2020-07-19 ENCOUNTER — Other Ambulatory Visit: Payer: Self-pay

## 2020-07-19 ENCOUNTER — Ambulatory Visit (INDEPENDENT_AMBULATORY_CARE_PROVIDER_SITE_OTHER): Payer: Self-pay | Admitting: Clinical

## 2020-07-19 DIAGNOSIS — F319 Bipolar disorder, unspecified: Secondary | ICD-10-CM

## 2020-07-19 DIAGNOSIS — F902 Attention-deficit hyperactivity disorder, combined type: Secondary | ICD-10-CM
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
## 2020-07-27 ENCOUNTER — Ambulatory Visit (INDEPENDENT_AMBULATORY_CARE_PROVIDER_SITE_OTHER): Payer: Federal, State, Local not specified - PPO | Admitting: Clinical

## 2020-07-27 ENCOUNTER — Other Ambulatory Visit: Payer: Self-pay

## 2020-07-27 DIAGNOSIS — F902 Attention-deficit hyperactivity disorder, combined type: Secondary | ICD-10-CM | POA: Diagnosis not present

## 2020-07-27 DIAGNOSIS — F319 Bipolar disorder, unspecified: Secondary | ICD-10-CM | POA: Diagnosis not present

## 2020-07-27 NOTE — Progress Notes (Signed)
   THERAPIST PROGRESS NOTE  Session Time: 10am  Participation Level: Active  Behavioral Response: Casual and NeatAlertpleasant  Type of Therapy: Individual Therapy  Treatment Goals addressed: Coping  Interventions: Other: psychoeducation  Summary: Morgan Oneal is a 35 y.o. female who presents with challenges establishing and maintaining boundaries in her home. Pt reports her mother is living with her and says she challenges the boundaries she tries to establish in the home. Pt reports it has been brought to her attention by others the potential consequences that can occur if she continues to be inconsistent in her efforts. Pt also reports difficulty  with wavering when she tells others no.  Suicidal/Homicidal: Pt denies SI/HI no plan or intent to harm self or others reported.  Therapist Response: CSW assessed for changes in mood and behavior. CSW actively listened as pt discussed dynamics of relationship with children and mother. CSW reviewed and provided pt with information on setting personal boundaries. CSW processed and modeled for pt how to say no with no ambiguity.  Plan: Return again in 2 weeks.  Diagnosis: Axis I: Bipolar I disorder    ADHD combined type    Axis II: No diagnosis    Suzan Slick, LCSW 07/27/2020

## 2020-08-02 ENCOUNTER — Other Ambulatory Visit: Payer: Self-pay

## 2020-08-02 ENCOUNTER — Ambulatory Visit (INDEPENDENT_AMBULATORY_CARE_PROVIDER_SITE_OTHER): Payer: Federal, State, Local not specified - PPO | Admitting: Clinical

## 2020-08-02 DIAGNOSIS — Z566 Other physical and mental strain related to work: Secondary | ICD-10-CM

## 2020-08-02 DIAGNOSIS — F902 Attention-deficit hyperactivity disorder, combined type: Secondary | ICD-10-CM

## 2020-08-02 DIAGNOSIS — F319 Bipolar disorder, unspecified: Secondary | ICD-10-CM | POA: Diagnosis not present

## 2020-08-02 NOTE — Progress Notes (Signed)
   THERAPIST PROGRESS NOTE  Session Time: 10am  Participation Level: Active  Behavioral Response: NAAlert"up and down"  Type of Therapy: Individual Therapy  Treatment Goals addressed: Anxiety and Coping  Interventions: CBT and Supportive Virtual Visit via Telephone Note  I connected with Morgan Oneal on 08/02/20 at 10:00 AM EDT by telephone and verified that I am speaking with the correct person using two identifiers.  Location: Patient: home Provider: office   I discussed the limitations, risks, security and privacy concerns of performing an evaluation and management service by telephone and the availability of in person appointments. I also discussed with the patient that there may be a patient responsible charge related to this service. The patient expressed understanding and agreed to proceed.   I discussed the assessment and treatment plan with the patient. The patient was provided an opportunity to ask questions and all were answered. The patient agreed with the plan and demonstrated an understanding of the instructions.   The patient was advised to call back or seek an in-person evaluation if the symptoms worsen or if the condition fails to improve as anticipated.  I provided 45 minutes of non-face-to-face time during this encounter.   Summary: JANESE Oneal is a 35 y.o. female who presents with difficulty connecting for virtual appt, phone session completed. Pt describes her mood as "up and down" and reports having stressful relationship with her mother. Pt says she has started to implement boundaries with her children but has experienced challenges with implementing boundaries with her mother. Per pt report, she has made attempts at implementing boundaries but was not consistent in doing so. Pt states work is draining her and she is having challenges trying to find a healthy work life balance.  Suicidal/Homicidal: Pt denies SI/HI no plan or intent to harm self or others  reported.  Therapist Response: CSW revised pt treatment plan and added additional goal, pt agreeable to revision. CSW continues to discuss with pt importance of boundaries and reviewed information previously provided on setting personal boundaries. CSW processed with pt importance of her knowing what her limitations and expectations are of household members. CSW probed for feedback on consequences of not having boundaries in a relationship. CSW informed pt of the importance of her being consistent with implementation of boundaries in her home and relationships with others.  Plan: Return again in 2 weeks.  Diagnosis: Axis I: Bipolar I disorder                                     ADHD combined type    Work related stress    Axis II: No diagnosis    Suzan Slick, LCSW 08/02/2020

## 2020-08-08 ENCOUNTER — Ambulatory Visit (INDEPENDENT_AMBULATORY_CARE_PROVIDER_SITE_OTHER): Payer: Federal, State, Local not specified - PPO | Admitting: Clinical

## 2020-08-08 ENCOUNTER — Other Ambulatory Visit: Payer: Self-pay

## 2020-08-08 DIAGNOSIS — Z566 Other physical and mental strain related to work: Secondary | ICD-10-CM | POA: Diagnosis not present

## 2020-08-08 DIAGNOSIS — F319 Bipolar disorder, unspecified: Secondary | ICD-10-CM | POA: Diagnosis not present

## 2020-08-08 DIAGNOSIS — F902 Attention-deficit hyperactivity disorder, combined type: Secondary | ICD-10-CM | POA: Diagnosis not present

## 2020-08-09 NOTE — Progress Notes (Signed)
   THERAPIST PROGRESS NOTE  Session Time: 10am  Participation Level: Active  Behavioral Response: NAAlertEuthymic  Type of Therapy: Individual Therapy  Treatment Goals addressed: Anxiety and Coping  Interventions: CBT Virtual Visit via Telephone Note  I connected with Morgan Oneal on 08/09/20 at 10:00 AM EDT by telephone and verified that I am speaking with the correct person using two identifiers.  Location: Patient: home Provider: office   I discussed the limitations, risks, security and privacy concerns of performing an evaluation and management service by telephone and the availability of in person appointments. I also discussed with the patient that there may be a patient responsible charge related to this service. The patient expressed understanding and agreed to proceed.   I discussed the assessment and treatment plan with the patient. The patient was provided an opportunity to ask questions and all were answered. The patient agreed with the plan and demonstrated an understanding of the instructions.   The patient was advised to call back or seek an in-person evaluation if the symptoms worsen or if the condition fails to improve as anticipated.  I provided 45 minutes of non-face-to-face time during this encounter.  Summary: Morgan Oneal is a 35 y.o. female who reports having difficulty setting boundaries with her family members. However, pt states she wrote her mother a letter expressing her feeling and expectations for her to continue to remain in her home. Pt says she has difficulty verbally expressing her feelings but enjoys writing or journaling to express her emotions. Additionally, pt says she is enforcing boundaries with her children as well and clearly defining rules and expectations in the home.   Suicidal/Homicidal: Pt denies SI/HI no plan, intent or attempt to harm self or others reported.  Therapist Response: CSW assessed for changes in mood, behavior and  daily functioning. CSW continues to process with pt how to establish and maintain personal boundaries. CSW processed with pt importance of identifying her limits. CSW brainstormed with pt examples of times where flexibility and compromise may be needed in her boundaries. CSW discussed with pt putting her thoughts on trial to challenge irrational beliefs.   Plan: Return again in 1 weeks.  Diagnosis: Axis I: Bipolar I disorder                                     ADHD combined type                                     Work related stress    Axis II: No diagnosis    Morgan Slick, LCSW 08/09/2020

## 2020-08-11 ENCOUNTER — Ambulatory Visit (HOSPITAL_COMMUNITY): Payer: Federal, State, Local not specified - PPO | Admitting: Clinical

## 2020-09-23 ENCOUNTER — Telehealth: Payer: Self-pay

## 2020-09-23 NOTE — Telephone Encounter (Signed)
I called the patient to get her scheduled in our office as a new patient , appointment was schedule and confirmed .

## 2020-09-29 ENCOUNTER — Telehealth (HOSPITAL_COMMUNITY): Payer: Self-pay | Admitting: Psychiatry

## 2020-10-11 ENCOUNTER — Encounter: Payer: Medicaid Other | Admitting: Physician Assistant

## 2020-10-11 ENCOUNTER — Telehealth (HOSPITAL_COMMUNITY): Payer: Federal, State, Local not specified - PPO | Admitting: Psychiatry

## 2020-10-11 ENCOUNTER — Ambulatory Visit: Payer: Federal, State, Local not specified - PPO | Admitting: Podiatry

## 2020-10-11 ENCOUNTER — Other Ambulatory Visit: Payer: Self-pay

## 2020-10-13 NOTE — Progress Notes (Deleted)
Missed appt

## 2020-10-14 ENCOUNTER — Ambulatory Visit (HOSPITAL_BASED_OUTPATIENT_CLINIC_OR_DEPARTMENT_OTHER): Payer: Federal, State, Local not specified - PPO | Admitting: Nurse Practitioner

## 2020-10-14 ENCOUNTER — Encounter (HOSPITAL_BASED_OUTPATIENT_CLINIC_OR_DEPARTMENT_OTHER): Payer: Self-pay

## 2020-10-14 ENCOUNTER — Ambulatory Visit: Payer: Federal, State, Local not specified - PPO | Admitting: Podiatry

## 2020-10-14 DIAGNOSIS — Z0000 Encounter for general adult medical examination without abnormal findings: Secondary | ICD-10-CM

## 2020-10-14 DIAGNOSIS — E559 Vitamin D deficiency, unspecified: Secondary | ICD-10-CM

## 2020-10-19 ENCOUNTER — Encounter (HOSPITAL_BASED_OUTPATIENT_CLINIC_OR_DEPARTMENT_OTHER): Payer: Self-pay | Admitting: Nurse Practitioner

## 2020-11-21 ENCOUNTER — Ambulatory Visit (INDEPENDENT_AMBULATORY_CARE_PROVIDER_SITE_OTHER): Payer: Federal, State, Local not specified - PPO | Admitting: Clinical

## 2020-11-21 ENCOUNTER — Other Ambulatory Visit: Payer: Self-pay

## 2020-11-21 DIAGNOSIS — H938X3 Other specified disorders of ear, bilateral: Secondary | ICD-10-CM | POA: Diagnosis not present

## 2020-11-21 DIAGNOSIS — F902 Attention-deficit hyperactivity disorder, combined type: Secondary | ICD-10-CM

## 2020-11-21 DIAGNOSIS — F319 Bipolar disorder, unspecified: Secondary | ICD-10-CM | POA: Diagnosis not present

## 2020-11-21 DIAGNOSIS — Z566 Other physical and mental strain related to work: Secondary | ICD-10-CM | POA: Diagnosis not present

## 2020-11-21 NOTE — Progress Notes (Addendum)
   THERAPIST PROGRESS NOTE  Session Time: 8am  Participation Level: Active  Behavioral Response: CasualAlertpleasant, tearful at times  Type of Therapy: Individual Therapy  Treatment Goals addressed: Coping  Interventions: Supportive Virtual Visit via Video Note  I connected with Morgan Oneal on 11/21/20 at  8:00 AM EST by a video enabled telemedicine application and verified that I am speaking with the correct person using two identifiers.  Location: Patient: home Provider: office   I discussed the limitations of evaluation and management by telemedicine and the availability of in person appointments. The patient expressed understanding and agreed to proceed.   I discussed the assessment and treatment plan with the patient. The patient was provided an opportunity to ask questions and all were answered. The patient agreed with the plan and demonstrated an understanding of the instructions.   The patient was advised to call back or seek an in-person evaluation if the symptoms worsen or if the condition fails to improve as anticipated.  I provided 60 minutes of non-face-to-face time during this encounter.  Summary: Last visit on 08/08/20. Pt presents in a pleasant mood but tearful during session. Pt reports having difficulty showing and expressing her emotions to others. Pt reports during her childhood this was not encouraged and she was scolded for doing so. Pt states she is more vocal and assertive in her professional life but says she is the opposite in her personal life. According to pt, she is more reserved with a number of defense mechanisms she uses to protect herself. Pt states "vulnerability=weakness" Pt discussed having a number of unhealthy relationships as a result of being vulnerable with others.  Suicidal/Homicidal: Pt denies SI/HI no plan intent or attempt to harm self or others reported.  Therapist Response: CSW utilized cognitive restructuring to assist pt in  challenging and changing irrational beliefs. CSW processed with pt how beliefs/thoughts can be passed on to others. CSW discussed with pt consequences of harmful core beliefs. CSW discussed importance of creating and maintaining personal boundaries to establish healthy relationships.   Plan: Return again in 2 weeks.  Diagnosis: Axis I: Bipolar I disorder                                     ADHD combined type                                     Work related stress    Axis II: No diagnosis    Morgan Oneal, Morgan Oneal 11/21/2020

## 2020-11-28 ENCOUNTER — Telehealth (HOSPITAL_COMMUNITY): Payer: Federal, State, Local not specified - PPO | Admitting: Psychiatry

## 2020-11-28 ENCOUNTER — Other Ambulatory Visit: Payer: Self-pay

## 2020-12-05 ENCOUNTER — Ambulatory Visit (INDEPENDENT_AMBULATORY_CARE_PROVIDER_SITE_OTHER): Payer: Federal, State, Local not specified - PPO | Admitting: Clinical

## 2020-12-05 ENCOUNTER — Other Ambulatory Visit: Payer: Self-pay

## 2020-12-05 DIAGNOSIS — F902 Attention-deficit hyperactivity disorder, combined type: Secondary | ICD-10-CM | POA: Diagnosis not present

## 2020-12-05 DIAGNOSIS — Z566 Other physical and mental strain related to work: Secondary | ICD-10-CM | POA: Diagnosis not present

## 2020-12-05 DIAGNOSIS — F319 Bipolar disorder, unspecified: Secondary | ICD-10-CM
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
## 2020-12-19 ENCOUNTER — Ambulatory Visit (INDEPENDENT_AMBULATORY_CARE_PROVIDER_SITE_OTHER): Payer: Federal, State, Local not specified - PPO | Admitting: Clinical

## 2020-12-19 DIAGNOSIS — F902 Attention-deficit hyperactivity disorder, combined type: Secondary | ICD-10-CM | POA: Diagnosis not present

## 2020-12-19 DIAGNOSIS — Z566 Other physical and mental strain related to work: Secondary | ICD-10-CM

## 2020-12-19 DIAGNOSIS — F319 Bipolar disorder, unspecified: Secondary | ICD-10-CM | POA: Diagnosis not present

## 2020-12-19 NOTE — Progress Notes (Signed)
° °  THERAPIST PROGRESS NOTE  Session Time: 3pm  Participation Level: Active  Behavioral Response: Alertpleasant  Type of Therapy: Individual Therapy  Treatment Goals addressed: Coping  Interventions: Supportive Virtual Visit via Telephone Note  I connected with Morgan Oneal on 12/19/20 at  3:00 PM EST by telephone and verified that I am speaking with the correct person using two identifiers.  Location: Patient: home Provider: office   I discussed the limitations, risks, security and privacy concerns of performing an evaluation and management service by telephone and the availability of in person appointments. I also discussed with the patient that there may be a patient responsible charge related to this service. The patient expressed understanding and agreed to proceed.   I discussed the assessment and treatment plan with the patient. The patient was provided an opportunity to ask questions and all were answered. The patient agreed with the plan and demonstrated an understanding of the instructions.   The patient was advised to call back or seek an in-person evaluation if the symptoms worsen or if the condition fails to improve as anticipated.  I provided 45 minutes of non-face-to-face time during this encounter.  Summary: Pt presents in a pleasant mood. Pt states school is going well and she is excelling academically. 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 Pt discussed difficulty accepting compliments and acknowledgement from others. Pt says she was nominated for an award at work but felt indifferent. Pt states the same feeling applied to receiving secret santa gift. Pt acknowledges feeling of indifference is used to shield herself from disappointment. Per pt report she is setting a goal of receiving a massage and visiting a friend in [place] 1x per month as a part of her self care routine. Suicidal/Homicidal: Pt denies SI/HI no plan intent or attempt to harm self or  others reported  Therapist Response: Assessed for changes in mood, behavior and daily functioning. Todays session spent discussing mindful self compassion rather than harsh criticism when feeling inadequate or mistakes made. Verbally praised pt for courageous behavior when seeking advice from coworkers about how to handle a situation she was in. Pt appeared to be proud for displaying vulnerability with her coworkers.  Plan: Return again in 2 weeks.  Diagnosis: Axis I: Bipolar I disorder                                     ADHD combined type                                     Work related stress    Axis II: No diagnosis    Morgan Slick, LCSW 12/19/2020

## 2021-01-03 ENCOUNTER — Ambulatory Visit (INDEPENDENT_AMBULATORY_CARE_PROVIDER_SITE_OTHER): Payer: Federal, State, Local not specified - PPO | Admitting: Clinical

## 2021-01-03 ENCOUNTER — Other Ambulatory Visit: Payer: Self-pay

## 2021-01-03 DIAGNOSIS — F902 Attention-deficit hyperactivity disorder, combined type: Secondary | ICD-10-CM | POA: Diagnosis not present

## 2021-01-03 DIAGNOSIS — F319 Bipolar disorder, unspecified: Secondary | ICD-10-CM

## 2021-01-03 NOTE — Progress Notes (Signed)
° °  THERAPIST PROGRESS NOTE  Session Time: 11am  Participation Level: Active  Behavioral Response: Alertpleasant  Type of Therapy: Individual Therapy  Treatment Goals addressed: Coping  Interventions: CBT Virtual Visit via Telephone Note  I connected with Morgan Oneal on 01/03/21 at 11:00 AM EST by telephone and verified that I am speaking with the correct person using two identifiers.  Location: Patient: home Provider: office   I discussed the limitations, risks, security and privacy concerns of performing an evaluation and management service by telephone and the availability of in person appointments. I also discussed with the patient that there may be a patient responsible charge related to this service. The patient expressed understanding and agreed to proceed.   I discussed the assessment and treatment plan with the patient. The patient was provided an opportunity to ask questions and all were answered. The patient agreed with the plan and demonstrated an understanding of the instructions.   The patient was advised to call back or seek an in-person evaluation if the symptoms worsen or if the condition fails to improve as anticipated.  I provided 45 minutes of non-face-to-face time during this encounter.  Summary: Pt presents in a pleasant manner. Pt appeared to be proud of herself as she discussed the ways in which she has been implementing boundaries with her children and mother in the home. According to pt maintaining healthy boundaries is a goal she plans to continue working toward achieving. Pt says she went to visit her friend and enjoyed the free time. Pt reports she plans to make receiving massages and visiting her friend something she does on a monthly basis.  Suicidal/Homicidal: Pt denies SI/HI no plan, intent or attempt to harm self or others reported  Therapist Response: CSW discussed with pt different types of boundary styles. Processed with pt ways to be  intentional and consistent with boundary setting. Discussed with pt consequences of having no boundaries in relationships.  Plan: Return again in 2 weeks.  Diagnosis: Axis I: Bipolar I disorder                                     ADHD combined type    Axis II: No diagnosis    Morgan Rack, LCSW 01/03/2021

## 2021-01-16 ENCOUNTER — Ambulatory Visit (INDEPENDENT_AMBULATORY_CARE_PROVIDER_SITE_OTHER): Payer: Federal, State, Local not specified - PPO | Admitting: Clinical

## 2021-01-16 ENCOUNTER — Other Ambulatory Visit: Payer: Self-pay

## 2021-01-16 DIAGNOSIS — F319 Bipolar disorder, unspecified: Secondary | ICD-10-CM

## 2021-01-16 DIAGNOSIS — Z566 Other physical and mental strain related to work: Secondary | ICD-10-CM | POA: Diagnosis not present

## 2021-01-16 NOTE — Progress Notes (Signed)
° °  THERAPIST PROGRESS NOTE  Session Time: 3pm  Participation Level: Active  Behavioral Response: AlertEuthymic  Type of Therapy: Individual Therapy  Treatment Goals addressed: Coping  Interventions: Supportive Virtual Visit via Telephone Note  I connected with Morgan Oneal on 01/16/21 at  3:00 PM EST by telephone and verified that I am speaking with the correct person using two identifiers.  Location: Patient: home Provider: office   I discussed the limitations, risks, security and privacy concerns of performing an evaluation and management service by telephone and the availability of in person appointments. I also discussed with the patient that there may be a patient responsible charge related to this service. The patient expressed understanding and agreed to proceed.   I discussed the assessment and treatment plan with the patient. The patient was provided an opportunity to ask questions and all were answered. The patient agreed with the plan and demonstrated an understanding of the instructions.   The patient was advised to call back or seek an in-person evaluation if the symptoms worsen or if the condition fails to improve as anticipated.  I provided 50 minutes of non-face-to-face time during this encounter.  Summary: Pt presents in euthymic mood. Pt says she has had some success implementing boundaries in her home with her mother and children. Pt reports having a strained relationship with her mother and says she has discussed with her mother what her expectations are in the home. Additionally, pt states she has had conversation with her teenage daughter about expectations. Pt states she is hopeful everyone in the home can come to an understanding and respect each others boundaries. Pt appeared to be proud of her progress.  Suicidal/Homicidal:  Pt denies SI/HI no plan, intent or attempt to harm self or others reported  Therapist Response: Discussed with pt consequences of not  having boundaries in relationships. Processed with pt knowing her limits and being intentional about implementation of boundaries. Verbally praised pt for her progress toward implementing boundaries in her home. Processed with pt how healthy boundaries can contribute to reduction to stress/anxiety in her life.  Plan: Return again in 2 weeks.  Diagnosis: Axis I: Bipolar I disorder                                     ADHD combined type    Axis II: No diagnosis    Yvette Rack, LCSW 01/16/2021

## 2021-01-30 ENCOUNTER — Other Ambulatory Visit: Payer: Self-pay

## 2021-01-30 ENCOUNTER — Ambulatory Visit (INDEPENDENT_AMBULATORY_CARE_PROVIDER_SITE_OTHER): Payer: Federal, State, Local not specified - PPO | Admitting: Clinical

## 2021-01-30 DIAGNOSIS — F902 Attention-deficit hyperactivity disorder, combined type: Secondary | ICD-10-CM

## 2021-01-30 DIAGNOSIS — F319 Bipolar disorder, unspecified: Secondary | ICD-10-CM

## 2021-01-30 NOTE — Progress Notes (Signed)
° °  THERAPIST PROGRESS NOTE  Session Time: 2pm  Participation Level: Active  Behavioral Response: AlertEuthymic  Type of Therapy: Individual Therapy  Treatment Goals addressed: Coping  Interventions: Assertiveness Training Virtual Visit via Telephone Note  I connected with Morgan Oneal on 01/30/21 at  2:00 PM EST by telephone and verified that I am speaking with the correct person using two identifiers.  Location: Patient: home Provider: office   I discussed the limitations, risks, security and privacy concerns of performing an evaluation and management service by telephone and the availability of in person appointments. I also discussed with the patient that there may be a patient responsible charge related to this service. The patient expressed understanding and agreed to proceed.   I discussed the assessment and treatment plan with the patient. The patient was provided an opportunity to ask questions and all were answered. The patient agreed with the plan and demonstrated an understanding of the instructions.   The patient was advised to call back or seek an in-person evaluation if the symptoms worsen or if the condition fails to improve as anticipated.  I provided 45 minutes of non-face-to-face time during this encounter.  Summary: Pt present in euthymic mood. Pt states being proud of herself for maintaining healthy boundaries in relationships. Pt states she has received "push back" from her grandmother but says she has been firm on her boundaries. Pt states having a "toxic relationship" with her grandmother that she says is also apparent in her grandmothers relationships with other family members.  Suicidal/Homicidal: Pt denies SI/HI no plan, intent or attempt reported.  no AH/VH reported.  Therapist Response: Prompted pt to identify traits of healthy relationships. Utilized assertiveness training to assist pt in standing up for her wants, needs. Discussed expressing thoughts  calmly by practicing pausing and breathing to avoid being reactionary. Processed with pt how core beliefs shape view of self, others and the world and importance of challenging irrational thoughts.  Plan: Return again in 2 weeks.  Diagnosis: Axis I: Bipolar I disorder                                     ADHD combined type    Axis II: No diagnosis    Yvette Rack, LCSW 01/30/2021

## 2021-02-13 ENCOUNTER — Ambulatory Visit (INDEPENDENT_AMBULATORY_CARE_PROVIDER_SITE_OTHER): Payer: Federal, State, Local not specified - PPO | Admitting: Clinical

## 2021-02-13 ENCOUNTER — Other Ambulatory Visit: Payer: Self-pay

## 2021-02-13 DIAGNOSIS — F902 Attention-deficit hyperactivity disorder, combined type: Secondary | ICD-10-CM | POA: Diagnosis not present

## 2021-02-13 DIAGNOSIS — F319 Bipolar disorder, unspecified: Secondary | ICD-10-CM | POA: Diagnosis not present

## 2021-02-13 NOTE — Progress Notes (Signed)
° °  THERAPIST PROGRESS NOTE  Session Time: 3pm  Participation Level: Active  Behavioral Response: Alert  Type of Therapy: Individual Therapy  Treatment Goals addressed: Coping  Interventions: Supportive Virtual Visit via Telephone Note  I connected with Morgan Oneal on 02/13/21 at  3:00 PM EST by telephone and verified that I am speaking with the correct person using two identifiers.  Location: Patient: home Provider: office   I discussed the limitations, risks, security and privacy concerns of performing an evaluation and management service by telephone and the availability of in person appointments. I also discussed with the patient that there may be a patient responsible charge related to this service. The patient expressed understanding and agreed to proceed.   I discussed the assessment and treatment plan with the patient. The patient was provided an opportunity to ask questions and all were answered. The patient agreed with the plan and demonstrated an understanding of the instructions.   The patient was advised to call back or seek an in-person evaluation if the symptoms worsen or if the condition fails to improve as anticipated.  I provided 50 minutes of non-face-to-face time during this encounter.  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 Pt says this has been challenging and still trying to find ways to balance home, work and school. Pt admits to needing to do a better job of priortizing her self care and encourged to start using paid time off more frequently to recharge.  Suicidal/Homicidal: Pt denies SI/HI no plan, intent or attempt reported.  no AH/VH reported  Therapist Response: Discussed cognitive distortion and how it leads to negative thinking. Processed with pt tendency to personalize events that are partially or completely out of her control. Assisted pt in identifying ways to manage negative thinking/cognitive  distortions(fact checking).  Plan: Return again in 2 weeks.  Diagnosis: Axis I:  Bipolar I disorder                                     ADHD combined type    Axis II: No diagnosis    Suzan Slick, LCSW 02/13/2021

## 2021-02-19 ENCOUNTER — Telehealth: Payer: Federal, State, Local not specified - PPO | Admitting: Physician Assistant

## 2021-02-19 DIAGNOSIS — H5711 Ocular pain, right eye: Secondary | ICD-10-CM

## 2021-02-19 DIAGNOSIS — H10021 Other mucopurulent conjunctivitis, right eye: Secondary | ICD-10-CM

## 2021-02-19 DIAGNOSIS — R509 Fever, unspecified: Secondary | ICD-10-CM

## 2021-02-20 ENCOUNTER — Ambulatory Visit: Payer: Federal, State, Local not specified - PPO | Admitting: Family Medicine

## 2021-02-20 ENCOUNTER — Ambulatory Visit: Payer: Federal, State, Local not specified - PPO | Admitting: Podiatry

## 2021-02-20 ENCOUNTER — Other Ambulatory Visit: Payer: Self-pay

## 2021-02-20 ENCOUNTER — Ambulatory Visit: Payer: Self-pay

## 2021-02-20 VITALS — BP 118/78 | Ht 67.0 in | Wt 232.0 lb

## 2021-02-20 DIAGNOSIS — M25561 Pain in right knee: Secondary | ICD-10-CM | POA: Diagnosis not present

## 2021-02-20 DIAGNOSIS — M25512 Pain in left shoulder: Secondary | ICD-10-CM

## 2021-02-20 DIAGNOSIS — G8929 Other chronic pain: Secondary | ICD-10-CM

## 2021-02-20 NOTE — Progress Notes (Signed)
PCP: Pcp, No  Subjective:   HPI: Patient is a 36 y.o. female here for left shoulder and right knee pain.  Left shoulder pain x6-8 months. Initially a "stuck" feeling followed by an audible pop at that time when playing with her kids. Now having issues with reaching above her head, sleeping, and numbness over anterior shoulder. Has pain radiating into her fingers. Denies prior shoulder injury. Has not tried anything for it.   Right knee pain Ongoing for years. Old high school extension injury and pregnancy related weight gain which led to a knee scope in 2015. Most recently MRI followed by PT 2 years ago. She was told at this time her IT band was tight. She is now experiencing swelling after strenuous activity, cracking sound, and feeling as if her knee is going to give out. Denies any known additional injury. She has tried Kinesiotaping for relief.   Past Medical History:  Diagnosis Date   Asthma    Depression     Current Outpatient Medications on File Prior to Visit  Medication Sig Dispense Refill   ARIPiprazole (ABILIFY) 5 MG tablet Take one tab daily 90 tablet 0   aspirin-acetaminophen-caffeine (EXCEDRIN MIGRAINE) 250-250-65 MG tablet Take 1 tablet by mouth every 6 (six) hours as needed for headache.     atomoxetine (STRATTERA) 60 MG capsule Take 1 capsule (60 mg total) by mouth daily. 90 capsule 0   traZODone (DESYREL) 100 MG tablet Take 1 tablet (100 mg total) by mouth at bedtime as needed. for sleep 90 tablet 0   No current facility-administered medications on file prior to visit.    Past Surgical History:  Procedure Laterality Date   PARTIAL HYSTERECTOMY  2015    Allergies  Allergen Reactions   Mucinex [Guaifenesin Er] Swelling   Penicillins Anaphylaxis    BP 118/78    Ht 5\' 7"  (1.702 m)    Wt 232 lb (105.2 kg)    LMP  (LMP Unknown)    BMI 36.34 kg/m   [facility] 02/20/2021  Frequency of aerobic exercise (# of days/week) 0  Average time in  minutes 0  Frequency of strengthening activities (# of days/week) 0    No flowsheet data found.      Objective:  Physical Exam:  Gen: NAD, comfortable in exam room  Left Shoulder: Inspection reveals no obvious deformity, atrophy, or asymmetry. No bruising. No swelling Palpation is normal with no TTP over [facility] joint or bicipital groove b/l. Full ROM in flexion, abduction, internal/external rotation NV intact distally Special Tests:  - Impingement: Positive Hawkins and neers - Supraspinatous: Negative empty can - Infraspinatous/Teres Minor: 5/5 strength with ER - Subscapularis: 5/5 strength with IR - AC Joint: Negative cross arm  Right Knee: - Inspection: no gross deformity b/l. No swelling/effusion, erythema or bruising b/l. Skin intact - Palpation: TTP at lateral joint line - ROM: full active ROM with flexion and extension in knee and hip b/l - Strength: 5/5 strength b/l - Neuro/vasc: NV intact distally b/l - Special Tests: - LIGAMENTS: negative anterior and posterior drawer, negative Lachman's, no MCL or LCL laxity  -- MENISCUS: Positive McMurray's, Thessaly  -- PF JOINT: nml patellar mobility bilaterally. Excessive crunching with flexion and extension  Limited MSK u/s left shoulder: Subscapularis: intact without abnormalities Infraspinatus: intact without abnormalities Supraspinatus: intact without tears, overlying bursitis  Impression: Normal limited ultrasound of left shoulder - no rotator cuff tears.  Assessment & Plan:  1. Chronic left shoulder pain x6-8  months of symptoms of pain and numbness with exam significant impingement syndrome. [place] today unremarkable for rotator cuff tear. Avoid aggravating activities such as overhead motion. Can take tylenol for pain relief. Aleve or ibuprofen if needed. Start physical therapy with transition to home exercises. Follow up in 5-6 weeks. If not improving consider injection and/or nitroglycerin patches.  - [place] COMPLETE JOINT SPACE  STRUCTURE UP LEFT; Future - Ambulatory referral to Physical Therapy  2. Chronic pain of right knee Ongoing for several years. History and exam highly suggestive of lateral meniscal tear with a parameniscal cyst. Reviewed 02/22/2018 MRI read which suspected a meniscal tear. Strength and ROM are preserved. Continue ice. Will start therapy and home exercises when not in therapy. Follow up as above. Consider need for another knee scope if fails to improve.  - Ambulatory referral to Physical Therapy

## 2021-02-20 NOTE — Patient Instructions (Signed)
You have a lateral meniscus tear with a parameniscal cyst. Start physical therapy and do home exercises on days you don't go to therapy. Icing 15 minutes at a time as needed. I'm a little concerned you're still dealing with this but hopefully with therapy specifically for the meniscus tear we can get you better without needing your knee scoped again.  You have rotator cuff impingement Try to avoid painful activities (overhead activities, lifting with extended arm) as much as possible. Aleve 2 tabs twice a day with food OR ibuprofen 3 tabs three times a day with food for pain and inflammation as needed. Can take tylenol in addition to this. Subacromial injection may be beneficial to help with pain and to decrease inflammation. Start physical therapy with transition to home exercise program. Do home exercise program with theraband and scapular stabilization exercises daily 3 sets of 10 once a day. If not improving at follow-up we will consider injection, physical therapy, and/or nitro patches. Follow up with me in 5-6 weeks.

## 2021-02-20 NOTE — Progress Notes (Signed)
Based on what you shared with me, I feel your condition warrants further evaluation and I recommend that you be seen in a face to face visit.  With such a high fever, a red eye, and severe eye pain you should be evaluated in person ASAP. This could be an eye emergency and needs to be accurately and thoroughly evaluated. I would recommend if you have an eye doctor to contact them first. If they are unable to see you, or if you do not have one, then please be seen ASAP at either your local UC or ER.    NOTE: There will be NO CHARGE for this eVisit   If you are having a true medical emergency please call 911.      For an urgent face to face visit, [facility] has six urgent care centers for your convenience:     [facility] [place] [facility] [place] Get Driving Directions [id number] [place], [place] [address]    [facility]) Get Driving Directions [id number] [place], [place] [address]  [facility] [place] [facility]) Get Driving Directions [id number] [place],  [place]  [address]  [facility] Get Driving Directions [id number] [place], [place], [place] [address]   [facility] Get Driving Directions  [id number] [address] [place], [place] [address]    [facility] Get Driving Directions [id number] [address], [place], [place] [address]  Your MyChart E-visit questionnaire answers were reviewed by a board certified advanced clinical practitioner to complete your personal care plan based on your specific symptoms.  Thank you for using e-Visits.   I provided 5 minutes of non face-to-face time during this encounter for chart review and documentation.

## 2021-02-23 ENCOUNTER — Ambulatory Visit: Payer: Federal, State, Local not specified - PPO | Admitting: Nurse Practitioner

## 2021-02-27 ENCOUNTER — Ambulatory Visit (HOSPITAL_COMMUNITY): Payer: Federal, State, Local not specified - PPO | Admitting: Clinical

## 2021-03-01 ENCOUNTER — Ambulatory Visit: Payer: Federal, State, Local not specified - PPO | Admitting: Podiatry

## 2021-03-03 ENCOUNTER — Telehealth: Payer: Federal, State, Local not specified - PPO | Admitting: Physician Assistant

## 2021-03-03 DIAGNOSIS — R6889 Other general symptoms and signs: Secondary | ICD-10-CM

## 2021-03-03 MED ORDER — OSELTAMIVIR PHOSPHATE 75 MG PO CAPS: 75.0000 mg | ORAL_CAPSULE | Freq: Two times a day (BID) | ORAL | 0 refills | Status: DC

## 2021-03-03 NOTE — Patient Instructions (Signed)
?[name], thank you for joining Piedad Climes, PA-C for today's virtual visit.  While this provider is not your primary care provider (PCP), if your PCP is located in our provider database this encounter information will be shared with them immediately following your visit. ? ?Consent: ?(Patient) Morgan Oneal provided verbal consent for this virtual visit at the beginning of the encounter. ? ?Current Medications: ? ?Current Outpatient Medications:  ?  ARIPiprazole (ABILIFY) 5 MG tablet, Take one tab daily, Disp: 90 tablet, Rfl: 0 ?  aspirin-acetaminophen-caffeine (EXCEDRIN MIGRAINE) 250-250-65 MG tablet, Take 1 tablet by mouth every 6 (six) hours as needed for headache., Disp: , Rfl:  ?  atomoxetine (STRATTERA) 60 MG capsule, Take 1 capsule (60 mg total) by mouth daily., Disp: 90 capsule, Rfl: 0 ?  traZODone (DESYREL) 100 MG tablet, Take 1 tablet (100 mg total) by mouth at bedtime as needed. for sleep, Disp: 90 tablet, Rfl: 0  ? ?Medications ordered in this encounter:  ?No orders of the defined types were placed in this encounter. ?  ? ?*If you need refills on other medications prior to your next appointment, please contact your pharmacy* ? ?Follow-Up: ?Call back or seek an in-person evaluation if the symptoms worsen or if the condition fails to improve as anticipated. ? ?Other Instructions ?Please keep well-hydrated and get plenty of rest.  ?Take the Tamiflu as directed. ?You can use Tylenol or Ibuprofen for headaches or body aches, or can use OTC Theralfu to treat multiple symptoms.  ? ?If anything worsens despite treatment, you need to be evaluated in person.  ? ?Influenza, Adult ?Influenza is also called "the flu." It is an infection in the lungs, nose, and throat (respiratory tract). It spreads easily from person to person (is contagious). The flu causes symptoms that are like a cold, along with high fever and body aches. ?What are the causes? ?This condition is caused by the influenza  virus. You can get the virus by: ?Breathing in droplets that are in the air after a person infected with the flu coughed or sneezed. ?Touching something that has the virus on it and then touching your mouth, nose, or eyes. ?What increases the risk? ?Certain things may make you more likely to get the flu. These include: ?Not washing your hands often. ?Having close contact with many people during cold and flu season. ?Touching your mouth, eyes, or nose without first washing your hands. ?Not getting a flu shot every year. ?You may have a higher risk for the flu, and serious problems, such as a lung infection (pneumonia), if you: ?Are older than 65. ?Are pregnant. ?Have a weakened disease-fighting system (immune system) because of a disease or because you are taking certain medicines. ?Have a long-term (chronic) condition, such as: ?Heart, kidney, or lung disease. ?Diabetes. ?Asthma. ?Have a liver disorder. ?Are very overweight (morbidly obese). ?Have anemia. ?What are the signs or symptoms? ?Symptoms usually begin suddenly and last 4-14 days. They may include: ?Fever and chills. ?Headaches, body aches, or muscle aches. ?Sore throat. ?Cough. ?Runny or stuffy (congested) nose. ?Feeling discomfort in your chest. ?Not wanting to eat as much as normal. ?Feeling weak or tired. ?Feeling dizzy. ?Feeling sick to your stomach or throwing up. ?How is this treated? ?If the flu is found early, you can be treated with antiviral medicine. This can help to reduce how bad the illness is and how long it lasts. This may be given by mouth or through an IV tube. ?Taking care of  yourself at home can help your symptoms get better. Your doctor may want you to: ?Take over-the-counter medicines. ?Drink plenty of fluids. ?The flu often goes away on its own. If you have very bad symptoms or other problems, you may be treated in a hospital. ?Follow these instructions at home: ?  ?Activity ?Rest as needed. Get plenty of sleep. ?Stay home from work  or school as told by your doctor. ?Do not leave home until you do not have a fever for 24 hours without taking medicine. ?Leave home only to go to your doctor. ?Eating and drinking ?Take an ORS (oral rehydration solution). This is a drink that is sold at pharmacies and stores. ?Drink enough fluid to keep your pee pale yellow. ?Drink clear fluids in small amounts as you are able. Clear fluids include: ?Water. ?Ice chips. ?Fruit juice mixed with water. ?Low-calorie sports drinks. ?Eat bland foods that are easy to digest. Eat small amounts as you are able. These foods include: ?Bananas. ?Applesauce. ?Rice. ?Lean meats. ?Toast. ?Crackers. ?Do not eat or drink: ?Fluids that have a lot of sugar or caffeine. ?Alcohol. ?Spicy or fatty foods. ?General instructions ?Take over-the-counter and prescription medicines only as told by your doctor. ?Use a cool mist humidifier to add moisture to the air in your home. This can make it easier for you to breathe. ?When using a cool mist humidifier, clean it daily. Empty water and replace with clean water. ?Cover your mouth and nose when you cough or sneeze. ?Wash your hands with soap and water often and for at least 20 seconds. This is also important after you cough or sneeze. If you cannot use soap and water, use alcohol-based hand sanitizer. ?Keep all follow-up visits. ?How is this prevented? ? ?Get a flu shot every year. You may get the flu shot in late summer, fall, or winter. Ask your doctor when you should get your flu shot. ?Avoid contact with people who are sick during fall and winter. This is cold and flu season. ?Contact a doctor if: ?You get new symptoms. ?You have: ?Chest pain. ?Watery poop (diarrhea). ?A fever. ?Your cough gets worse. ?You start to have more mucus. ?You feel sick to your stomach. ?You throw up. ?Get help right away if you: ?Have shortness of breath. ?Have trouble breathing. ?Have skin or nails that turn a bluish color. ?Have very bad pain or stiffness in  your neck. ?Get a sudden headache. ?Get sudden pain in your face or ear. ?Cannot eat or drink without throwing up. ?These symptoms may represent a serious problem that is an emergency. Get medical help right away. Call your local emergency services (911 in the [place] ?Do not wait to see if the symptoms will go away. ?Do not drive yourself to the hospital. ?Summary ?Influenza is also called "the flu." It is an infection in the lungs, nose, and throat. It spreads easily from person to person. ?Take over-the-counter and prescription medicines only as told by your doctor. ?Getting a flu shot every year is the best way to not get the flu. ?This information is not intended to replace advice given to you by your health care provider. Make sure you discuss any questions you have with your health care provider. ?Document Revised: 08/07/2019 Document Reviewed: 08/07/2019 ?Elsevier Patient Education ? 2022 [employer] ? ? ? ? ?If you have been instructed to have an in-person evaluation today at a local Urgent Care facility, please use the link below. It will take you to a list  of all of our available [facility], including address, phone number and hours of operation. Please do not delay care.  ?[facility] ? ?If you or a family member do not have a primary care provider, use the link below to schedule a visit and establish care. When you choose a [facility] primary care physician or advanced practice provider, you gain a long-term partner in health. ?Find a Primary Care Provider ? ?Learn more about [facility]'s in-office and virtual care options: ? - Get Care Now  ?

## 2021-03-03 NOTE — Progress Notes (Signed)
?Virtual Visit Consent  ? ?[name], you are scheduled for a virtual visit with a [facility] provider today.   ?  ?Just as with appointments in the office, your consent must be obtained to participate.  Your consent will be active for this visit and any virtual visit you may have with one of our providers in the next 365 days.   ?  ?If you have a MyChart account, a copy of this consent can be sent to you electronically.  All virtual visits are billed to your insurance company just like a traditional visit in the office.   ? ?As this is a virtual visit, video technology does not allow for your provider to perform a traditional examination.  This may limit your provider's ability to fully assess your condition.  If your provider identifies any concerns that need to be evaluated in person or the need to arrange testing (such as labs, EKG, etc.), we will make arrangements to do so.   ?  ?Although advances in technology are sophisticated, we cannot ensure that it will always work on either your end or our end.  If the connection with a video visit is poor, the visit may have to be switched to a telephone visit.  With either a video or telephone visit, we are not always able to ensure that we have a secure connection.    ? ?I need to obtain your verbal consent now.   Are you willing to proceed with your visit today?  ?  ?CHRYSTLE MURILLO has provided verbal consent on 03/03/2021 for a virtual visit (video or telephone). ?  ?Piedad Climes, PA-C  ? ?Date: 03/03/2021 2:37 PM ? ? ?Virtual Visit via Video Note  ? ?IPiedad Climes, connected with  LINDZEY ZENT  ([id number], 1985-12-14) on 03/03/21 at  2:30 PM EST by a video-enabled telemedicine application and verified that I am speaking with the correct person using two identifiers. ? ?Location: ?Patient: Virtual Visit Location Patient: Home ?Provider: Virtual Visit Location Provider: Home Office ?  ?I discussed the limitations of evaluation and management  by telemedicine and the availability of in person appointments. The patient expressed understanding and agreed to proceed.   ? ?History of Present Illness: ?Morgan Oneal is a 36 y.o. who identifies as a female who was assigned female at birth, and is being seen today for significant URI symptoms starting suddenly last night. Notes pretty quickly having significant body aches, fatigue, nasal congestion, sore throat. Also noting headache. Temperature last night was 100 with tylenol.Denies recent travel or sick contact. .Denies chest pain or SOB. Took a home COVID test which was negative.  ? ?HPI: HPI  ?Problems:  ?Patient Active Problem List  ? Diagnosis Date Noted  ? Depression, recurrent (HCC) 08/11/2018  ? Raynaud's disease, idiopathic 07/24/2017  ? History of abnormal Pap smear 04/23/2012  ?  ?Allergies:  ?Allergies  ?Allergen Reactions  ? Mucinex [Guaifenesin Er] Swelling  ? Penicillins Anaphylaxis  ? ?Medications:  ?Current Outpatient Medications:  ?  oseltamivir (TAMIFLU) 75 MG capsule, Take 1 capsule (75 mg total) by mouth 2 (two) times daily., Disp: 10 capsule, Rfl: 0 ?  ARIPiprazole (ABILIFY) 5 MG tablet, Take one tab daily, Disp: 90 tablet, Rfl: 0 ?  aspirin-acetaminophen-caffeine (EXCEDRIN MIGRAINE) 250-250-65 MG tablet, Take 1 tablet by mouth every 6 (six) hours as needed for headache., Disp: , Rfl:  ?  atomoxetine (STRATTERA) 60 MG capsule, Take 1 capsule (60 mg total)  by mouth daily., Disp: 90 capsule, Rfl: 0 ?  traZODone (DESYREL) 100 MG tablet, Take 1 tablet (100 mg total) by mouth at bedtime as needed. for sleep, Disp: 90 tablet, Rfl: 0 ? ?Observations/Objective: ?Patient is well-developed, well-nourished in no acute distress.  ?Resting comfortably in bed at home.  ?Head is normocephalic, atraumatic.  ?No labored breathing. ?Speech is clear and coherent with logical content.  ?Patient is alert and oriented at baseline.  ? ?Assessment and Plan: ?1. Flu-like symptoms ?- oseltamivir (TAMIFLU) 75 MG  capsule; Take 1 capsule (75 mg total) by mouth 2 (two) times daily.  Dispense: 10 capsule; Refill: 0 ? ?Classic symptoms. Negative COVID. Supportive measures, OTC medications and vitamin recommendations reviewed. Rx Tamiflu BID x 5 days. Work note provided. Strict UC/ER precautions reviewed with patient.  ? ?Follow Up Instructions: ?I discussed the assessment and treatment plan with the patient. The patient was provided an opportunity to ask questions and all were answered. The patient agreed with the plan and demonstrated an understanding of the instructions.  A copy of instructions were sent to the patient via MyChart unless otherwise noted below.  ? ?The patient was advised to call back or seek an in-person evaluation if the symptoms worsen or if the condition fails to improve as anticipated. ? ?Time:  ?I spent 10 minutes with the patient via telehealth technology discussing the above problems/concerns.   ? ?Piedad Climes, PA-C ?

## 2021-03-07 ENCOUNTER — Ambulatory Visit
Admission: EM | Admit: 2021-03-07 | Discharge: 2021-03-07 | Disposition: A | Discharge status: Home / Self Care | Payer: Federal, State, Local not specified - PPO | Attending: Emergency Medicine | Admitting: Emergency Medicine

## 2021-03-07 ENCOUNTER — Other Ambulatory Visit: Payer: Self-pay

## 2021-03-07 ENCOUNTER — Encounter: Payer: Self-pay | Admitting: Emergency Medicine

## 2021-03-07 DIAGNOSIS — J029 Acute pharyngitis, unspecified: Secondary | ICD-10-CM | POA: Diagnosis not present

## 2021-03-07 DIAGNOSIS — J358 Other chronic diseases of tonsils and adenoids: Secondary | ICD-10-CM | POA: Diagnosis not present

## 2021-03-07 DIAGNOSIS — J351 Hypertrophy of tonsils: Secondary | ICD-10-CM | POA: Diagnosis not present

## 2021-03-07 LAB — POCT RAPID STREP A (OFFICE): Rapid Strep A Screen: NEGATIVE

## 2021-03-07 MED ORDER — LIDOCAINE VISCOUS HCL 2 % MT SOLN: 15.0000 mL | OROMUCOSAL | 0 refills | Status: DC | PRN

## 2021-03-07 NOTE — ED Triage Notes (Signed)
Thursday had body aches and feeling bad. Reports did tele visit on Friday and prescribed Tamiflu for flu like symptoms. Still having sore throat, body aches, ears aching, sweating esp when moving around the house.  ?

## 2021-03-07 NOTE — ED Provider Notes (Signed)
[facility]    CSN: [id number] Arrival date & time: 03/07/21  [id number]    HISTORY   Chief Complaint  Patient presents with   Generalized Body Aches   HPI Morgan Oneal is a 36 y.o. female. Thursday had body aches and feeling bad. Reports did tele visit on Friday and prescribed Tamiflu for flu like symptoms. Still having sore throat, body aches, ears aching, sweating esp when moving around the house.  Patient states he taken 2 home COVID test, 1 on day 1 of symptoms and one of the second of symptoms.  Rapid strep test today is negative.  Patient denies loss of taste or smell, sore throat, nausea, vomiting, diarrhea.  The history is provided by the patient.  Past Medical History:  Diagnosis Date   Asthma    Depression    Patient Active Problem List   Diagnosis Date Noted   Depression, recurrent ([place]) 08/11/2018   Raynaud's disease, idiopathic 07/24/2017   History of abnormal Pap smear 04/23/2012   Past Surgical History:  Procedure Laterality Date   PARTIAL HYSTERECTOMY  2015   OB History   No obstetric history on file.    Home Medications    Prior to Admission medications   Medication Sig Start Date End Date Taking? Authorizing Provider  ARIPiprazole (ABILIFY) 5 MG tablet Take one tab daily 06/30/20   Arfeen, Arlyce Harman, MD  aspirin-acetaminophen-caffeine (EXCEDRIN MIGRAINE) [phone] MG tablet Take 1 tablet by mouth every 6 (six) hours as needed for headache.    [provider]  atomoxetine (STRATTERA) 60 MG capsule Take 1 capsule (60 mg total) by mouth daily. 06/30/20   Arfeen, Arlyce Harman, MD  oseltamivir (TAMIFLU) 75 MG capsule Take 1 capsule (75 mg total) by mouth 2 (two) times daily. 03/03/21   Brunetta Jeans, PA-C  traZODone (DESYREL) 100 MG tablet Take 1 tablet (100 mg total) by mouth at bedtime as needed. for sleep 06/30/20   Arfeen, Arlyce Harman, MD   Family History Family History  Problem Relation Age of Onset   Arthritis Father    Arthritis Maternal  Grandmother    Diabetes Maternal Grandmother    Arthritis Paternal Grandmother    Asthma Paternal Grandmother    Cancer Paternal Grandmother    Alcohol abuse Paternal Grandfather    Social History Social History   Tobacco Use   Smoking status: Never   Smokeless tobacco: Never  Substance Use Topics   Alcohol use: Yes    Comment: maybe once/week    Drug use: Never   Allergies   Mucinex [guaifenesin er] and Penicillins  Review of Systems Review of Systems Pertinent findings noted in history of present illness.   Physical Exam Triage Vital Signs ED Triage Vitals  Enc Vitals Group     BP 10/28/20 0827 (!) 147/82     Pulse Rate 10/28/20 0827 72     Resp 10/28/20 0827 18     Temp 10/28/20 0827 98.3 F (36.8 C)     Temp Source 10/28/20 0827 Oral     SpO2 10/28/20 0827 98 %     Weight --      Height --      Head Circumference --      Peak Flow --      Pain Score 10/28/20 0826 5     Pain Loc --      Pain Edu? --      Excl. in [place]? --   No data found.  Updated  Vital Signs BP 107/75 (BP Location: Right Arm)    Pulse 78    Temp 99 F (37.2 C) (Oral)    Resp 18    LMP  (LMP Unknown)    SpO2 97%   Physical Exam Vitals and nursing note reviewed.  Constitutional:      General: She is not in acute distress.    Appearance: Normal appearance. She is well-developed. She is ill-appearing. She is not toxic-appearing.  HENT:     Head: Normocephalic and atraumatic.     Salivary Glands: Right salivary gland is diffusely enlarged and tender. Left salivary gland is diffusely enlarged and tender.     Right Ear: Hearing, tympanic membrane, ear canal and external ear normal.     Left Ear: Hearing, tympanic membrane, ear canal and external ear normal.     Ears:     Comments: Bilateral EACs with mild erythema, bilateral TMs are normal    Nose: No mucosal edema, congestion or rhinorrhea.     Right Turbinates: Not enlarged, swollen or pale.     Left Turbinates: Not enlarged or swollen.      Right Sinus: No maxillary sinus tenderness or frontal sinus tenderness.     Left Sinus: No maxillary sinus tenderness or frontal sinus tenderness.     Mouth/Throat:     Lips: Pink. No lesions.     Mouth: Mucous membranes are moist. No oral lesions or angioedema.     Dentition: No gingival swelling.     Tongue: No lesions.     Palate: No mass.     Pharynx: Uvula midline. Pharyngeal swelling, oropharyngeal exudate and posterior oropharyngeal erythema present. No uvula swelling.     Tonsils: Tonsillar exudate present. 2+ on the right. 2+ on the left.  Eyes:     General: Lids are normal.        Right eye: No discharge.        Left eye: No discharge.     Extraocular Movements: Extraocular movements intact.     Conjunctiva/sclera: Conjunctivae normal.     Right eye: Right conjunctiva is not injected.     Left eye: Left conjunctiva is not injected.     Pupils: Pupils are equal, round, and reactive to light.  Neck:     Thyroid: No thyroid mass, thyromegaly or thyroid tenderness.     Trachea: Phonation normal. Tracheal tenderness present. No abnormal tracheal secretions or tracheal deviation.     Comments: Voice is muffled Cardiovascular:     Rate and Rhythm: Normal rate and regular rhythm.     Pulses: Normal pulses.     Heart sounds: Normal heart sounds, S1 normal and S2 normal. No murmur heard.   No friction rub. No gallop.  Pulmonary:     Effort: Pulmonary effort is normal. No accessory muscle usage, prolonged expiration, respiratory distress or retractions.     Breath sounds: Normal breath sounds. No stridor, decreased air movement or transmitted upper airway sounds. No decreased breath sounds, wheezing, rhonchi or rales.     Comments: Turbulent breath sounds throughout without wheeze, rale, rhonchi. Chest:     Chest wall: No tenderness.  Abdominal:     General: Abdomen is flat. Bowel sounds are normal.     Palpations: Abdomen is soft.     Tenderness: There is generalized abdominal  tenderness. There is no right CVA tenderness, left CVA tenderness or rebound. Negative signs include Murphy's sign.     Hernia: No hernia is present.  Musculoskeletal:  General: No tenderness. Normal range of motion.     Cervical back: Full passive range of motion without pain, normal range of motion and neck supple. Normal range of motion.     Right lower leg: No edema.     Left lower leg: No edema.  Lymphadenopathy:     Cervical: Cervical adenopathy present.     Right cervical: Superficial cervical adenopathy and posterior cervical adenopathy present.     Left cervical: Superficial cervical adenopathy and posterior cervical adenopathy present.  Skin:    General: Skin is warm and dry.     Findings: No erythema, lesion or rash.  Neurological:     General: No focal deficit present.     Mental Status: She is alert and oriented to person, place, and time. Mental status is at baseline.     Motor: Motor function is intact.     Coordination: Coordination is intact.     Gait: Gait is intact.     Deep Tendon Reflexes: Reflexes are normal and symmetric.  Psychiatric:        Attention and Perception: Attention and perception normal.        Mood and Affect: Mood and affect normal.        Speech: Speech normal.        Behavior: Behavior normal. Behavior is cooperative.        Thought Content: Thought content normal.        Judgment: Judgment normal.    Visual Acuity Right Eye Distance:   Left Eye Distance:   Bilateral Distance:    Right Eye Near:   Left Eye Near:    Bilateral Near:     [facility] Couse / Diagnostics / Procedures:    EKG  Radiology No results found.  Procedures Procedures (including critical care time)  UC Diagnoses / Final Clinical Impressions(s)   I have reviewed the triage vital signs and the nursing notes.  Pertinent labs & imaging results that were available during my care of the patient were reviewed by me and considered in my medical decision making (see  chart for details).   Final diagnoses:  Enlarged tonsils  Tonsillar exudate  Acute pharyngitis, unspecified etiology   Rapid strep test is negative, throat culture will be performed per protocol.  I have also ordered repeat COVID test and flu test as well.  Patient advised that we will notify her of the results once received, antiviral treatment is in the longer indicated given duration of symptoms but if throat culture is positive we will provide patient with antibiotics.  Xylocaine provided for pain relief.  Conservative care recommended.  ED Prescriptions     Medication Sig Dispense Auth. Provider   lidocaine (XYLOCAINE) 2 % solution Use as directed 15 mLs in the mouth or throat every 3 (three) hours as needed for mouth pain (Sore throat). 300 mL Theadora Rama Scales, PA-C      PDMP not reviewed this encounter.  Pending results:  Labs Reviewed  POCT RAPID STREP A (OFFICE) - Normal  CULTURE, GROUP A STREP ([facility])  COVID-19, FLU A+B NAA    Medications Ordered in UC: Medications - No data to display  Disposition Upon Discharge:  Condition: stable for discharge home Home: take medications as prescribed; routine discharge instructions as discussed; follow up as advised.  Patient presented with an acute illness with associated systemic symptoms and significant discomfort requiring urgent management. In my opinion, this is a condition that a prudent lay person (someone who possesses  an average knowledge of health and medicine) may potentially expect to result in complications if not addressed urgently such as respiratory distress, impairment of bodily function or dysfunction of bodily organs.   Routine symptom specific, illness specific and/or disease specific instructions were discussed with the patient and/or caregiver at length.   As such, the patient has been evaluated and assessed, work-up was performed and treatment was provided in alignment with urgent care protocols and  evidence based medicine.  Patient/parent/caregiver has been advised that the patient may require follow up for further testing and treatment if the symptoms continue in spite of treatment, as clinically indicated and appropriate.  If the patient was tested for COVID-19, Influenza and/or RSV, then the patient/parent/guardian was advised to isolate at home pending the results of his/her diagnostic coronavirus test and potentially longer if theyre positive. I have also advised pt that if his/her COVID-19 test returns positive, it's recommended to self-isolate for at least 10 days after symptoms first appeared AND until fever-free for 24 hours without fever reducer AND other symptoms have improved or resolved. Discussed self-isolation recommendations as well as instructions for household member/close contacts as per the [facility] and [place] DHHS, and also gave patient the [place] packet with this information.  Patient/parent/caregiver has been advised to return to the [facility] or PCP in 3-5 days if no better; to PCP or the Emergency Department if new signs and symptoms develop, or if the current signs or symptoms continue to change or worsen for further workup, evaluation and treatment as clinically indicated and appropriate  The patient will follow up with their current PCP if and as advised. If the patient does not currently have a PCP we will assist them in obtaining one.   The patient may need specialty follow up if the symptoms continue, in spite of conservative treatment and management, for further workup, evaluation, consultation and treatment as clinically indicated and appropriate.  Patient/parent/caregiver verbalized understanding and agreement of plan as discussed.  All questions were addressed during visit.  Please see discharge instructions below for further details of plan.  Discharge Instructions:   Discharge Instructions      Your symptoms and physical exam findings are concerning for a viral  respiratory infection.  Based on my physical exam findings, I believe you are suffering from You were tested for both COVID and influenza today because here in the urgent care setting, we do not have an available option for an individual influenza test.  I apologize for the redundancy if you have already taken a COVID test at home.  The result of your viral testing will be posted to your MyChart once it is complete, this typically takes 24 to 48 hours.  If there is a positive result, you will be contacted by phone with further recommendations, if any.    Your strep test today is negative.  Throat culture will be performed per our protocol.  The result of your throat culture will be posted to your MyChart once it is complete, this typically takes 3 to 5 days.  If there is a positive result, you will be contacted by phone and antibiotics will be prescribed for you.   Please see the list below for recommended medications, dosages and frequencies to provide relief of your current symptoms:     Albuterol HFA: This is a bronchodilator, it relaxes the smooth muscles that constrict your airway in your lungs when you are feeling sick or having inflammation secondary to allergies or upper respiratory infection.  Please inhale 2 puffs twice daily every day using the spacer provided.  You can also inhale 2 more puffs as often as needed throughout the day for aggravating cough, chest tightness, feeling short of breath, wheezing.      Ibuprofen  (Advil, Motrin): This is a good anti-inflammatory medication which addresses aches, pains and inflammation of the upper airways that causes sinus and nasal congestion as well as in the lower airways which makes your cough feel tight and sometimes burn.  I recommend that you take between 400 to 600 mg every 6-8 hours as needed.      Lidocaine (Xylocaine): This is a numbing medication that can be swished for 15 seconds and swallowed.  You can use this every 3 hours while awake to  relieve pain in your mouth and throat.  I have sent a prescription for this medication to your pharmacy.   Conservative care is also recommended at this time.  This includes rest, pushing clear fluids and activity as tolerated.  Warm beverages such as teas and broths versus cold beverages/popsicles and frozen sherbet/sorbet are your choice, both warm and cold are beneficial.  You may also notice that your appetite is reduced; this is okay as long as you are drinking plenty of clear fluids.    Please follow-up within the next 3 to 5 days either with your primary care provider or urgent care if your symptoms do not resolve.  If you do not have a primary care provider, we will assist you in finding one.   Thank you for visiting urgent care today.  We appreciate the opportunity to participate in your care.     This office note has been dictated using Museum/gallery curator.  Unfortunately, and despite my best efforts, this method of dictation can sometimes lead to occasional typographical or grammatical errors.  I apologize in advance if this occurs.     Lynden Oxford Scales, PA-C 03/07/21 1544

## 2021-03-07 NOTE — Discharge Instructions (Addendum)
Your symptoms and physical exam findings are concerning for a viral respiratory infection.  Based on my physical exam findings, I believe you are suffering from ?You were tested for both COVID and influenza today because here in the urgent care setting, we do not have an available option for an individual influenza test.  I apologize for the redundancy if you have already taken a COVID test at home.  The result of your viral testing will be posted to your MyChart once it is complete, this typically takes 24 to 48 hours.  If there is a positive result, you will be contacted by phone with further recommendations, if any.  ?  ?Your strep test today is negative.  Throat culture will be performed per our protocol.  The result of your throat culture will be posted to your MyChart once it is complete, this typically takes 3 to 5 days.  If there is a positive result, you will be contacted by phone and antibiotics will be prescribed for you. ?  ?Please see the list below for recommended medications, dosages and frequencies to provide relief of your current symptoms:   ?  ?Albuterol HFA: This is a bronchodilator, it relaxes the smooth muscles that constrict your airway in your lungs when you are feeling sick or having inflammation secondary to allergies or upper respiratory infection.  Please inhale 2 puffs twice daily every day using the spacer provided.  You can also inhale 2 more puffs as often as needed throughout the day for aggravating cough, chest tightness, feeling short of breath, wheezing.    ?  ?Ibuprofen  (Advil, Motrin): This is a good anti-inflammatory medication which addresses aches, pains and inflammation of the upper airways that causes sinus and nasal congestion as well as in the lower airways which makes your cough feel tight and sometimes burn.  I recommend that you take between 400 to 600 mg every 6-8 hours as needed.    ?  ?Lidocaine (Xylocaine): This is a numbing medication that can be swished for 15  seconds and swallowed.  You can use this every 3 hours while awake to relieve pain in your mouth and throat.  I have sent a prescription for this medication to your pharmacy. ?  ?Conservative care is also recommended at this time.  This includes rest, pushing clear fluids and activity as tolerated.  Warm beverages such as teas and broths versus cold beverages/popsicles and frozen sherbet/sorbet are your choice, both warm and cold are beneficial.  You may also notice that your appetite is reduced; this is okay as long as you are drinking plenty of clear fluids.  ?  ?Please follow-up within the next 3 to 5 days either with your primary care provider or urgent care if your symptoms do not resolve.  If you do not have a primary care provider, we will assist you in finding one. ?  ?Thank you for visiting urgent care today.  We appreciate the opportunity to participate in your care. ? ?

## 2021-03-08 ENCOUNTER — Ambulatory Visit: Payer: Federal, State, Local not specified - PPO | Admitting: Podiatry

## 2021-03-09 ENCOUNTER — Telehealth (HOSPITAL_COMMUNITY): Payer: Self-pay | Admitting: Emergency Medicine

## 2021-03-09 LAB — COVID-19, FLU A+B NAA
Influenza A, NAA: NOT DETECTED
Influenza B, NAA: NOT DETECTED
SARS-CoV-2, NAA: NOT DETECTED

## 2021-03-09 LAB — CULTURE, GROUP A STREP (THRC)

## 2021-03-09 MED ORDER — AZITHROMYCIN 250 MG PO TABS: 250.0000 mg | ORAL_TABLET | Freq: Every day | ORAL | 0 refills | Status: DC

## 2021-03-14 ENCOUNTER — Ambulatory Visit (HOSPITAL_COMMUNITY): Payer: Federal, State, Local not specified - PPO | Admitting: Clinical

## 2021-03-14 ENCOUNTER — Other Ambulatory Visit: Payer: Self-pay

## 2021-03-17 ENCOUNTER — Encounter: Payer: Self-pay | Admitting: Nurse Practitioner

## 2021-03-17 ENCOUNTER — Ambulatory Visit: Payer: Federal, State, Local not specified - PPO | Admitting: Nurse Practitioner

## 2021-03-17 ENCOUNTER — Other Ambulatory Visit: Payer: Self-pay

## 2021-03-17 VITALS — BP 120/72 | HR 97 | Temp 98.2°F | Resp 18 | Ht 67.0 in | Wt 236.4 lb

## 2021-03-17 DIAGNOSIS — F909 Attention-deficit hyperactivity disorder, unspecified type: Secondary | ICD-10-CM

## 2021-03-17 DIAGNOSIS — J4520 Mild intermittent asthma, uncomplicated: Secondary | ICD-10-CM

## 2021-03-17 DIAGNOSIS — Z130 Encounter for screening for diseases of the blood and blood-forming organs and certain disorders involving the immune mechanism: Secondary | ICD-10-CM | POA: Diagnosis not present

## 2021-03-17 DIAGNOSIS — Z79899 Other long term (current) drug therapy: Secondary | ICD-10-CM | POA: Diagnosis not present

## 2021-03-17 DIAGNOSIS — Z7689 Persons encountering health services in other specified circumstances: Secondary | ICD-10-CM

## 2021-03-17 DIAGNOSIS — F319 Bipolar disorder, unspecified: Secondary | ICD-10-CM

## 2021-03-17 DIAGNOSIS — G4709 Other insomnia: Secondary | ICD-10-CM

## 2021-03-17 DIAGNOSIS — F339 Major depressive disorder, recurrent, unspecified: Secondary | ICD-10-CM

## 2021-03-17 DIAGNOSIS — E785 Hyperlipidemia, unspecified: Secondary | ICD-10-CM

## 2021-03-17 DIAGNOSIS — Z131 Encounter for screening for diabetes mellitus: Secondary | ICD-10-CM

## 2021-03-17 DIAGNOSIS — K219 Gastro-esophageal reflux disease without esophagitis: Secondary | ICD-10-CM

## 2021-03-17 DIAGNOSIS — Z111 Encounter for screening for respiratory tuberculosis: Secondary | ICD-10-CM

## 2021-03-17 DIAGNOSIS — Z0000 Encounter for general adult medical examination without abnormal findings: Secondary | ICD-10-CM | POA: Diagnosis not present

## 2021-03-17 DIAGNOSIS — E559 Vitamin D deficiency, unspecified: Secondary | ICD-10-CM

## 2021-03-17 MED ORDER — ALBUTEROL SULFATE HFA 108 (90 BASE) MCG/ACT IN AERS: 2.0000 | INHALATION_SPRAY | Freq: Four times a day (QID) | RESPIRATORY_TRACT | 3 refills | Status: AC | PRN

## 2021-03-17 MED ORDER — OMEPRAZOLE 20 MG PO CPDR: 20.0000 mg | DELAYED_RELEASE_CAPSULE | Freq: Every day | ORAL | 3 refills | Status: AC

## 2021-03-17 NOTE — Progress Notes (Signed)
Name: Morgan Oneal   MRN: [id number]    DOB: [birth year]   Date:03/17/2021 ? ?     Progress Note ? ?Subjective ? ?Chief Complaint ? ?Chief Complaint  ?Patient presents with  ? Establish Care  ? ? ?HPI ? ?Patient presents for annual CPE and to establish care ? ?Establish care: Last physical was about two years ago. Her primary care provider changed specialties. ? ?Bipolar/Depression/Insomnia/ADHD: She is under the care of psychiatry, Dr. Adele Oneal and is seen by a counselor. She is currently on trazodone 100 mg PRN at night, Strattera 60 mg for ADHD, and Abilify 5 mg for bipolar.  She denies any suicidal thoughts. She says she is very stressed.  Her PHQ 9 is positive.  ? ?Asthma:  she says it flares up when her allergies flare up.  She uses albuterol inhaler as needed.  Will send in a referral.  ? ?Vitamin D deficiency: She says she used to be on supplementation but has not been on one for awhile.  ? ?GERD/ulcers:  she says she has acid reflux when she eats food with hot sauce,  she would like some medication for symptom relief.  Will give prescription for omeprazole. Discussed lifestyle changes to help with acid reflux.  ? ?Hyperlipidemia: Last LDL was 115 on 08/11/2018.  She is not currently on medication.  Will get labs today.  ? ?Diet: She says she changed her diet to pescatarian diet since May. ?Exercise: nothing, discussed recommendations of 150 min a week of physical activity ?Sleep: she says she gets about 5-6, takes trazodone when needed ? ?[place] Office Visit from 03/17/2021 in [facility]  ?AUDIT-C Score 1  ? ?  ? ?Depression: Phq 9 is  positive ?Depression screen [facility] 2/9 03/17/2021 07/19/2020 07/15/2019 04/16/2019 09/11/2018  ?Decreased Interest '1 3 2 2 1  ' ?Down, Depressed, Hopeless '2 2 2 2 3  ' ?PHQ - 2 Score '3 5 4 4 4  ' ?Altered sleeping '1 1 2 2 3  ' ?Tired, decreased energy '3 2 2 3 3  ' ?Change in appetite 0 0 '2 2 3  ' ?Feeling bad or failure about yourself  0 2 0 1 1  ?Trouble concentrating 2  1 0 3 3  ?Moving slowly or fidgety/restless 1 0 0 2 3  ?Suicidal thoughts 0 0 0 0 0  ?PHQ-9 Score '10 11 10 17 20  ' ?Difficult doing work/chores Somewhat difficult Extremely dIfficult Very difficult - Extremely dIfficult  ? ?Hypertension: ?BP Readings from Last 3 Encounters:  ?03/17/21 120/72  ?03/07/21 107/75  ?02/20/21 118/78  ? ?Obesity: ?Wt Readings from Last 3 Encounters:  ?03/17/21 236 lb 6.4 oz (107.2 kg)  ?02/20/21 232 lb (105.2 kg)  ?06/30/20 215 lb (97.5 kg)  ? ?BMI Readings from Last 3 Encounters:  ?03/17/21 37.03 kg/m?  ?02/20/21 36.34 kg/m?  ?06/30/20 33.67 kg/m?  ?  ? ?Vaccines:  ?HPV: up to at age 79 , ask insurance if age between 60-45  ?Shingrix: 9-64 yo and ask insurance if covered when patient above 11 yo ?Pneumonia:  educated and discussed with patient. ?Flu:  educated and discussed with patient. ? ?Hep C Screening: ordered ?STD testing and prevention (HIV/chl/gon/syphilis): 03/20/2018 ?Intimate partner violence: none ?Sexual History : yes, one partner ?Menstrual History/LMP/Abnormal Bleeding: hysterectomy ?Incontinence Symptoms: stress incontinence ? ?Breast cancer:  ?- Last Mammogram: no concerns, does not qualify ?- BRCA gene screening: none ? ?Osteoporosis: Discussed high calcium and vitamin D supplementation, weight bearing exercises ? ?Cervical cancer screening: 08/01/2017, had  hysterectomy ? ?Skin cancer: Discussed monitoring for atypical lesions  ?Colorectal cancer: no concerns, does not qualify ?Lung cancer:   Low Dose CT Chest recommended if Age 57-80 years, 20 pack-year currently smoking OR have quit w/in 15years. Patient does not qualify.   ?ECG: none ? ?Advanced Care Planning: A voluntary discussion about advance care planning including the explanation and discussion of advance directives.  Discussed health care proxy and Living will, and the patient was able to identify a health care proxy as mom, Morgan Oneal.  Patient does not have a living will at present time. If patient does have  living will, I have requested they bring this to the clinic to be scanned in to their chart. ? ?Lipids: ?Lab Results  ?Component Value Date  ? CHOL 190 08/11/2018  ? CHOL 176 06/14/2017  ? ?Lab Results  ?Component Value Date  ? HDL 57.90 08/11/2018  ? HDL 52.50 06/14/2017  ? ?Lab Results  ?Component Value Date  ? LDLCALC 115 (H) 08/11/2018  ? LDLCALC 104 (H) 06/14/2017  ? ?Lab Results  ?Component Value Date  ? TRIG 86.0 08/11/2018  ? TRIG 94.0 06/14/2017  ? ?Lab Results  ?Component Value Date  ? CHOLHDL 3 08/11/2018  ? CHOLHDL 3 06/14/2017  ? ?No results found for: LDLDIRECT ? ?Glucose: ?Glucose, Bld  ?Date Value Ref Range Status  ?04/16/2019 89 70 - 99 mg/dL Final  ?08/11/2018 94 70 - 99 mg/dL Final  ?03/20/2018 109 (H) 70 - 99 mg/dL Final  ? ? ?Patient Active Problem List  ? Diagnosis Date Noted  ? Depression, recurrent ([facility]) 08/11/2018  ? Raynaud's disease, idiopathic 07/24/2017  ? History of abnormal Pap smear 04/23/2012  ? ? ?Past Surgical History:  ?Procedure Laterality Date  ? PARTIAL HYSTERECTOMY  2015  ? ? ?Family History  ?Problem Relation Age of Onset  ? Arthritis Father   ? Arthritis Maternal Grandmother   ? Diabetes Maternal Grandmother   ? Arthritis Paternal Grandmother   ? Asthma Paternal Grandmother   ? Cancer Paternal Grandmother   ? Alcohol abuse Paternal Grandfather   ? ? ?Social History  ? ?Socioeconomic History  ? Marital status: Legally Separated  ?  Spouse name: Not on file  ? Number of children: 3  ? Years of education: Not on file  ? Highest education level: Not on file  ?Occupational History  ? Not on file  ?Tobacco Use  ? Smoking status: Never  ? Smokeless tobacco: Never  ?Vaping Use  ? Vaping Use: Never used  ?Substance and Sexual Activity  ? Alcohol use: Yes  ?  Comment: maybe once/week   ? Drug use: Never  ? Sexual activity: Yes  ?  Partners: Male  ?  Birth control/protection: Surgical  ?Other Topics Concern  ? Not on file  ?Social History Narrative  ? Not on file  ? ?Social  Determinants of Health  ? ?Financial Resource Strain: High Risk  ? Difficulty of Paying Living Expenses: Hard  ?Food Insecurity: No Food Insecurity  ? Worried About Charity fundraiser in the Last Year: Never true  ? Ran Out of Food in the Last Year: Never true  ?Transportation Needs: No Transportation Needs  ? Lack of Transportation (Medical): No  ? Lack of Transportation (Non-Medical): No  ?Physical Activity: Inactive  ? Days of Exercise per Week: 0 days  ? Minutes of Exercise per Session: 0 min  ?Stress: Stress Concern Present  ? Feeling of Stress : Very much  ?Social  Connections: Moderately Isolated  ? Frequency of Communication with Friends and Family: More than three times a week  ? Frequency of Social Gatherings with Friends and Family: More than three times a week  ? Attends Religious Services: Never  ? Active Member of Clubs or Organizations: Yes  ? Attends Archivist Meetings: More than 4 times per year  ? Marital Status: Separated  ?Intimate Partner Violence: Not At Risk  ? Fear of Current or Ex-Partner: No  ? Emotionally Abused: No  ? Physically Abused: No  ? Sexually Abused: No  ? ? ? ?Current Outpatient Medications:  ?  albuterol (VENTOLIN HFA) 108 (90 Base) MCG/ACT inhaler, Inhale 2 puffs into the lungs every 6 (six) hours as needed for wheezing or shortness of breath., Disp: 8 g, Rfl: 3 ?  ARIPiprazole (ABILIFY) 5 MG tablet, Take one tab daily, Disp: 90 tablet, Rfl: 0 ?  aspirin-acetaminophen-caffeine (EXCEDRIN MIGRAINE) 250-250-65 MG tablet, Take 1 tablet by mouth every 6 (six) hours as needed for headache., Disp: , Rfl:  ?  atomoxetine (STRATTERA) 60 MG capsule, Take 1 capsule (60 mg total) by mouth daily., Disp: 90 capsule, Rfl: 0 ?  lidocaine (XYLOCAINE) 2 % solution, Use as directed 15 mLs in the mouth or throat every 3 (three) hours as needed for mouth pain (Sore throat)., Disp: 300 mL, Rfl: 0 ?  omeprazole (PRILOSEC) 20 MG capsule, Take 1 capsule (20 mg total) by mouth daily., Disp:  90 capsule, Rfl: 3 ?  traZODone (DESYREL) 100 MG tablet, Take 1 tablet (100 mg total) by mouth at bedtime as needed. for sleep, Disp: 90 tablet, Rfl: 0 ? ?Allergies  ?Allergen Reactions  ? Mucinex [Guaifenesin E

## 2021-03-21 ENCOUNTER — Ambulatory Visit: Payer: Self-pay

## 2021-03-21 NOTE — Telephone Encounter (Signed)
? ? ?  Chief Complaint: Had 2 asthma attacks over the weekend. Used her nebulizer. Asking for Prednisone to be sent to pharmacy. ?Symptoms: Cough, SOB with exertion, chest tightness ?Frequency: This weekend ?Pertinent Negatives: Patient denies fever ?Disposition: [] ED /[] Urgent Care (no appt availability in office) / [] Appointment(In office/virtual)/ []  [facility]/ [] Home Care/ [] Refused Recommended Disposition /[] [facility]/ [x]  Follow-up with PCP ?Additional Notes: Please advise pt.  ?Answer Assessment - Initial Assessment Questions ?1. RESPIRATORY STATUS: "Describe your breathing?" (e.g., wheezing, shortness of breath, unable to speak, severe coughing)  ?    SOB, chest tightness ?2. ONSET: "When did this asthma attack begin?"  ?    This weekend ?3. TRIGGER: "What do you think triggered this attack?" (e.g., URI, exposure to pollen or other allergen, tobacco smoke)  ?    Allergies ?4. PEAK EXPIRATORY FLOW RATE (PEFR): "Do you use a peak flow meter?" If Yes, ask: "What's the current peak flow? What's your personal best peak flow?"  ?    No ?5. SEVERITY: "How bad is this attack?"  ?  - MILD: No SOB at rest, mild SOB with walking, speaks normally in sentences, can lie down, no retractions, pulse < 100. (GREEN Zone: PEFR 80-100%) ?  - MODERATE: SOB at rest, SOB with minimal exertion and prefers to sit, cannot lie down flat, speaks in phrases, mild retractions, audible wheezing, pulse 100-120. (YELLOW Zone: PEFR 50-79%)  ?  - SEVERE: Struggling for each breath, speaks in single words, struggling to breathe, sitting hunched forward, retractions, usually loud wheezing, sometimes minimal wheezing because of decreased air movement, pulse > 120. (RED Zone: PEFR < 50%).  ?    Mild ?6. ASTHMA MEDICINES:  "What treatments have you given?"  ?  - INHALED QUICK RELIEF (RESCUE): "What is your inhaled quick-relief medicine?" (e.g., albuterol, salbutamol) "Do you use an inhaler or a nebulizer?" "How  frequently have you been using?" ?  - CONTROLLER (LONG-TERM-CONTROL): "Do you take an inhaled steroid? (e.g., Asmanex, Flovent, Pulmicort, Qvar) ?    Albuterol ?7. INHALED QUICK-RELIEF TREATMENTS FOR THIS ATTACK: "What treatments have you given yourself so far?" and "How many and how often?" If using an inhaler, ask, "How many puffs?" Note: Routine treatments are 2 puffs every 4 hours as needed. Rescue treatments are 4 puffs repeated every 20 minutes, up to three times as needed.  ?    Nebulizer ?8. OTHER SYMPTOMS: "Do you have any other symptoms? (e.g., chest pain, coughing up yellow sputum, fever, runny nose) ?    Cough ?9. O2 SATURATION MONITOR:  "Do you use an oxygen saturation monitor (pulse oximeter) at home?" If Yes, "What is your reading (oxygen level) today?" "What is your usual oxygen saturation reading?" (e.g., 95%) ?    No ?10. PREGNANCY: "Is there any chance you are pregnant?" "When was your last menstrual period?" ?      No ? ?Protocols used: Asthma Attack-A-AH ? ?

## 2021-03-21 NOTE — Telephone Encounter (Signed)
FYI

## 2021-03-21 NOTE — Telephone Encounter (Signed)
Pt has an appt scheduled with Raynelle Fanning for 03/22/21 ,pts request.  ?

## 2021-03-22 ENCOUNTER — Encounter: Payer: Self-pay | Admitting: Nurse Practitioner

## 2021-03-22 ENCOUNTER — Telehealth (INDEPENDENT_AMBULATORY_CARE_PROVIDER_SITE_OTHER): Payer: Federal, State, Local not specified - PPO | Admitting: Nurse Practitioner

## 2021-03-22 ENCOUNTER — Other Ambulatory Visit: Payer: Self-pay

## 2021-03-22 DIAGNOSIS — J4541 Moderate persistent asthma with (acute) exacerbation: Secondary | ICD-10-CM | POA: Diagnosis not present

## 2021-03-22 DIAGNOSIS — J029 Acute pharyngitis, unspecified: Secondary | ICD-10-CM

## 2021-03-22 LAB — COMPLETE METABOLIC PANEL WITH GFR
AG Ratio: 1.1 (calc) (ref 1.0–2.5)
ALT: 17 U/L (ref 6–29)
AST: 20 U/L (ref 10–30)
Albumin: 4 g/dL (ref 3.6–5.1)
Alkaline phosphatase (APISO): 71 U/L (ref 31–125)
BUN: 9 mg/dL (ref 7–25)
CO2: 27 mmol/L (ref 20–32)
Calcium: 9.6 mg/dL (ref 8.6–10.2)
Chloride: 102 mmol/L (ref 98–110)
Creat: 0.92 mg/dL (ref 0.50–0.97)
Globulin: 3.7 g/dL (calc) (ref 1.9–3.7)
Glucose, Bld: 79 mg/dL (ref 65–99)
Potassium: 4.3 mmol/L (ref 3.5–5.3)
Sodium: 137 mmol/L (ref 135–146)
Total Bilirubin: 0.5 mg/dL (ref 0.2–1.2)
Total Protein: 7.7 g/dL (ref 6.1–8.1)
eGFR: 83 mL/min/{1.73_m2} (ref >=60)

## 2021-03-22 LAB — CBC WITH DIFFERENTIAL/PLATELET
Absolute Monocytes: 742 cells/uL (ref 200–950)
Basophils Absolute: 82 cells/uL (ref 0–200)
Basophils Relative: 0.8 %
Eosinophils Absolute: 185 cells/uL (ref 15–500)
Eosinophils Relative: 1.8 %
HCT: 39.6 % (ref 35.0–45.0)
Hemoglobin: 13.1 g/dL (ref 11.7–15.5)
Lymphs Abs: 2256 cells/uL (ref 850–3900)
MCH: 29.1 pg (ref 27.0–33.0)
MCHC: 33.1 g/dL (ref 32.0–36.0)
MCV: 88 fL (ref 80.0–100.0)
MPV: 10 fL (ref 7.5–12.5)
Monocytes Relative: 7.2 %
Neutro Abs: 7035 cells/uL (ref 1500–7800)
Neutrophils Relative %: 68.3 %
Platelets: 349 10*3/uL (ref 140–400)
RBC: 4.5 10*6/uL (ref 3.80–5.10)
RDW: 13.1 % (ref 11.0–15.0)
Total Lymphocyte: 21.9 %
WBC: 10.3 10*3/uL (ref 3.8–10.8)

## 2021-03-22 LAB — QUANTIFERON-TB GOLD PLUS
Mitogen-NIL: >10 IU/mL
NIL: 0.05 IU/mL
QuantiFERON-TB Gold Plus: NEGATIVE
TB1-NIL: 0 IU/mL
TB2-NIL: <0 IU/mL

## 2021-03-22 LAB — LIPID PANEL
Cholesterol: 172 mg/dL (ref <200)
HDL: 50 mg/dL (ref >=50)
LDL Cholesterol (Calc): 101 mg/dL (calc) — ABNORMAL HIGH
Non-HDL Cholesterol (Calc): 122 mg/dL (calc) (ref <130)
Total CHOL/HDL Ratio: 3.4 (calc) (ref <5.0)
Triglycerides: 110 mg/dL (ref <150)

## 2021-03-22 LAB — VITAMIN D 25 HYDROXY (VIT D DEFICIENCY, FRACTURES): Vit D, 25-Hydroxy: 20 ng/mL — ABNORMAL LOW (ref 30–100)

## 2021-03-22 MED ORDER — PREDNISONE 10 MG (21) PO TBPK: ORAL_TABLET | ORAL | 0 refills | Status: DC

## 2021-03-22 NOTE — Progress Notes (Signed)
? ?Name: Morgan Oneal   MRN: [id number]    DOB: September 01, 1985   Date:03/22/2021 ? ?     Progress Note ? ?Subjective ? ?Chief Complaint ? ?Chief Complaint  ?Patient presents with  ? Sore Throat  ? Asthma  ? ? ?I connected with  [name]  on 03/22/21 at 11:00 am by a video enabled telemedicine application and verified that I am speaking with the correct person using two identifiers.  I discussed the limitations of evaluation and management by telemedicine and the availability of in person appointments. The patient expressed understanding and agreed to proceed with a virtual visit  Staff also discussed with the patient that there may be a patient responsible charge related to this service. ?Patient Location: home ?Provider Location: [facility] ?Additional Individuals present: alone ? ?HPI ? ?Asthma exacerbation: She says on Sunday night she had an asthma attack and then had another on Monday. She says she has been using her albuterol inhaler and taking her allergy medication.  She say she has also been using her nebulizer machine.  She says she needs some steroids to help.  She is not currently short of breath.  She denies any fever or chills. Will send in course of steroids.  She says she has plenty of albuterol and Pulmicort for her nebulizer machine.  ? ?Sore throat: She says she wants to see an ENT for her sore throat.  She says she has had a sore throat for over three weeks. She says she was seen at urgent care for URI. She says she has been tested for covid, flu and strep.  She says they treated her for a sinus infection and that she completed the antibiotic.  She says this has happened before when she had to see ENT for this and she would like to go back. Will send in referral.   ? ?Patient Active Problem List  ? Diagnosis Date Noted  ? Depression, recurrent ([facility]) 08/11/2018  ? Raynaud's disease, idiopathic 07/24/2017  ? History of abnormal Pap smear 04/23/2012  ? ? ?Social History  ? ?Tobacco Use  ? Smoking  status: Never  ? Smokeless tobacco: Never  ?Substance Use Topics  ? Alcohol use: Yes  ?  Comment: maybe once/week   ? ? ? ?Current Outpatient Medications:  ?  albuterol (VENTOLIN HFA) 108 (90 Base) MCG/ACT inhaler, Inhale 2 puffs into the lungs every 6 (six) hours as needed for wheezing or shortness of breath., Disp: 8 g, Rfl: 3 ?  ARIPiprazole (ABILIFY) 5 MG tablet, Take one tab daily, Disp: 90 tablet, Rfl: 0 ?  aspirin-acetaminophen-caffeine (EXCEDRIN MIGRAINE) 250-250-65 MG tablet, Take 1 tablet by mouth every 6 (six) hours as needed for headache., Disp: , Rfl:  ?  atomoxetine (STRATTERA) 60 MG capsule, Take 1 capsule (60 mg total) by mouth daily., Disp: 90 capsule, Rfl: 0 ?  lidocaine (XYLOCAINE) 2 % solution, Use as directed 15 mLs in the mouth or throat every 3 (three) hours as needed for mouth pain (Sore throat)., Disp: 300 mL, Rfl: 0 ?  omeprazole (PRILOSEC) 20 MG capsule, Take 1 capsule (20 mg total) by mouth daily., Disp: 90 capsule, Rfl: 3 ?  traZODone (DESYREL) 100 MG tablet, Take 1 tablet (100 mg total) by mouth at bedtime as needed. for sleep, Disp: 90 tablet, Rfl: 0 ? ?Allergies  ?Allergen Reactions  ? Mucinex [Guaifenesin Er] Swelling  ? Penicillins Anaphylaxis  ? ? ?I personally reviewed active problem list, medication list, allergies, notes from  last encounter with the patient/caregiver today. ? ?ROS ? ?Constitutional: Negative for fever or weight change.  ?HEENT: positive for sore throat ?Respiratory:Positive for cough and shortness of breath.   ?Cardiovascular: Negative for chest pain or palpitations.  ?Gastrointestinal: Negative for abdominal pain, no bowel changes.  ?Musculoskeletal: Negative for gait problem or joint swelling.  ?Skin: Negative for rash.  ?Neurological: Negative for dizziness or headache.  ?No other specific complaints in a complete review of systems (except as listed in HPI above).  ? ?Objective ? ?Virtual encounter, vitals not obtained. ? ?There is no height or weight on file  to calculate BMI. ? ?Nursing Note and Vital Signs reviewed. ? ?Physical Exam ?Awake, alert and oriented, speaking in complete sentences ? ?No results found for this or any previous visit (from the past 72 hour(s)). ? ?Assessment & Plan ? ?1. Moderate persistent asthma with acute exacerbation ? ?- predniSONE (STERAPRED UNI-PAK 21 TAB) 10 MG (21) TBPK tablet; Take as directed on package.  (60 mg po on day 1, 50 mg po on day 2...)  Dispense: 21 tablet; Refill: 0 ? ?2. Sore throat ? ?- Ambulatory referral to ENT  ? ?-Red flags and when to present for emergency care or RTC including fever >101.44F, chest pain, shortness of breath, new/worsening/un-resolving symptoms,  reviewed with patient at time of visit. Follow up and care instructions discussed and provided in AVS. ?- I discussed the assessment and treatment plan with the patient. The patient was provided an opportunity to ask questions and all were answered. The patient agreed with the plan and demonstrated an understanding of the instructions. ? ?I provided 15 minutes of non-face-to-face time during this encounter. ? ?Berniece Salines, FNP ? ?  ?

## 2021-03-24 ENCOUNTER — Encounter: Payer: Self-pay | Admitting: Nurse Practitioner

## 2021-04-06 ENCOUNTER — Ambulatory Visit (HOSPITAL_COMMUNITY): Payer: Federal, State, Local not specified - PPO | Admitting: Clinical

## 2021-04-06 ENCOUNTER — Telehealth (HOSPITAL_COMMUNITY): Payer: Self-pay | Admitting: Clinical

## 2021-05-17 ENCOUNTER — Encounter: Payer: Self-pay | Admitting: Nurse Practitioner

## 2021-06-09 ENCOUNTER — Telehealth: Payer: Federal, State, Local not specified - PPO | Admitting: Physician Assistant

## 2021-06-09 DIAGNOSIS — R21 Rash and other nonspecific skin eruption: Secondary | ICD-10-CM | POA: Diagnosis not present

## 2021-06-09 MED ORDER — PREDNISONE 10 MG (21) PO TBPK: ORAL_TABLET | ORAL | 0 refills | Status: DC

## 2021-06-09 NOTE — Progress Notes (Signed)
E Visit for Rash  We are sorry that you are not feeling well. Here is how we plan to help!  Based on what you shared with me it looks like you have contact dermatitis.  Contact dermatitis is a skin rash caused by something that touches the skin and causes irritation or inflammation.  Your skin may be red, swollen, dry, cracked, and itch.  The rash should go away in a few days but can last a few weeks.  If you get a rash, it's important to figure out what caused it so the irritant can be avoided in the future.  Prednisone 10 mg daily for 6 days (see taper instructions below)  Directions for 6 day taper: Day 1: 2 tablets before breakfast, 1 after both lunch & dinner and 2 at bedtime Day 2: 1 tab before breakfast, 1 after both lunch & dinner and 2 at bedtime Day 3: 1 tab at each meal & 1 at bedtime Day 4: 1 tab at breakfast, 1 at lunch, 1 at bedtime Day 5: 1 tab at breakfast & 1 tab at bedtime Day 6: 1 tab at breakfast    HOME CARE:  Take cool showers and avoid direct sunlight. Apply cool compress or wet dressings. Take a bath in an oatmeal bath.  Sprinkle content of one Aveeno packet under running faucet with comfortably warm water.  Bathe for 15-20 minutes, 1-2 times daily.  Pat dry with a towel. Do not rub the rash. Use hydrocortisone cream. Take an antihistamine like Benadryl for widespread rashes that itch.  The adult dose of Benadryl is 25-50 mg by mouth 4 times daily. Caution:  This type of medication may cause sleepiness.  Do not drink alcohol, drive, or operate dangerous machinery while taking antihistamines.  Do not take these medications if you have prostate enlargement.  Read package instructions thoroughly on all medications that you take.  GET HELP RIGHT AWAY IF:  Symptoms don't go away after treatment. Severe itching that persists. If you rash spreads or swells. If you rash begins to smell. If it blisters and opens or develops a yellow-brown crust. You develop a  fever. You have a sore throat. You become short of breath.  MAKE SURE YOU:  Understand these instructions. Will watch your condition. Will get help right away if you are not doing well or get worse.  Thank you for choosing an e-visit.  Your e-visit answers were reviewed by a board certified advanced clinical practitioner to complete your personal care plan. Depending upon the condition, your plan could have included both over the counter or prescription medications.  Please review your pharmacy choice. Make sure the pharmacy is open so you can pick up prescription now. If there is a problem, you may contact your provider through MyChart messaging and have the prescription routed to another pharmacy.  Your safety is important to us. If you have drug allergies check your prescription carefully.   For the next 24 hours you can use MyChart to ask questions about today's visit, request a non-urgent call back, or ask for a work or school excuse. You will get an email in the next two days asking about your experience. I hope that your e-visit has been valuable and will speed your recovery.  I provided 5 minutes of non face-to-face time during this encounter for chart review and documentation.   

## 2021-06-21 ENCOUNTER — Telehealth (HOSPITAL_BASED_OUTPATIENT_CLINIC_OR_DEPARTMENT_OTHER): Payer: Federal, State, Local not specified - PPO | Admitting: Psychiatry

## 2021-06-21 ENCOUNTER — Encounter (HOSPITAL_COMMUNITY): Payer: Self-pay | Admitting: Psychiatry

## 2021-06-21 DIAGNOSIS — F902 Attention-deficit hyperactivity disorder, combined type: Secondary | ICD-10-CM | POA: Diagnosis not present

## 2021-06-21 DIAGNOSIS — F319 Bipolar disorder, unspecified: Secondary | ICD-10-CM | POA: Diagnosis not present

## 2021-06-21 MED ORDER — ARIPIPRAZOLE 2 MG PO TABS: ORAL_TABLET | ORAL | 0 refills | Status: DC

## 2021-06-21 MED ORDER — ATOMOXETINE HCL 60 MG PO CAPS: 60.0000 mg | ORAL_CAPSULE | Freq: Every day | ORAL | 0 refills | Status: DC

## 2021-06-21 NOTE — Progress Notes (Signed)
Virtual Visit via Video Note  I connected with [name] on 06/21/21 at  9:00 AM EDT by a video enabled telemedicine application and verified that I am speaking with the correct person using two identifiers.  Location: Patient: Work Provider: Economist   I discussed the limitations of evaluation and management by telemedicine and the availability of in person appointments. The patient expressed understanding and agreed to proceed.  History of Present Illness: Patient is evaluated by phone session.  She was last seen in June 2022.  She admitted not consistent with medication because she was very busy at work and taking care of the kids.  She was taking Strattera on and off and Abilify when she needed.  Patient admitted lately started to have a lot of mood swings, irritability, anger.  She reported excessive spending and sometimes impulsive behavior but denies any road rage or violence.  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  She was seeing Lowella Dandy however since left she is going to see a new therapist soon.  She sleeps okay and does not feel she needs trazodone.  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  She is very happy about it.  She admitted taking Strattera more frequently which is helping her attention, focus and able to get a/b grades.  She understand that she need to take the medicine on time and regularly to help her mood, anger, irritability.  Her job is stressful and also at home taking care of 3 kids makes her irritable.  She admitted sometime mother helps but there are times when she had an argument with the mother.  She had cut down her drinking and she rarely drinks when she is at social gathering but denies any intoxication, binge or any blackouts.  Her appetite is okay.  Her weight is stable.  Patient has 3 kids and now 1 child is working,  other child is enrolled in summer school and third child is in summer program.  Patient has no tremors, shakes or any EPS.  She denies any hallucination, paranoia or any suicidal thoughts.  She is not involved in any self harming behavior.   Past Psychiatric History: H/O ADHD and given Adderall but no details. H/O cutting herself when parents going through divorce.  No h/o inpatient, suicidal attempt, psychosis, mood swings, highs and lows, anger, irritability.  H/O hevay ETOH.     Psychiatric Specialty Exam: Physical Exam  Review of Systems  Weight 236 lb (107 kg).There is no height or weight on file to calculate BMI.  General Appearance: Casual  Eye Contact:  Good  Speech:  Clear and Coherent  Volume:  Normal  Mood:  Anxious and Irritable  Affect:  Congruent  Thought Process:  Goal Directed  Orientation:  Full (Time, Place, and Person)  Thought Content:  Rumination  Suicidal Thoughts:  No  Homicidal Thoughts:  No  Memory:  Immediate;   Good Recent;   Good Remote;   Good  Judgement:  Fair  Insight:  Shallow  Psychomotor Activity:  Normal  Concentration:  Concentration: Fair and Attention Span: Fair  Recall:  Fair  Fund of Knowledge:  Good  Language:  Good  Akathisia:  No  Handed:  Right  AIMS (if indicated):     Assets:  Communication Skills Desire for Improvement Housing Social Support Talents/Skills  Transportation  ADL's:  Intact  Cognition:  WNL  Sleep:   ok      Assessment and Plan: Bipolar disorder type I.  ADHD, combined type.  I review her current medication, psychosocial stressors, blood work results.  She is no longer taking trazodone.  She is taking Abilify when she needed but taking Strattera more frequently to help her attention and focus.  She feels her attention concentration is okay when she take Strattera but like to go back on medicine to help her mood, anger, mania.  She also like to have FMLA to be filled so she can take time off when she has  flares.  She is reading to have her appointment with a new therapist since her previous therapist left.  In the past she has taken Abilify with good response but not consistent.  We talk about compliance and consistency to have a better results.  Patient agreed and like to focus on her mental health.  We will start Abilify 2 mg and recommend to take daily.  She used to take 5 mg in the past.  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  Discussed coping skills to help her anger.  Recommended to call us back if she has any question or any concern.  She has cut down her drinking from the past.  Discussed safety concerns at any time having active suicidal thoughts or homicidal halogen need to call 911 or go to local emergency room.  Follow-up in 6 weeks.  Follow Up Instructions:    I discussed the assessment and treatment plan with the patient. The patient was provided an opportunity to ask questions and all were answered. The patient agreed with the plan and demonstrated an understanding of the instructions.   The patient was advised to call back or seek an in-person evaluation if the symptoms worsen or if the condition fails to improve as anticipated.  Collaboration of Care: Primary Care Provider AEB notes are available in epic to review.  Patient/Guardian was advised Release of Information must be obtained prior to any record release in order to collaborate their care with an outside provider. Patient/Guardian was advised if they have not already done so to contact the registration department to sign all necessary forms in order for [place] to release information regarding their care.   Consent: Patient/Guardian gives verbal consent for treatment and assignment of benefits for services provided during this visit. Patient/Guardian expressed understanding and agreed to proceed.    I provided 33  minutes of non-face-to-face time during this encounter.   Cleotis Nipper, MD

## 2021-06-22 ENCOUNTER — Telehealth (HOSPITAL_COMMUNITY): Payer: Self-pay | Admitting: *Deleted

## 2021-06-22 NOTE — Telephone Encounter (Signed)
Pt FMLA forms completed. Pt to come in tomorrow to pay and pick up completed forms for her employer, [employer].

## 2021-07-17 ENCOUNTER — Ambulatory Visit: Payer: Federal, State, Local not specified - PPO | Admitting: Family Medicine

## 2021-07-17 VITALS — BP 129/72 | Ht 67.0 in | Wt 250.0 lb

## 2021-07-17 DIAGNOSIS — G8929 Other chronic pain: Secondary | ICD-10-CM | POA: Diagnosis not present

## 2021-07-17 DIAGNOSIS — M25561 Pain in right knee: Secondary | ICD-10-CM | POA: Diagnosis not present

## 2021-07-17 NOTE — Patient Instructions (Signed)
We will go ahead with an MRI of your knee - you have a meniscus tear. I will call you with results and next steps (likely referral to surgeon to discuss a knee scope).

## 2021-07-18 ENCOUNTER — Encounter: Payer: Self-pay | Admitting: Family Medicine

## 2021-07-18 NOTE — Progress Notes (Signed)
PCP: Berniece Salines, FNP  Subjective:   HPI: Patient is a 36 y.o. female here for right knee pain.  2/20: Right knee pain Ongoing for years. Old high school extension injury and pregnancy related weight gain which led to a knee scope in 2015. Most recently MRI followed by PT 2 years ago. She was told at this time her IT band was tight. She is now experiencing swelling after strenuous activity, cracking sound, and feeling as if her knee is going to give out. Denies any known additional injury. She has tried Kinesiotaping for relief.   7/17: Patient reports she continues to struggle with right knee pain. She did physical therapy, home exercises and continues to have issues. + instability. Difficulty squatting, twisting. No new injuries since last visit. Pain still lateral.  Past Medical History:  Diagnosis Date   Asthma    Depression     Current Outpatient Medications on File Prior to Visit  Medication Sig Dispense Refill   albuterol (VENTOLIN HFA) 108 (90 Base) MCG/ACT inhaler Inhale 2 puffs into the lungs every 6 (six) hours as needed for wheezing or shortness of breath. 8 g 3   ARIPiprazole (ABILIFY) 2 MG tablet Take one tab daily 90 tablet 0   aspirin-acetaminophen-caffeine (EXCEDRIN MIGRAINE) 250-250-65 MG tablet Take 1 tablet by mouth every 6 (six) hours as needed for headache.     atomoxetine (STRATTERA) 60 MG capsule Take 1 capsule (60 mg total) by mouth daily. 90 capsule 0   omeprazole (PRILOSEC) 20 MG capsule Take 1 capsule (20 mg total) by mouth daily. 90 capsule 3   predniSONE (STERAPRED UNI-PAK 21 TAB) 10 MG (21) TBPK tablet 6 day taper; take as directed on package instructions 21 tablet 0   traZODone (DESYREL) 100 MG tablet Take 1 tablet (100 mg total) by mouth at bedtime as needed. for sleep 90 tablet 0   No current facility-administered medications on file prior to visit.    Past Surgical History:  Procedure Laterality Date   PARTIAL HYSTERECTOMY  2015     Allergies  Allergen Reactions   Mucinex [Guaifenesin Er] Swelling   Penicillins Anaphylaxis    BP 129/72   Ht 5\' 7"  (1.702 m)   Wt 250 lb (113.4 kg)   LMP  (LMP Unknown)   BMI 39.16 kg/m      02/20/2021    2:03 PM  [facility]  Frequency of aerobic exercise (# of days/week) 0  Average time in minutes 0  Frequency of strengthening activities (# of days/week) 0        No data to display              Objective:  Physical Exam:  Gen: NAD, comfortable in exam room  Right knee: No gross deformity, ecchymoses, swelling. TTP lateral joint line.  No other tenderness. FROM with normal strength. Negative ant/post drawers. Negative valgus/varus testing. Negative lachman.  Positive mcmurrays, apleys, thessalys. NV intact distally.   Assessment & Plan:  1. Right knee pain - consistent with meniscus tear not responding to conservative treatment.  Will go ahead with MRI to confirm and refer to ortho to discuss arthroscopic debridement.  OTC medications in meantime.

## 2021-07-23 ENCOUNTER — Ambulatory Visit
Admission: RE | Admit: 2021-07-23 | Discharge: 2021-07-23 | Disposition: A | Discharge status: Home / Self Care | Payer: Federal, State, Local not specified - PPO | Source: Ambulatory Visit | Attending: Family Medicine | Admitting: Family Medicine

## 2021-07-23 DIAGNOSIS — G8929 Other chronic pain: Secondary | ICD-10-CM

## 2021-07-23 DIAGNOSIS — M25461 Effusion, right knee: Secondary | ICD-10-CM | POA: Diagnosis not present

## 2021-07-23 DIAGNOSIS — S8991XA Unspecified injury of right lower leg, initial encounter: Secondary | ICD-10-CM | POA: Diagnosis not present

## 2021-07-23 DIAGNOSIS — M1711 Unilateral primary osteoarthritis, right knee: Secondary | ICD-10-CM | POA: Diagnosis not present

## 2021-07-26 ENCOUNTER — Other Ambulatory Visit: Payer: Self-pay | Admitting: *Deleted

## 2021-07-26 NOTE — Progress Notes (Signed)
[facility] [address], [place], [place] [address] Phone: [phone] Morgan Oneal Aug 4th at 1015a

## 2021-08-01 ENCOUNTER — Telehealth (HOSPITAL_COMMUNITY): Payer: Federal, State, Local not specified - PPO | Admitting: Psychiatry

## 2021-08-02 ENCOUNTER — Encounter (HOSPITAL_COMMUNITY): Payer: Self-pay | Admitting: Psychiatry

## 2021-08-02 ENCOUNTER — Telehealth (HOSPITAL_BASED_OUTPATIENT_CLINIC_OR_DEPARTMENT_OTHER): Payer: Federal, State, Local not specified - PPO | Admitting: Psychiatry

## 2021-08-02 DIAGNOSIS — F319 Bipolar disorder, unspecified: Secondary | ICD-10-CM

## 2021-08-02 DIAGNOSIS — F902 Attention-deficit hyperactivity disorder, combined type: Secondary | ICD-10-CM | POA: Diagnosis not present

## 2021-08-02 MED ORDER — ARIPIPRAZOLE 2 MG PO TABS: ORAL_TABLET | ORAL | 0 refills | Status: DC

## 2021-08-02 MED ORDER — ATOMOXETINE HCL 60 MG PO CAPS: 60.0000 mg | ORAL_CAPSULE | Freq: Every day | ORAL | 0 refills | Status: DC

## 2021-08-02 NOTE — Progress Notes (Signed)
Virtual Visit via Telephone Note  I connected with Morgan Oneal on 08/02/21 at 10:20 AM EDT by telephone and verified that I am speaking with the correct person using two identifiers.  Location: Patient: Work Provider: Economist   I discussed the limitations, risks, security and privacy concerns of performing an evaluation and management service by telephone and the availability of in person appointments. I also discussed with the patient that there may be a patient responsible charge related to this service. The patient expressed understanding and agreed to proceed.   History of Present Illness: Patient is evaluated by phone session.  She was not able to do the video because she is at work.  She reported taking the medication Abilify and Strattera most of the time.  She has not taken trazodone because her sleep is good with a combination of Abilify and Strattera.  She endorses financial issues but otherwise she feel medicine helping her mood swing irritability and she denies any recent excessive spending and mania.  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  We have completed FMLA in case she has flareup so she can take time off.  She also recommended to see a therapist and tomorrow she has appointment with therapist in our office.  In the past she has taken higher dose of Abilify but she feels the current dose of 2 mg is working and does not want to increase the dose.  She has 3 kids and she admitted sometime it is Overwhelming.  She has cut down her drinking since the last visit and she did not recall any sip since then.  Her recent weight was 250.  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  Patient denies any tremors or shakes or any EPS.  She like Strattera because it helps her attention focus and she is able to do multitasking.  Past Psychiatric History: H/O ADHD and given Adderall but no details. H/O cutting herself when parents going  through divorce.  No h/o inpatient, suicidal attempt, psychosis, mood swings, highs and lows, anger, irritability.  H/O hevay ETOH.    Psychiatric Specialty Exam: Physical Exam  Review of Systems  Weight 250 lb (113.4 kg).There is no height or weight on file to calculate BMI.  General Appearance: NA  Eye Contact:  NA  Speech:  Clear and Coherent and Slow  Volume:  Normal  Mood:  Anxious  Affect:  NA  Thought Process:  Descriptions of Associations: Intact  Orientation:  Full (Time, Place, and Person)  Thought Content:  Rumination  Suicidal Thoughts:  No  Homicidal Thoughts:  No  Memory:  Immediate;   Good Recent;   Good Remote;   Good  Judgement:  Intact  Insight:  Shallow  Psychomotor Activity:  NA  Concentration:  Concentration: Fair and Attention Span: Fair  Recall:  [employer] of Knowledge:  Good  Language:  Good  Akathisia:  No  Handed:  Right  AIMS (if indicated):     Assets:  Communication Skills Desire for Improvement Housing Talents/Skills Transportation  ADL's:  Intact  Cognition:  WNL  Sleep:   ok      Assessment and Plan: Bipolar disorder type I.  ADHD, combined type.  Patient doing better since the last visit.  She is not taking trazodone but trying to be consistent with Abilify 2 mg and Strattera 60 mg.  She is going to start therapy and had appointment  tomorrow with Morgan Oneal in our office.  Discussed medication side effects and benefits.  Recommended to call us back if she has any question or any concern.  Follow-up in 3 months.  Follow Up Instructions:    I discussed the assessment and treatment plan with the patient. The patient was provided an opportunity to ask questions and all were answered. The patient agreed with the plan and demonstrated an understanding of the instructions.   The patient was advised to call back or seek an in-person evaluation if the symptoms worsen or if the condition fails to improve as anticipated.  Collaboration of  Care: Primary Care Provider AEB notes are available in epic to review.  Patient/Guardian was advised Release of Information must be obtained prior to any record release in order to collaborate their care with an outside provider. Patient/Guardian was advised if they have not already done so to contact the registration department to sign all necessary forms in order for [place] to release information regarding their care.   Consent: Patient/Guardian gives verbal consent for treatment and assignment of benefits for services provided during this visit. Patient/Guardian expressed understanding and agreed to proceed.    I provided 17 minutes of non-face-to-face time during this encounter.   Cleotis Nipper, MD

## 2021-08-04 DIAGNOSIS — M25561 Pain in right knee: Secondary | ICD-10-CM | POA: Diagnosis not present

## 2021-08-10 ENCOUNTER — Ambulatory Visit (INDEPENDENT_AMBULATORY_CARE_PROVIDER_SITE_OTHER): Payer: Federal, State, Local not specified - PPO | Admitting: Licensed Clinical Social Worker

## 2021-08-10 ENCOUNTER — Encounter (HOSPITAL_COMMUNITY): Payer: Self-pay

## 2021-08-10 DIAGNOSIS — F319 Bipolar disorder, unspecified: Secondary | ICD-10-CM

## 2021-08-10 DIAGNOSIS — Z566 Other physical and mental strain related to work: Secondary | ICD-10-CM | POA: Diagnosis not present
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
## 2021-08-10 NOTE — Plan of Care (Signed)
Developed tx plan based on pt self reported input  

## 2021-08-13 ENCOUNTER — Encounter (HOSPITAL_COMMUNITY): Payer: Self-pay | Admitting: Licensed Clinical Social Worker

## 2021-08-23 ENCOUNTER — Ambulatory Visit: Payer: Federal, State, Local not specified - PPO | Admitting: Nurse Practitioner

## 2021-08-24 ENCOUNTER — Ambulatory Visit: Payer: Federal, State, Local not specified - PPO | Admitting: Nurse Practitioner

## 2021-09-19 DIAGNOSIS — M25562 Pain in left knee: Secondary | ICD-10-CM | POA: Diagnosis not present

## 2021-09-20 ENCOUNTER — Encounter: Payer: Self-pay | Admitting: Nurse Practitioner

## 2021-09-20 ENCOUNTER — Telehealth: Payer: Self-pay | Admitting: Nurse Practitioner

## 2021-09-20 ENCOUNTER — Other Ambulatory Visit: Payer: Self-pay

## 2021-09-20 ENCOUNTER — Ambulatory Visit (INDEPENDENT_AMBULATORY_CARE_PROVIDER_SITE_OTHER): Payer: Federal, State, Local not specified - PPO | Admitting: Nurse Practitioner

## 2021-09-20 VITALS — BP 120/74 | HR 92 | Temp 98.5°F | Resp 18 | Ht 67.0 in | Wt 244.2 lb

## 2021-09-20 DIAGNOSIS — J069 Acute upper respiratory infection, unspecified: Secondary | ICD-10-CM | POA: Diagnosis not present

## 2021-09-20 DIAGNOSIS — J0140 Acute pansinusitis, unspecified: Secondary | ICD-10-CM

## 2021-09-20 MED ORDER — BENZONATATE 100 MG PO CAPS: 100.0000 mg | ORAL_CAPSULE | Freq: Two times a day (BID) | ORAL | 0 refills | Status: DC | PRN

## 2021-09-20 MED ORDER — DOXYCYCLINE HYCLATE 100 MG PO TABS: 100.0000 mg | ORAL_TABLET | Freq: Two times a day (BID) | ORAL | 0 refills | Status: AC

## 2021-09-20 NOTE — Progress Notes (Signed)
BP 120/74   Pulse 92   Temp 98.5 F (36.9 C) (Oral)   Resp 18   Ht '5\' 7"'  (1.702 m)   Wt 244 lb 3.2 oz (110.8 kg)   LMP  (LMP Unknown)   SpO2 98%   BMI 38.25 kg/m    Subjective:    Patient ID: Morgan Oneal, female    DOB: 09/19/1985, 35 y.o.   MRN: [id number]  HPI: Morgan Oneal is a 36 y.o. female  Chief Complaint  Patient presents with   Sinusitis    Cough, congested, sore throat, headache for 1 week. Had Covid test at job that was negative   URI/sinus infection: Patient reports she has had cough, congestion, sore throat and headache, sinus pressure for one week. Patient states she was tested for covid at work and it was negative. She reports she has taken allegra, dayquil.  She denies any documented fever or shortness of breath.  She says she does have episodes of sweating.  Patient was recently on steroids for a rash.  We will start patient on doxycycline for sinus infection and send in [facility] for cough.  Patient can take Zyrtec, Flonase and Mucinex.  Relevant past medical, surgical, family and social history reviewed and updated as indicated. Interim medical history since our last visit reviewed. Allergies and medications reviewed and updated.  Review of Systems  Constitutional: Negative for fever or weight change.  HEENT: positive for nasal congestion, facial pressure and tenderness Respiratory: positive for cough and negative shortness of breath.   Cardiovascular: Negative for chest pain or palpitations.  Gastrointestinal: Negative for abdominal pain, no bowel changes.  Musculoskeletal: Negative for gait problem or joint swelling.  Skin: Negative for rash.  Neurological: Negative for dizziness or headache.  No other specific complaints in a complete review of systems (except as listed in HPI above).      Objective:    BP 120/74   Pulse 92   Temp 98.5 F (36.9 C) (Oral)   Resp 18   Ht '5\' 7"'  (1.702 m)   Wt 244 lb 3.2 oz (110.8 kg)   LMP  (LMP  Unknown)   SpO2 98%   BMI 38.25 kg/m   Wt Readings from Last 3 Encounters:  09/20/21 244 lb 3.2 oz (110.8 kg)  07/17/21 250 lb (113.4 kg)  03/17/21 236 lb 6.4 oz (107.2 kg)    Physical Exam  Constitutional: Patient appears well-developed and well-nourished. Obese  No distress.  HEENT: head atraumatic, normocephalic, pupils equal and reactive to light, ears TMs clear, neck supple, throat within normal limits, facial tenderness Cardiovascular: Normal rate, regular rhythm and normal heart sounds.  No murmur heard. No BLE edema. Pulmonary/Chest: Effort normal and breath sounds normal. No respiratory distress. Abdominal: Soft.  There is no tenderness. Psychiatric: Patient has a normal mood and affect. behavior is normal. Judgment and thought content normal.   Results for orders placed or performed in visit on 03/17/21  VITAMIN D 25 Hydroxy (Vit-D Deficiency, Fractures)  Result Value Ref Range   Vit D, 25-Hydroxy 20 (L) 30 - 100 ng/mL  CBC with Differential/Platelet  Result Value Ref Range   WBC 10.3 3.8 - 10.8 Thousand/uL   RBC 4.50 3.80 - 5.10 Million/uL   Hemoglobin 13.1 11.7 - 15.5 g/dL   HCT 39.6 35.0 - 45.0 %   MCV 88.0 80.0 - 100.0 fL   MCH 29.1 27.0 - 33.0 pg   MCHC 33.1 32.0 - 36.0 g/dL  RDW 13.1 11.0 - 15.0 %   Platelets 349 140 - 400 Thousand/uL   MPV 10.0 7.5 - 12.5 fL   Neutro Abs 7,035 1,500 - 7,800 cells/uL   Lymphs Abs 2,256 850 - 3,900 cells/uL   Absolute Monocytes 742 200 - 950 cells/uL   Eosinophils Absolute 185 15 - 500 cells/uL   Basophils Absolute 82 0 - 200 cells/uL   Neutrophils Relative % 68.3 %   Total Lymphocyte 21.9 %   Monocytes Relative 7.2 %   Eosinophils Relative 1.8 %   Basophils Relative 0.8 %  COMPLETE METABOLIC PANEL WITH GFR  Result Value Ref Range   Glucose, Bld 79 65 - 99 mg/dL   BUN 9 7 - 25 mg/dL   Creat 0.92 0.50 - 0.97 mg/dL   eGFR 83 > OR = 60 mL/min/1.31m   BUN/Creatinine Ratio NOT APPLICABLE 6 - 22 (calc)   Sodium 137 135 -  146 mmol/L   Potassium 4.3 3.5 - 5.3 mmol/L   Chloride 102 98 - 110 mmol/L   CO2 27 20 - 32 mmol/L   Calcium 9.6 8.6 - 10.2 mg/dL   Total Protein 7.7 6.1 - 8.1 g/dL   Albumin 4.0 3.6 - 5.1 g/dL   Globulin 3.7 1.9 - 3.7 g/dL (calc)   AG Ratio 1.1 1.0 - 2.5 (calc)   Total Bilirubin 0.5 0.2 - 1.2 mg/dL   Alkaline phosphatase (APISO) 71 31 - 125 U/L   AST 20 10 - 30 U/L   ALT 17 6 - 29 U/L  Lipid panel  Result Value Ref Range   Cholesterol 172 <200 mg/dL   HDL 50 > OR = 50 mg/dL   Triglycerides 110 <150 mg/dL   LDL Cholesterol (Calc) 101 (H) mg/dL (calc)   Total CHOL/HDL Ratio 3.4 <5.0 (calc)   Non-HDL Cholesterol (Calc) 122 <130 mg/dL (calc)  QuantiFERON-TB Gold Plus  Result Value Ref Range   QuantiFERON-TB Gold Plus NEGATIVE NEGATIVE   NIL 0.05 IU/mL   Mitogen-NIL >10.00 IU/mL   TB1-NIL 0.00 IU/mL   TB2-NIL <0.00 IU/mL      Assessment & Plan:   Problem List Items Addressed This Visit   None Visit Diagnoses     Viral upper respiratory tract infection    -  Primary   Start taking Zyrtec, Flonase, Mucinex.  Push fluids and get rest.   Relevant Medications   benzonatate (TESSALON) 100 MG capsule   Acute non-recurrent pansinusitis       Start taking doxycycline.  Start taking Flonase, Zyrtec and Mucinex.   Relevant Medications   doxycycline (VIBRA-TABS) 100 MG tablet   benzonatate (TESSALON) 100 MG capsule        Follow up plan: Return if symptoms worsen or fail to improve.

## 2021-09-20 NOTE — Telephone Encounter (Unsigned)
Copied from [place] [phone]. Topic: Referral - Status >> Sep 20, 2021  3:32 PM Cyndi Bender wrote: Reason for CRM: Pt stated she contacted [facility] [place] as suggested by Serafina Royals but she was told that a referral was needed. Pt requests that a referral be sent to [facility] [place]

## 2021-09-21 ENCOUNTER — Other Ambulatory Visit: Payer: Self-pay | Admitting: Nurse Practitioner

## 2021-09-21 DIAGNOSIS — E669 Obesity, unspecified: Secondary | ICD-10-CM

## 2021-09-21 NOTE — Telephone Encounter (Signed)
Referral has been placed. 

## 2021-09-25 ENCOUNTER — Ambulatory Visit (INDEPENDENT_AMBULATORY_CARE_PROVIDER_SITE_OTHER): Payer: Federal, State, Local not specified - PPO | Admitting: Licensed Clinical Social Worker

## 2021-09-25 DIAGNOSIS — F902 Attention-deficit hyperactivity disorder, combined type: Secondary | ICD-10-CM

## 2021-09-25 DIAGNOSIS — F319 Bipolar disorder, unspecified: Secondary | ICD-10-CM

## 2021-09-25 DIAGNOSIS — Z566 Other physical and mental strain related to work: Secondary | ICD-10-CM

## 2021-09-25 NOTE — Progress Notes (Signed)
Virtual Visit via Audio Note  I connected with Morgan Oneal on 09/26/21 at  4:00 PM EDT by an audio enabled telemedicine application and verified that I am speaking with the correct person using two identifiers.  Location: Patient: home Provider: remote office [name], [place])    I discussed the limitations of evaluation and management by telemedicine and the availability of in person appointments. The patient expressed understanding and agreed to proceed.   I discussed the assessment and treatment plan with the patient. The patient was provided an opportunity to ask questions and all were answered. The patient agreed with the plan and demonstrated an understanding of the instructions.   The patient was advised to call back or seek an in-person evaluation if the symptoms worsen or if the condition fails to improve as anticipated.  I provided 45 minutes of non-face-to-face time during this encounter.   Morgan R Isys Tietje, LCSW  THERAPIST PROGRESS NOTE  Session Time: 4-445p  Participation Level: Active  Behavioral Response: NA-phone sessionAlertAnxious  Type of Therapy: Individual Therapy  Treatment Goals addressed: Problem: Depression Goal: Decrease depressive symptoms and improve levels of effective functioning-pt reports a decrease in overall depression symptoms 3 out of 5 sessions documented.  Outcome: Progressing Goal: Develop healthy thinking patterns and beliefs about self, others, and the world that lead to the alleviation and help prevent the relapse of depression per self report 3 out of 5 sessions documented.   Outcome: Progressing Intervention: Encourage verbalization of feelings/concerns/expectations Note: Allowed/explored Intervention: REVIEW PLEASE SKILLS (TREAT PHYSICAL ILLNESS, BALANCE EATING, AVOID MOOD-ALTERING SUBSTANCES, BALANCE SLEEP AND GET EXERCISE) WITH Morgan Oneal Note: reviewed   Problem: Anxiety Disorder  Goal: Reduce overall frequency,  intensity, and duration of the anxiety so that daily functioning is not impaired per pt self report 3 out of 5 sessions documented.   Outcome: Progressing Goal: Enhance ability to effectively cope with the full variety of life's worries and anxiety per self report 3 out of 5 sessions documented  Outcome: Progressing Intervention: Assist with relaxation techniques, as appropriate (deep breathing exercises, meditation, guided imagery) Note: Reviewed     ProgressTowards Goals: Progressing  Interventions: CBT, Motivational Interviewing, and Family Systems  Summary: Morgan Oneal is a 36 y.o. female who presents with continuing stress associated with bipolar disorder, stress, and ADHD.   Allowed pt to explore and express thoughts and feelings associated with recent life situations and external stressors.Patient reports that she is continuing to work hard at finding life balance. Patient reports that school is great, but she isn't balancing it with being a mom and dealing with financial stress. Patient reports that she is being more intentional about focusing on self-care behaviors, and engaging in activities that bring her happiness. Patient reports that she has attended church recently, and felt that that was a positive social engagement. Patient reports that she also felt better after leaving church.  Patient reports that there are still days where she feels down and depressed. Allowed patient to explore specific symptoms that she experiences when she feels that she is depressed. Patient reports sometimes she feels it is hard to get out of bed on these days. Allow patient to identify strategies that help motivate her, increase her initiative, and help her get out of the bed. Discussed importance of daily structure and positive social engagements. Patient reports "at the end of the day, i have to keep it moving because I'm the one that has to take care of these kids".   Discussed relationship with  ex--he has been calling and trying to get patient to hang out with him. Patient reports that she feels proud in her ability to say "no" in these situations because setting limits and setting boundaries has been difficult for her in the past period praised patient on her progress with setting boundaries for herself.  Continued recommendations are as follows: self care behaviors, positive social engagements, focusing on overall work/home/life balance, and focusing on positive physical and emotional wellness.    Suicidal/Homicidal: No  Therapist Response: Pt is continuing to apply interventions learned in session into daily life situations. Pt is currently on track to meet goals utilizing interventions mentioned above. Personal growth and progress noted. Treatment to continue as indicated.    Plan: Return again in 4-6 weeks.  Diagnosis: No diagnosis found.  Collaboration of Care: Other pt encouraged to continue care with psychiatrist of record, Dr. Kathryne Sharper  Patient/Guardian was advised Release of Information must be obtained prior to any record release in order to collaborate their care with an outside provider. Patient/Guardian was advised if they have not already done so to contact the registration department to sign all necessary forms in order for [place] to release information regarding their care.   Consent: Patient/Guardian gives verbal consent for treatment and assignment of benefits for services provided during this visit. Patient/Guardian expressed understanding and agreed to proceed.   Tailyn Hantz Markice Torbert, LCSW 09/25/2021

## 2021-09-26 ENCOUNTER — Encounter (INDEPENDENT_AMBULATORY_CARE_PROVIDER_SITE_OTHER): Payer: Federal, State, Local not specified - PPO | Admitting: Internal Medicine

## 2021-09-26 NOTE — Plan of Care (Signed)
  Problem: Depression Goal: Decrease depressive symptoms and improve levels of effective functioning-pt reports a decrease in overall depression symptoms 3 out of 5 sessions documented.  Outcome: Progressing Goal: Develop healthy thinking patterns and beliefs about self, others, and the world that lead to the alleviation and help prevent the relapse of depression per self report 3 out of 5 sessions documented.   Outcome: Progressing Intervention: Encourage verbalization of feelings/concerns/expectations Note: Allowed/explored Intervention: REVIEW PLEASE SKILLS (TREAT PHYSICAL ILLNESS, BALANCE EATING, AVOID MOOD-ALTERING SUBSTANCES, BALANCE SLEEP AND GET EXERCISE) WITH Morgan Oneal Note: reviewed   Problem: Anxiety Disorder  Goal: Reduce overall frequency, intensity, and duration of the anxiety so that daily functioning is not impaired per pt self report 3 out of 5 sessions documented.   Outcome: Progressing Goal: Enhance ability to effectively cope with the full variety of life's worries and anxiety per self report 3 out of 5 sessions documented  Outcome: Progressing Intervention: Assist with relaxation techniques, as appropriate (deep breathing exercises, meditation, guided imagery) Note: Reviewed

## 2021-09-28 DIAGNOSIS — M25562 Pain in left knee: Secondary | ICD-10-CM | POA: Diagnosis not present

## 2021-09-28 DIAGNOSIS — M25561 Pain in right knee: Secondary | ICD-10-CM | POA: Diagnosis not present

## 2021-09-28 DIAGNOSIS — M222X2 Patellofemoral disorders, left knee: Secondary | ICD-10-CM | POA: Diagnosis not present

## 2021-09-28 DIAGNOSIS — M222X1 Patellofemoral disorders, right knee: Secondary | ICD-10-CM | POA: Diagnosis not present

## 2021-11-01 ENCOUNTER — Telehealth (HOSPITAL_BASED_OUTPATIENT_CLINIC_OR_DEPARTMENT_OTHER): Payer: Federal, State, Local not specified - PPO | Admitting: Psychiatry

## 2021-11-01 ENCOUNTER — Encounter (HOSPITAL_COMMUNITY): Payer: Self-pay | Admitting: Psychiatry

## 2021-11-01 DIAGNOSIS — F319 Bipolar disorder, unspecified: Secondary | ICD-10-CM | POA: Diagnosis not present

## 2021-11-01 DIAGNOSIS — F902 Attention-deficit hyperactivity disorder, combined type: Secondary | ICD-10-CM | POA: Diagnosis not present

## 2021-11-01 MED ORDER — ATOMOXETINE HCL 60 MG PO CAPS: 60.0000 mg | ORAL_CAPSULE | Freq: Every day | ORAL | 0 refills | Status: DC

## 2021-11-01 MED ORDER — ARIPIPRAZOLE 2 MG PO TABS: ORAL_TABLET | ORAL | 0 refills | Status: DC

## 2021-11-01 NOTE — Progress Notes (Signed)
Virtual Visit via Telephone Note  I connected with Morgan Oneal on 11/01/21 at 10:40 AM EDT by telephone and verified that I am speaking with the correct person using two identifiers.  Location: Patient: In Car Provider: Home Office   I discussed the limitations, risks, security and privacy concerns of performing an evaluation and management service by telephone and the availability of in person appointments. I also discussed with the patient that there may be a patient responsible charge related to this service. The patient expressed understanding and agreed to proceed.   History of Present Illness: Patient is evaluated by phone session.  She is in the car.  She reported medicine and therapy is working well.  She denies any recent mania, impulsive behavior, panic attacks.  She has not taken any time off recently and has not been drinking for more than 2 months.  She started walking every day and watching her calorie intake.  She has lost 12 pounds in the past 2 months.  She does intermittent fasting and her energy level is good.  Her job is challenging but manageable.  Patient lives with her 3 kids and sometimes overwhelmed but symptoms are manageable.  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  She reported attention concentration is improved from the past since taking the Strattera.  She has no tremors, shakes or any EPS.  She is in therapy with [facility].  Past Psychiatric History: H/O ADHD and given Adderall but no details. H/O cutting herself when parents going through divorce.  No h/o inpatient, suicidal attempt, psychosis, mood swings, highs and lows, anger, irritability.  H/O hevay ETOH.    Psychiatric Specialty Exam: Physical Exam  Review of Systems  Weight 232 lb (105.2 kg).There is no height or weight on file to calculate BMI.  General Appearance: NA  Eye Contact:  NA  Speech:  Clear and Coherent and Normal Rate  Volume:  Normal   Mood:  Euthymic  Affect:  Congruent  Thought Process:  Goal Directed  Orientation:  Full (Time, Place, and Person)  Thought Content:  WDL  Suicidal Thoughts:  No  Homicidal Thoughts:  No  Memory:  Immediate;   Good Recent;   Good Remote;   Good  Judgement:  Good  Insight:  Present  Psychomotor Activity:  Normal  Concentration:  Concentration: Fair and Attention Span: Fair  Recall:  Good  Fund of Knowledge:  Good  Language:  Good  Akathisia:  No  Handed:  Right  AIMS (if indicated):     Assets:  Communication Skills Desire for Improvement Housing Resilience Social Support Talents/Skills Transportation  ADL's:  Intact  Cognition:  WNL  Sleep:   ok      Assessment and Plan: Bipolar disorder type I.  ADHD, combined type.  Patient is stable on Abilify 2 mg and Strattera 60 mg daily.  She has taken twice trazodone as overall her sleep is improved.  Discussed medication side effects and benefits.  Encouraged to continue therapy with [facility].  Recommend to call us back if she has any question or any concern.  Follow-up in 3 months.  Follow Up Instructions:    I discussed the assessment and treatment plan with the patient. The patient was provided an opportunity to ask questions and all were answered. The patient agreed with the plan and demonstrated an understanding of the instructions.   The patient was advised to call back or seek an in-person  evaluation if the symptoms worsen or if the condition fails to improve as anticipated.  Collaboration of Care: Other provider involved in patient's care AEB notes are available in epic to review.  Patient/Guardian was advised Release of Information must be obtained prior to any record release in order to collaborate their care with an outside provider. Patient/Guardian was advised if they have not already done so to contact the registration department to sign all necessary forms in order for [place] to release information regarding  their care.   Consent: Patient/Guardian gives verbal consent for treatment and assignment of benefits for services provided during this visit. Patient/Guardian expressed understanding and agreed to proceed.    I provided 15 minutes of non-face-to-face time during this encounter.   Kathlee Nations, MD

## 2021-11-07 ENCOUNTER — Telehealth (HOSPITAL_COMMUNITY): Payer: Self-pay | Admitting: Licensed Clinical Social Worker

## 2021-11-07 ENCOUNTER — Ambulatory Visit (INDEPENDENT_AMBULATORY_CARE_PROVIDER_SITE_OTHER): Payer: Federal, State, Local not specified - PPO | Admitting: Licensed Clinical Social Worker

## 2021-11-07 DIAGNOSIS — Z91199 Patient's noncompliance with other medical treatment and regimen due to unspecified reason: Secondary | ICD-10-CM

## 2021-11-07 NOTE — Telephone Encounter (Signed)
LCSW counselor attempted to connect with patient for scheduled appointment via MyChart video text request x 2 and email request with no response; also attempted to connect via phone without success. LCSW counselor was not able to leave message due to voice mail box full.    Attempt 1: Text and email: 1:04p  Attempt 2: Text and email: 1:09p   Attempt 3: phone call: 1:05pm and 1:12pm.  Could NOT leave message due to voice mail box full. Video chat room left open until after 1:15p--pt did not log in.   Per [facility] policy, after multiple attempts to reach pt unsuccessfully at appointed time--visit will be coded as no show

## 2021-11-07 NOTE — Progress Notes (Signed)
LCSW counselor attempted to connect with patient for scheduled appointment via MyChart video text request x 2 and email request with no response; also attempted to connect via phone without success. LCSW counselor was not able to leave message due to voice mail box full.    Attempt 1: Text and email: 1:04p  Attempt 2: Text and email: 1:09p   Attempt 3: phone call: 1:05pm and 1:12pm.  Could NOT leave message due to voice mail box full. Video chat room left open until after 1:15p--pt did not log in.   Per [place] policy, after multiple attempts to reach pt unsuccessfully at appointed time--visit will be coded as no show  

## 2021-12-24 ENCOUNTER — Telehealth: Payer: Federal, State, Local not specified - PPO | Admitting: Nurse Practitioner

## 2021-12-24 ENCOUNTER — Other Ambulatory Visit: Payer: Self-pay

## 2021-12-24 ENCOUNTER — Emergency Department (HOSPITAL_BASED_OUTPATIENT_CLINIC_OR_DEPARTMENT_OTHER)
Admission: EM | Admit: 2021-12-24 | Discharge: 2021-12-24 | Disposition: A | Discharge status: Home / Self Care | Payer: Federal, State, Local not specified - PPO | Attending: Emergency Medicine | Admitting: Emergency Medicine

## 2021-12-24 DIAGNOSIS — Z7982 Long term (current) use of aspirin: Secondary | ICD-10-CM | POA: Diagnosis not present

## 2021-12-24 DIAGNOSIS — M791 Myalgia, unspecified site: Secondary | ICD-10-CM | POA: Diagnosis not present

## 2021-12-24 DIAGNOSIS — B349 Viral infection, unspecified: Secondary | ICD-10-CM | POA: Diagnosis not present

## 2021-12-24 DIAGNOSIS — Z1152 Encounter for screening for COVID-19: Secondary | ICD-10-CM | POA: Insufficient documentation

## 2021-12-24 LAB — RESP PANEL BY RT-PCR (RSV, FLU A&B, COVID)  RVPGX2
Influenza A by PCR: NEGATIVE
Influenza B by PCR: NEGATIVE
Resp Syncytial Virus by PCR: NEGATIVE
SARS Coronavirus 2 by RT PCR: NEGATIVE

## 2021-12-24 NOTE — ED Provider Notes (Signed)
[facility] EMERGENCY DEPT Provider Note   CSN: [id number] Arrival date & time: 12/24/21  1839     History  No chief complaint on file.   Morgan Oneal is a 36 y.o. female here with headaches, body aches, flulike symptoms ongoing for 2 days.  All 4 of the children were present with her in ED also of similar symptoms and have tested positive for COVID or flu.  HPI     Home Medications Prior to Admission medications   Medication Sig Start Date End Date Taking? Authorizing Provider  albuterol (VENTOLIN HFA) 108 (90 Base) MCG/ACT inhaler Inhale 2 puffs into the lungs every 6 (six) hours as needed for wheezing or shortness of breath. 03/17/21   Berniece Salines, FNP  ARIPiprazole (ABILIFY) 2 MG tablet Take one tab daily 11/01/21   Arfeen, Phillips Grout, MD  aspirin-acetaminophen-caffeine (EXCEDRIN MIGRAINE) [phone] MG tablet Take 1 tablet by mouth every 6 (six) hours as needed for headache.    [provider]  atomoxetine (STRATTERA) 60 MG capsule Take 1 capsule (60 mg total) by mouth daily. 11/01/21   Arfeen, Phillips Grout, MD  benzonatate (TESSALON) 100 MG capsule Take 1 capsule (100 mg total) by mouth 2 (two) times daily as needed for cough. Patient not taking: Reported on 11/01/2021 09/20/21   Berniece Salines, FNP  omeprazole (PRILOSEC) 20 MG capsule Take 1 capsule (20 mg total) by mouth daily. 03/17/21   Berniece Salines, FNP  traZODone (DESYREL) 100 MG tablet Take 1 tablet by mouth at bedtime as needed for sleep. Rarely needed 08/23/20   Arfeen, Phillips Grout, MD      Allergies    Mucinex [guaifenesin er] and Penicillins    Review of Systems   Review of Systems  Physical Exam Updated Vital Signs BP 105/87   Pulse 76   Temp 98.9 F (37.2 C)   Resp 16   LMP  (LMP Unknown)   SpO2 99%  Physical Exam Constitutional:      General: She is not in acute distress. HENT:     Head: Normocephalic and atraumatic.  Eyes:     Conjunctiva/sclera: Conjunctivae normal.     Pupils:  Pupils are equal, round, and reactive to light.  Cardiovascular:     Rate and Rhythm: Normal rate and regular rhythm.  Pulmonary:     Effort: Pulmonary effort is normal. No respiratory distress.  Skin:    General: Skin is warm and dry.  Neurological:     General: No focal deficit present.     Mental Status: She is alert. Mental status is at baseline.  Psychiatric:        Mood and Affect: Mood normal.        Behavior: Behavior normal.     ED Results / Procedures / Treatments   Labs (all labs ordered are listed, but only abnormal results are displayed) Labs Reviewed  RESP PANEL BY RT-PCR (RSV, FLU A&B, COVID)  [id number]    EKG None  Radiology No results found.  Procedures Procedures    Medications Ordered in ED Medications - No data to display  ED Course/ Medical Decision Making/ A&P                           Medical Decision Making  Patient was here with suspected viral syndrome.  Although she tested positive for COVID and flu, all 4 of her children are also present to have tested positive  for COVID or flu.  Therefore I suspect the patient very likely has 1 of these virus with a false negative result here in the ED.  She is not in respiratory distress and she is stable for discharge home and outpatient management.        Final Clinical Impression(s) / ED Diagnoses Final diagnoses:  Viral illness    Rx / DC Orders ED Discharge Orders     None         Terald Sleeper, MD 12/24/21 2124

## 2021-12-24 NOTE — ED Triage Notes (Signed)
Pt w flu-like symptoms, sick contact (kids)

## 2021-12-29 ENCOUNTER — Telehealth: Payer: Federal, State, Local not specified - PPO | Admitting: Urgent Care

## 2021-12-29 ENCOUNTER — Telehealth: Payer: Federal, State, Local not specified - PPO | Admitting: Family Medicine

## 2021-12-29 DIAGNOSIS — R6889 Other general symptoms and signs: Secondary | ICD-10-CM

## 2021-12-29 MED ORDER — BENZONATATE 100 MG PO CAPS: 100.0000 mg | ORAL_CAPSULE | Freq: Three times a day (TID) | ORAL | 0 refills | Status: DC | PRN

## 2021-12-29 NOTE — Patient Instructions (Signed)
The patient no-showed for appointment despite this provider sending direct link, reaching out via phone with no response and waiting for at least 10 minutes from appointment time for patient to join. They will be marked as a NS for this appointment/time. It appears patient submitted an evisit for the same thing earlier in the day.  Edia Pursifull L Tyniya Kuyper, PA     

## 2021-12-29 NOTE — Progress Notes (Signed)
E visit for Flu like symptoms   We are sorry that you are not feeling well.  Here is how we plan to help! Based on what you have shared with me it looks like you may have flu-like symptoms that should be watched but do not seem to indicate anti-viral treatment.  Influenza or "the flu" is   an infection caused by a respiratory virus. The flu virus is highly contagious and persons who did not receive their yearly flu vaccination may "catch" the flu from close contact.  Use tylenol or motrin for fevers, aches and pains. I will order you cough perles to help with cough- you can also use OTC Delsym and or another cough syrup.  Based upon your symptoms and potential risk factors I recommend that you follow the flu symptoms recommendation that I have listed below.  ANYONE WHO HAS FLU SYMPTOMS SHOULD: Stay home. The flu is highly contagious and going out or to work exposes others! Be sure to drink plenty of fluids. Water is fine as well as fruit juices, sodas and electrolyte beverages. You may want to stay away from caffeine or alcohol. If you are nauseated, try taking small sips of liquids. How do you know if you are getting enough fluid? Your urine should be a pale yellow or almost colorless. Get rest. Taking a steamy shower or using a humidifier may help nasal congestion and ease sore throat pain. Using a saline nasal spray works much the same way. Cough drops, hard candies and sore throat lozenges may ease your cough. Line up a caregiver. Have someone check on you regularly.   GET HELP RIGHT AWAY IF: You cannot keep down liquids or your medications. You become short of breath Your fell like you are going to pass out or loose consciousness. Your symptoms persist after you have completed your treatment plan MAKE SURE YOU  Understand these instructions. Will watch your condition. Will get help right away if you are not doing well or get worse.  Your e-visit answers were reviewed by a board  certified advanced clinical practitioner to complete your personal care plan.  Depending on the condition, your plan could have included both over the counter or prescription medications.  If there is a problem please reply  once you have received a response from your provider.  Your safety is important to [place].  If you have drug allergies check your prescription carefully.    You can use MyChart to ask questions about today's visit, request a non-urgent call back, or ask for a work or school excuse for 24 hours related to this e-Visit. If it has been greater than 24 hours you will need to follow up with your provider, or enter a new e-Visit to address those concerns.  You will get an e-mail in the next two days asking about your experience.  I hope that your e-visit has been valuable and will speed your recovery. Thank you for using e-visits.  I provided 5 minutes of non face-to-face time during this encounter for chart review, medication and order placement, as well as and documentation.

## 2021-12-29 NOTE — Progress Notes (Signed)
The patient no-showed for appointment despite this provider sending direct link, reaching out via phone with no response and waiting for at least 10 minutes from appointment time for patient to join. They will be marked as a NS for this appointment/time. It appears patient submitted an evisit for the same thing earlier in the day.  Riata Ikeda L Orchid Glassberg, PA

## 2021-12-31 ENCOUNTER — Emergency Department (HOSPITAL_BASED_OUTPATIENT_CLINIC_OR_DEPARTMENT_OTHER)
Admission: EM | Admit: 2021-12-31 | Discharge: 2021-12-31 | Disposition: A | Discharge status: Home / Self Care | Payer: Federal, State, Local not specified - PPO | Attending: Emergency Medicine | Admitting: Emergency Medicine

## 2021-12-31 ENCOUNTER — Encounter (HOSPITAL_BASED_OUTPATIENT_CLINIC_OR_DEPARTMENT_OTHER): Payer: Self-pay | Admitting: Emergency Medicine

## 2021-12-31 ENCOUNTER — Other Ambulatory Visit: Payer: Self-pay

## 2021-12-31 DIAGNOSIS — J208 Acute bronchitis due to other specified organisms: Secondary | ICD-10-CM | POA: Diagnosis not present

## 2021-12-31 DIAGNOSIS — J101 Influenza due to other identified influenza virus with other respiratory manifestations: Secondary | ICD-10-CM | POA: Insufficient documentation

## 2021-12-31 DIAGNOSIS — Z7982 Long term (current) use of aspirin: Secondary | ICD-10-CM | POA: Diagnosis not present

## 2021-12-31 DIAGNOSIS — R059 Cough, unspecified: Secondary | ICD-10-CM | POA: Diagnosis not present

## 2021-12-31 DIAGNOSIS — J209 Acute bronchitis, unspecified: Secondary | ICD-10-CM | POA: Insufficient documentation

## 2021-12-31 DIAGNOSIS — Z1152 Encounter for screening for COVID-19: Secondary | ICD-10-CM | POA: Insufficient documentation

## 2021-12-31 DIAGNOSIS — H9201 Otalgia, right ear: Secondary | ICD-10-CM | POA: Diagnosis not present

## 2021-12-31 DIAGNOSIS — Z7951 Long term (current) use of inhaled steroids: Secondary | ICD-10-CM | POA: Insufficient documentation

## 2021-12-31 DIAGNOSIS — R110 Nausea: Secondary | ICD-10-CM | POA: Diagnosis not present

## 2021-12-31 DIAGNOSIS — J45909 Unspecified asthma, uncomplicated: Secondary | ICD-10-CM | POA: Insufficient documentation

## 2021-12-31 DIAGNOSIS — J111 Influenza due to unidentified influenza virus with other respiratory manifestations: Secondary | ICD-10-CM

## 2021-12-31 LAB — RESP PANEL BY RT-PCR (RSV, FLU A&B, COVID)  RVPGX2
Influenza A by PCR: NEGATIVE
Influenza B by PCR: POSITIVE — AB
Resp Syncytial Virus by PCR: NEGATIVE
SARS Coronavirus 2 by RT PCR: NEGATIVE

## 2021-12-31 MED ORDER — AEROCHAMBER PLUS FLO-VU MEDIUM MISC
1.0000 | Freq: Once | Status: AC
  Administered 2021-12-31: 1
  Filled 2021-12-31: qty 1

## 2021-12-31 MED ORDER — PREDNISONE 10 MG (21) PO TBPK: ORAL_TABLET | ORAL | 0 refills | Status: DC

## 2021-12-31 MED ORDER — PREDNISONE 50 MG PO TABS
60.0000 mg | ORAL_TABLET | Freq: Once | ORAL | Status: AC
  Administered 2021-12-31: 60 mg via ORAL
  Filled 2021-12-31: qty 1

## 2021-12-31 MED ORDER — ALBUTEROL SULFATE HFA 108 (90 BASE) MCG/ACT IN AERS: 2.0000 | INHALATION_SPRAY | RESPIRATORY_TRACT | Status: DC | PRN

## 2021-12-31 NOTE — ED Triage Notes (Signed)
POV from home with flu like symptoms, ear fullness, drainage into back of throat that is making her feel nauseous, SOB with walking. Sts she tested positive for the flu on Thursday. Amb to triage. A&O x 4.

## 2021-12-31 NOTE — ED Provider Notes (Signed)
[facility] EMERGENCY DEPT  Provider Note  CSN: [id number] Arrival date & time: 12/31/21 0254  History Chief Complaint  Patient presents with   Otalgia   Nausea    Morgan Oneal is a 36 y.o. female with history of asthma here for re-evaluation of flu-like symptoms ongoing for about a week. Here on 12/24 with multiple family members who tested positive for flu although at that time her swab was neg. She reports continued cough, nasal congestion and ear pain. No fevers. Has been taking OTC meds with minimal improvement. Has inhaler at home.    Home Medications Prior to Admission medications   Medication Sig Start Date End Date Taking? Authorizing Provider  predniSONE (STERAPRED UNI-PAK 21 TAB) 10 MG (21) TBPK tablet 10mg  Tabs, 6 day taper. Use as directed 12/31/21  Yes 01/02/22, MD  albuterol (VENTOLIN HFA) 108 (90 Base) MCG/ACT inhaler Inhale 2 puffs into the lungs every 6 (six) hours as needed for wheezing or shortness of breath. 03/17/21   03/19/21, FNP  ARIPiprazole (ABILIFY) 2 MG tablet Take one tab daily 11/01/21   Arfeen, 13/1/23, MD  aspirin-acetaminophen-caffeine (EXCEDRIN MIGRAINE) [id number] MG tablet Take 1 tablet by mouth every 6 (six) hours as needed for headache.    [provider]  atomoxetine (STRATTERA) 60 MG capsule Take 1 capsule (60 mg total) by mouth daily. 11/01/21   Arfeen, 13/1/23, MD  benzonatate (TESSALON) 100 MG capsule Take 1 capsule (100 mg total) by mouth 3 (three) times daily as needed for cough. 12/29/21   12/31/21, NP  omeprazole (PRILOSEC) 20 MG capsule Take 1 capsule (20 mg total) by mouth daily. 03/17/21   03/19/21, FNP  traZODone (DESYREL) 100 MG tablet Take 1 tablet by mouth at bedtime as needed for sleep. Rarely needed 08/23/20   Arfeen, 08/25/20, MD     Allergies    Mucinex [guaifenesin er] and Penicillins   Review of Systems   Review of Systems Please see HPI for pertinent positives and  negatives  Physical Exam BP 123/87   Pulse 88   Temp 97.7 F (36.5 C) (Oral)   Resp 18   Wt 105.2 kg   LMP  (LMP Unknown)   SpO2 98%   BMI 36.32 kg/m   Physical Exam Vitals and nursing note reviewed.  Constitutional:      Appearance: Normal appearance.  HENT:     Head: Normocephalic and atraumatic.     Right Ear: Tympanic membrane normal.     Left Ear: Tympanic membrane normal.     Ears:     Comments: Bilateral cerumen    Nose: Nose normal.     Mouth/Throat:     Mouth: Mucous membranes are moist.  Eyes:     Extraocular Movements: Extraocular movements intact.     Conjunctiva/sclera: Conjunctivae normal.  Cardiovascular:     Rate and Rhythm: Normal rate.  Pulmonary:     Effort: Pulmonary effort is normal.     Breath sounds: Normal breath sounds. No wheezing or rales.     Comments: Harsh cough Abdominal:     General: Abdomen is flat.     Palpations: Abdomen is soft.     Tenderness: There is no abdominal tenderness.  Musculoskeletal:        General: No swelling. Normal range of motion.     Cervical back: Neck supple.  Skin:    General: Skin is warm and dry.  Neurological:  General: No focal deficit present.     Mental Status: She is alert.  Psychiatric:        Mood and Affect: Mood normal.     ED Results / Procedures / Treatments   EKG None  Procedures Procedures  Medications Ordered in the ED Medications  albuterol (VENTOLIN HFA) 108 (90 Base) MCG/ACT inhaler 2 puff (has no administration in time range)  predniSONE (DELTASONE) tablet 60 mg (has no administration in time range)  AeroChamber Plus Flo-Vu Medium MISC 1 each (1 each Other Given 12/31/21 0510)    Initial Impression and Plan  Patient here with continued viral syndrome, now likely a bronchitis >7 days from onset, flu is positive today. Advised Abx not indicated for viral infections but she may get some improvement with prednisone. Continue inhaler as needed. OTC symptomatic meds and PCP  follow up.   ED Course       MDM Rules/Calculators/A&P Medical Decision Making Problems Addressed: Acute bronchitis due to other specified organisms: acute illness or injury Influenza: acute illness or injury  Amount and/or Complexity of Data Reviewed Labs: ordered. Decision-making details documented in ED Course.  Risk Prescription drug management.    Final Clinical Impression(s) / ED Diagnoses Final diagnoses:  Acute bronchitis due to other specified organisms  Influenza    Rx / DC Orders ED Discharge Orders          Ordered    predniSONE (STERAPRED UNI-PAK 21 TAB) 10 MG (21) TBPK tablet        12/31/21 0534             Pollyann Savoy, MD 12/31/21 [phone]

## 2021-12-31 NOTE — ED Notes (Signed)
RT educated pt on proper use of MDI w/spacer. Pt able to perform w/out difficulty. Pt respiratory status stable w/no distress noted, BLBS clear/dim. Pt verbalizes understanding.

## 2022-01-23 ENCOUNTER — Telehealth: Payer: Federal, State, Local not specified - PPO | Admitting: Nurse Practitioner

## 2022-01-23 DIAGNOSIS — J111 Influenza due to unidentified influenza virus with other respiratory manifestations: Secondary | ICD-10-CM | POA: Diagnosis not present

## 2022-01-23 MED ORDER — OSELTAMIVIR PHOSPHATE 75 MG PO CAPS: 75.0000 mg | ORAL_CAPSULE | Freq: Two times a day (BID) | ORAL | 0 refills | Status: AC

## 2022-01-23 NOTE — Progress Notes (Signed)
E visit for Flu like symptoms   We are sorry that you are not feeling well.  Here is how we plan to help! Based on what you have shared with me it looks like you may have a respiratory virus that may be influenza.  Influenza or "the flu" is   an infection caused by a respiratory virus. The flu virus is highly contagious and persons who did not receive their yearly flu vaccination may "catch" the flu from close contact.  We have anti-viral medications to treat the viruses that cause this infection. They are not a "cure" and only shorten the course of the infection. These prescriptions are most effective when they are given within the first 2 days of "flu" symptoms. Antiviral medication are indicated if you have a high risk of complications from the flu. You should  also consider an antiviral medication if you are in close contact with someone who is at risk. These medications can help patients avoid complications from the flu  but have side effects that you should know. Possible side effects from Tamiflu or oseltamivir include nausea, vomiting, diarrhea, dizziness, headaches, eye redness, sleep problems or other respiratory symptoms. You should not take Tamiflu if you have an allergy to oseltamivir or any to the ingredients in Tamiflu.  Based upon your symptoms and potential risk factors I have prescribed Oseltamivir (Tamiflu).  It has been sent to your designated pharmacy.  You will take one 75 mg capsule orally twice a day for the next 5 days.  ANYONE WHO HAS FLU SYMPTOMS SHOULD: . Stay home. The flu is highly contagious and going out or to work exposes others! . Be sure to drink plenty of fluids. Water is fine as well as fruit juices, sodas and electrolyte beverages. You may want to stay away from caffeine or alcohol. If you are nauseated, try taking small sips of liquids. How do you know if you are getting enough fluid? Your urine should be a pale yellow or almost colorless. . Get rest. . Taking a  steamy shower or using a humidifier may help nasal congestion and ease sore throat pain. Using a saline nasal spray works much the same way. . Cough drops, hard candies and sore throat lozenges may ease your cough. . Line up a caregiver. Have someone check on you regularly.   GET HELP RIGHT AWAY IF: . You cannot keep down liquids or your medications. . You become short of breath . Your fell like you are going to pass out or loose consciousness. . Your symptoms persist after you have completed your treatment plan MAKE SURE YOU   Understand these instructions.  Will watch your condition.  Will get help right away if you are not doing well or get worse.  Your e-visit answers were reviewed by a board certified advanced clinical practitioner to complete your personal care plan.  Depending on the condition, your plan could have included both over the counter or prescription medications.  If there is a problem please reply  once you have received a response from your provider.  Your safety is important to us.  If you have drug allergies check your prescription carefully.    You can use MyChart to ask questions about today's visit, request a non-urgent call back, or ask for a work or school excuse for 24 hours related to this e-Visit. If it has been greater than 24 hours you will need to follow up with your provider, or enter a new e-Visit   two days asking about your experience.  I hope that your e-visit has been valuable and will speed your recovery. Thank you for using e-visits.   Meds ordered this encounter  Medications   oseltamivir (TAMIFLU) 75 MG capsule    Sig: Take 1 capsule (75 mg total) by mouth 2 (two) times daily for 5 days.    Dispense:  10 capsule    Refill:  0     I spent approximately 5 minutes reviewing the patient's history, current symptoms and coordinating their care today.   

## 2022-01-24 ENCOUNTER — Other Ambulatory Visit: Payer: Self-pay

## 2022-01-24 ENCOUNTER — Encounter: Payer: Self-pay | Admitting: Nurse Practitioner

## 2022-01-24 ENCOUNTER — Ambulatory Visit (INDEPENDENT_AMBULATORY_CARE_PROVIDER_SITE_OTHER): Payer: Federal, State, Local not specified - PPO | Admitting: Nurse Practitioner

## 2022-01-24 VITALS — BP 112/84 | HR 97 | Temp 98.1°F | Resp 18 | Ht 67.0 in | Wt 228.8 lb

## 2022-01-24 DIAGNOSIS — R059 Cough, unspecified: Secondary | ICD-10-CM | POA: Diagnosis not present

## 2022-01-24 DIAGNOSIS — R110 Nausea: Secondary | ICD-10-CM

## 2022-01-24 DIAGNOSIS — J069 Acute upper respiratory infection, unspecified: Secondary | ICD-10-CM

## 2022-01-24 DIAGNOSIS — R509 Fever, unspecified: Secondary | ICD-10-CM

## 2022-01-24 LAB — POCT INFLUENZA A/B
Influenza A, POC: NEGATIVE
Influenza B, POC: NEGATIVE

## 2022-01-24 MED ORDER — ONDANSETRON 4 MG PO TBDP: 4.0000 mg | ORAL_TABLET | Freq: Three times a day (TID) | ORAL | 0 refills | Status: DC | PRN

## 2022-01-24 MED ORDER — PROMETHAZINE-DM 6.25-15 MG/5ML PO SYRP: 5.0000 mL | ORAL_SOLUTION | Freq: Four times a day (QID) | ORAL | 0 refills | Status: DC | PRN

## 2022-01-24 NOTE — Progress Notes (Signed)
BP 112/84   Pulse 97   Temp 98.1 F (36.7 C) (Oral)   Resp 18   Ht 5\' 7"  (1.702 m)   Wt 228 lb 12.8 oz (103.8 kg)   LMP  (LMP Unknown)   SpO2 98%   BMI 35.84 kg/m    Subjective:    Patient ID: Morgan Oneal, female    DOB: 01-31-1985, 37 y.o.   MRN: [id number]  HPI: Morgan Oneal is a 37 y.o. female  Chief Complaint  Patient presents with   Influenza    Bodyaches, chills, weak, nausea for 3 days   URI/nausea: symptoms started Monday night.  She says symptoms include body aches, chills, generalized weakness, nausea, fever, vomiting, headache, cough.  She denies any shortness of breath or abdominal pain.  She says she has had the cough since last month when she had the flu B.  She had a virtual visit yesterday and was prescribed tamiflu.  Patient has not picked up the prescription.  She did a home covid test which was negative.  We did a rapid flu which was negative and covid pcr which is pending.  Discussed side effects and if positive for covid will send in paxlovid.  Discussed with patient that her symptoms are consistent with the stomach flu.  Will give prescription for zofran.  Push fluids ( especially those with electrolytes ) follow the BRAT diet.    Relevant past medical, surgical, family and social history reviewed and updated as indicated. Interim medical history since our last visit reviewed. Allergies and medications reviewed and updated.  Review of Systems  Constitutional: positive for fever,  weight change.  Respiratory: positive for cough and negative for  shortness of breath.   Cardiovascular: Negative for chest pain or palpitations.  Gastrointestinal: Negative for abdominal pain, diarrhea Musculoskeletal: Negative for gait problem or joint swelling.  Skin: Negative for rash.  Neurological: Negative for dizziness , positive for  headache.  No other specific complaints in a complete review of systems (except as listed in HPI above).      Objective:     BP 112/84   Pulse 97   Temp 98.1 F (36.7 C) (Oral)   Resp 18   Ht 5\' 7"  (1.702 m)   Wt 228 lb 12.8 oz (103.8 kg)   LMP  (LMP Unknown)   SpO2 98%   BMI 35.84 kg/m   Wt Readings from Last 3 Encounters:  01/24/22 228 lb 12.8 oz (103.8 kg)  12/31/21 231 lb 14.8 oz (105.2 kg)  09/20/21 244 lb 3.2 oz (110.8 kg)    Physical Exam  Constitutional: Patient appears well-developed and well-nourished. Obese  No distress.  HEENT: head atraumatic, normocephalic, pupils equal and reactive to light, neck supple, throat within normal limits Cardiovascular: Normal rate, regular rhythm and normal heart sounds.  No murmur heard. No BLE edema. Pulmonary/Chest: Effort normal and breath sounds normal. No respiratory distress. Abdominal: Soft.  There is no tenderness. Psychiatric: Patient has a normal mood and affect. behavior is normal. Judgment and thought content normal.     Assessment & Plan:   Problem List Items Addressed This Visit   None Visit Diagnoses     Viral upper respiratory tract infection    -  Primary   awaiting covid pcr result, will send in paxlovid if positve, can also take phenergan DM   Relevant Medications   promethazine-dextromethorphan (PROMETHAZINE-DM) 6.25-15 MG/5ML syrup   Other Relevant Orders   POCT Influenza A/B (Completed)  Novel Coronavirus, NAA ([facility])   Nausea       can take zofran for nausea, push fluids (with electrolytes) follow the BRAT diet   Relevant Medications   ondansetron (ZOFRAN-ODT) 4 MG disintegrating tablet        Follow up plan: Return if symptoms worsen or fail to improve.

## 2022-01-25 LAB — NOVEL CORONAVIRUS, NAA: SARS-CoV-2, NAA: NOT DETECTED

## 2022-01-31 ENCOUNTER — Telehealth (HOSPITAL_COMMUNITY): Payer: Self-pay | Admitting: Psychiatry

## 2022-01-31 ENCOUNTER — Encounter (HOSPITAL_COMMUNITY): Payer: Self-pay | Admitting: Psychiatry

## 2022-01-31 ENCOUNTER — Encounter: Payer: Self-pay | Admitting: Nurse Practitioner

## 2022-01-31 ENCOUNTER — Telehealth (HOSPITAL_BASED_OUTPATIENT_CLINIC_OR_DEPARTMENT_OTHER): Payer: Federal, State, Local not specified - PPO | Admitting: Psychiatry

## 2022-01-31 VITALS — Wt 228.0 lb

## 2022-01-31 DIAGNOSIS — F319 Bipolar disorder, unspecified: Secondary | ICD-10-CM

## 2022-01-31 DIAGNOSIS — F902 Attention-deficit hyperactivity disorder, combined type: Secondary | ICD-10-CM | POA: Diagnosis not present

## 2022-01-31 MED ORDER — LAMOTRIGINE 25 MG PO TABS: ORAL_TABLET | ORAL | 0 refills | Status: DC

## 2022-01-31 NOTE — Telephone Encounter (Signed)
Dr Adele Schilder wants this patient signed up for IOP.

## 2022-01-31 NOTE — Progress Notes (Signed)
Virtual Visit via Video Note  I connected with Morgan Oneal on 01/31/22 at 10:00 AM EST by a video enabled telemedicine application and verified that I am speaking with the correct person using two identifiers.  Location: Patient: Work Provider: Biomedical scientist   I discussed the limitations of evaluation and management by telemedicine and the availability of in person appointments. The patient expressed understanding and agreed to proceed.  History of Present Illness: Patient is evaluated by video session.  She is at work.  Patient reported that she had stopped the Strattera and Abilify because she was having a lot of emotions and her mood is noticeable down.  She did not inform and decided to stop on her own.  She also not consistent with the therapy because she felt that she need more frequent visits with the therapist.  She also admitted missing the appointment with [facility].  Patient admitted continued to have anger issues.  Last night she had a arguments with her 28 year old daughter and she told her father to pick her up today from the school.  She decided not to keep her anymore.  Patient has 2 other children.  Patient reported not taking the medication and she feels sometime withdrawn, isolated and stayed to herself.  She denies any suicidal thoughts.  She is taking classes at [employer] and one of her classes in person.  Patient works for [employer].  She told job sometimes is challenging.  She had a COVID around holidays but now feeling better.  She had requested FMLA and we have provided to take maximum 2 days in 30 days but patient feels that she need more days because she has flareup of her symptoms.  She reported her classes are going okay sometimes she struggle but able to manage.  She denies any hallucination or any paranoia.  Past Psychiatric History: H/O ADHD and given Adderall but no details. H/O cutting herself when parents going through divorce.  No h/o inpatient, suicidal  attempt, psychosis, mood swings, highs and lows, anger, irritability.  H/O hevay ETOH.     Psychiatric Specialty Exam: Physical Exam  Review of Systems  Weight 228 lb (103.4 kg).There is no height or weight on file to calculate BMI.  General Appearance: Casual  Eye Contact:  Fair  Speech:  Slow  Volume:  Normal  Mood:  Depressed and Dysphoric  Affect:  Congruent  Thought Process:  Goal Directed  Orientation:  Full (Time, Place, and Person)  Thought Content:  Rumination  Suicidal Thoughts:  No  Homicidal Thoughts:  No  Memory:  Immediate;   Good Recent;   Good Remote;   Good  Judgement:  Fair  Insight:  Shallow  Psychomotor Activity:  Increased  Concentration:  Concentration: Good and Attention Span: Good  Recall:  Good  Fund of Knowledge:  Good  Language:  Good  Akathisia:  No  Handed:  Right  AIMS (if indicated):     Assets:  Communication Skills Desire for Improvement Housing Talents/Skills Transportation  ADL's:  Intact  Cognition:  WNL  Sleep:   fair      Assessment and Plan: Bipolar disorder type I.  ADHD, combined type.  Patient stopped taking the Abilify and Strattera.  It is unclear as last visit she felt doing better but now she feels it was causing more emotional.  Patient willing to try a different medication and after some discussion she agreed to give a try to Lamictal.  We discussed about FMLA as  patient like to have more days and I recommend should consider IOP if she feels having difficulty controlling her emotions.  Patient like to get more information about IOP.  We will refer to the program coordinator.  She will start the Lamictal 25 mg daily for 1 week and then 50 mg daily.  I explained medicine side effect specially if she developed a rash then she need to stop the medication immediately.  Recommended to call us back if she has any question or any concern.  We will follow-up in 3 to 4 weeks.  Follow Up Instructions:    I discussed the assessment  and treatment plan with the patient. The patient was provided an opportunity to ask questions and all were answered. The patient agreed with the plan and demonstrated an understanding of the instructions.   The patient was advised to call back or seek an in-person evaluation if the symptoms worsen or if the condition fails to improve as anticipated.  Collaboration of Care: Other provider involved in patient's care AEB notes are available in epic to review.  Patient/Guardian was advised Release of Information must be obtained prior to any record release in order to collaborate their care with an outside provider. Patient/Guardian was advised if they have not already done so to contact the registration department to sign all necessary forms in order for [place] to release information regarding their care.   Consent: Patient/Guardian gives verbal consent for treatment and assignment of benefits for services provided during this visit. Patient/Guardian expressed understanding and agreed to proceed.    I provided 28 minutes of non-face-to-face time during this encounter.   Kathlee Nations, MD

## 2022-01-31 NOTE — Telephone Encounter (Signed)
D:  Dr. Adele Schilder referred pt to [facility].  A:  Placed call and oriented pt.  Pt is declining MH-IOP at this time.  Encouraged pt to give the case manager a call if she changes her mind.  Recommended pt to follow up with Dr. Adele Schilder and Margreta Journey Hussami, LCSW.  Inform Dr. Adele Schilder.  R:  Pt receptive.

## 2022-02-25 ENCOUNTER — Other Ambulatory Visit (HOSPITAL_COMMUNITY): Payer: Self-pay | Admitting: Psychiatry

## 2022-02-25 DIAGNOSIS — F319 Bipolar disorder, unspecified: Secondary | ICD-10-CM

## 2022-02-25 DIAGNOSIS — F902 Attention-deficit hyperactivity disorder, combined type: Secondary | ICD-10-CM

## 2022-03-20 ENCOUNTER — Ambulatory Visit (INDEPENDENT_AMBULATORY_CARE_PROVIDER_SITE_OTHER): Payer: Federal, State, Local not specified - PPO | Admitting: Physician Assistant

## 2022-03-20 ENCOUNTER — Encounter: Payer: Self-pay | Admitting: Physician Assistant

## 2022-03-20 VITALS — BP 112/78 | HR 92 | Temp 98.2°F | Resp 16 | Ht 67.0 in | Wt 229.3 lb

## 2022-03-20 DIAGNOSIS — J069 Acute upper respiratory infection, unspecified: Secondary | ICD-10-CM | POA: Diagnosis not present

## 2022-03-20 DIAGNOSIS — J029 Acute pharyngitis, unspecified: Secondary | ICD-10-CM | POA: Diagnosis not present

## 2022-03-20 DIAGNOSIS — R059 Cough, unspecified: Secondary | ICD-10-CM

## 2022-03-20 DIAGNOSIS — R52 Pain, unspecified: Secondary | ICD-10-CM | POA: Diagnosis not present

## 2022-03-20 LAB — POCT INFLUENZA A/B
Influenza A, POC: NEGATIVE
Influenza B, POC: NEGATIVE

## 2022-03-20 LAB — POCT RAPID STREP A (OFFICE): Rapid Strep A Screen: NEGATIVE

## 2022-03-20 MED ORDER — BENZONATATE 100 MG PO CAPS: 100.0000 mg | ORAL_CAPSULE | Freq: Two times a day (BID) | ORAL | 0 refills | Status: DC | PRN

## 2022-03-20 MED ORDER — PREDNISONE 20 MG PO TABS: ORAL_TABLET | ORAL | 0 refills | Status: DC

## 2022-03-20 MED ORDER — IPRATROPIUM BROMIDE 0.06 % NA SOLN: 2.0000 | Freq: Four times a day (QID) | NASAL | 12 refills | Status: AC

## 2022-03-20 NOTE — Patient Instructions (Addendum)
Based on your described symptoms and the duration of symptoms it is likely that you have a viral upper respiratory infection (often called a "cold")  Symptoms can last for 3-10 days with lingering cough and intermittent symptoms lasting weeks after that.  The goal of treatment at this time is to reduce your symptoms and discomfort   I have sent in Tessalon pearls for you to take twice per day to help with your cough  Please make sure you are staying well hydrated  We will keep you updated on your COVID results once they come back.   If your symptoms do not improve or become worse in the next 5-7 days please make an apt at the office so we can see you  Go to the ER if you begin to have more serious symptoms such as shortness of breath, trouble breathing, loss of consciousness, swelling around the eyes, high fever, severe lasting headaches, vision changes or neck pain/stiffness.

## 2022-03-20 NOTE — Progress Notes (Signed)
Acute Office Visit   Patient: Morgan Oneal   DOB: [birth year]   37 y.o. Female  MRN: [id number] Visit Date: 03/20/2022  Today's healthcare provider: Dani Gobble Elinda Bunten, PA-C  Introduced myself to the patient as a Journalist, newspaper and provided education on APPs in clinical practice.    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   aspirin-acetaminophen-caffeine (EXCEDRIN MIGRAINE) 250-250-65 MG tablet Take 1 tablet by mouth every 6 (six) hours as needed for headache.   lamoTRIgine (LAMICTAL) 25 MG tablet Take one tab daily for one week and than two tab daily   omeprazole (PRILOSEC) 20 MG capsule Take 1 capsule (20 mg total) by mouth daily.   ondansetron (ZOFRAN-ODT) 4 MG disintegrating tablet Take 1 tablet (4 mg total) by mouth every 8 (eight) hours as needed for nausea or vomiting.   promethazine-dextromethorphan (PROMETHAZINE-DM) 6.25-15 MG/5ML syrup Take 5 mLs by mouth 4 (four) times daily as needed for cough.   traZODone (DESYREL) 100 MG tablet Take 1 tablet by mouth at bedtime as needed for sleep. Rarely needed   ARIPiprazole  (ABILIFY) 2 MG tablet Take one tab daily (Patient not taking: Reported on 01/31/2022)   atomoxetine (STRATTERA) 60 MG capsule Take 1 capsule (60 mg total) by mouth daily. (Patient not taking: Reported on 01/31/2022)   [DISCONTINUED] benzonatate (TESSALON) 100 MG capsule Take 1 capsule (100 mg total) by mouth 3 (three) times daily as needed for cough. (Patient not taking: Reported on 01/24/2022)   [DISCONTINUED] predniSONE (STERAPRED UNI-PAK 21 TAB) 10 MG (21) TBPK tablet 10mg  Tabs, 6 day taper. Use as directed (Patient not taking: Reported on 01/24/2022)   No facility-administered medications prior to visit.    Review of Systems  Constitutional:  Negative for chills and fever.  HENT:  Positive for congestion, ear pain, rhinorrhea, sore throat and voice change. Negative for postnasal drip, sinus pressure and sinus pain.   Respiratory:  Positive for cough. Negative for shortness of breath and wheezing.   Gastrointestinal:  Negative for diarrhea, nausea and vomiting.  Musculoskeletal:  Positive for myalgias.  Neurological:  Positive for headaches.       Objective    BP 112/78   Pulse 92   Temp 98.2 F (36.8 C) (Oral)   Resp 16   Ht 5\' 7"  (1.702 m)   Wt 229 lb 4.8 oz (104 kg)   LMP  (LMP Unknown)   SpO2 99%   BMI 35.91 kg/m    Physical Exam Vitals reviewed.  Constitutional:      General: She  is awake.     Appearance: Normal appearance. She is well-developed.  HENT:     Head: Normocephalic and atraumatic.     Right Ear: Hearing, tympanic membrane, ear canal and external ear normal.     Left Ear: Hearing, tympanic membrane, ear canal and external ear normal.     Mouth/Throat:     Mouth: Mucous membranes are moist.     Pharynx: Oropharynx is clear. Uvula midline. Posterior oropharyngeal erythema present. No pharyngeal swelling or oropharyngeal exudate.     Tonsils: No tonsillar exudate or tonsillar abscesses.  Eyes:     Conjunctiva/sclera: Conjunctivae normal.     Pupils: Pupils  are equal, round, and reactive to light.  Neck:     Comments: Patient with extremely hoarse voice - barely able to speak above whisper Tenderness over submandibular salivary glands and submental lymph nodes  No adenopathy palpated  Cardiovascular:     Rate and Rhythm: Normal rate and regular rhythm.     Heart sounds: Normal heart sounds. No murmur heard.    No friction rub. No gallop.  Pulmonary:     Effort: Pulmonary effort is normal. No respiratory distress.     Breath sounds: No wheezing, rhonchi or rales.  Musculoskeletal:     Cervical back: Normal range of motion and neck supple.  Lymphadenopathy:     Head:     Right side of head: No submental, submandibular or preauricular adenopathy.     Left side of head: No submental, submandibular or preauricular adenopathy.     Cervical: Cervical adenopathy present.     Right cervical: No superficial or posterior cervical adenopathy.    Left cervical: No superficial or posterior cervical adenopathy.  Skin:    General: Skin is warm.  Neurological:     General: No focal deficit present.     Mental Status: She is alert and oriented to person, place, and time.  Psychiatric:        Mood and Affect: Mood normal.        Behavior: Behavior normal. Behavior is cooperative.       Results for orders placed or performed in visit on 03/20/22  POCT rapid strep A  Result Value Ref Range   Rapid Strep A Screen Negative Negative  POCT Influenza A/B  Result Value Ref Range   Influenza A, POC Negative Negative   Influenza B, POC Negative Negative    Assessment & Plan      No follow-ups on file.      Problem List Items Addressed This Visit   None Visit Diagnoses     Viral upper respiratory tract infection    -  Primary Visit with patient indicates symptoms comprised of congestion, productive cough, sore throat, fatigue, headaches, myalgias,  since Thurs congruent with acute URI that is likely viral in nature  Rapid strep and Flu were  negative- results reviewed with patient during apt. COVID testing results pending. CENTOR score low- strep unlikely at this time.  Due to nature and duration of symptoms recommended treatment regimen is symptomatic relief and follow up if needed Discussed with patient the various viral and bacterial etiologies of current illness and appropriate course of treatment Will send in scripts for Prednisone, Tessalon, and Ipratropium nasal spray to assist with symptoms Discussed OTC medication options for symptom relief  Discussed return precautions if symptoms are not improving or worsen over next 5-7 days.     Relevant Medications   predniSONE (DELTASONE) 20 MG tablet  benzonatate (TESSALON) 100 MG capsule   ipratropium (ATROVENT) 0.06 % nasal spray   Sore throat       Relevant Medications   predniSONE (DELTASONE) 20 MG tablet   Other Relevant Orders   POCT rapid strep A (Completed)   Body aches       Relevant Orders   Novel Coronavirus, NAA ([facility])   POCT Influenza A/B (Completed)   Cough, unspecified type       Relevant Medications   predniSONE (DELTASONE) 20 MG tablet   benzonatate (TESSALON) 100 MG capsule   Other Relevant Orders   Novel Coronavirus, NAA ([facility])   POCT Influenza A/B (Completed)        No follow-ups on file.   I, Tarhonda Hollenberg E Neiva Maenza, PA-C, have reviewed all documentation for this visit. The documentation on 03/20/22 for the exam, diagnosis, procedures, and orders are all accurate and complete.   Talitha Givens, MHS, PA-C [facility]

## 2022-03-21 ENCOUNTER — Encounter: Payer: Self-pay | Admitting: Physician Assistant

## 2022-03-21 ENCOUNTER — Encounter: Payer: Federal, State, Local not specified - PPO | Admitting: Nurse Practitioner

## 2022-03-21 LAB — SPECIMEN STATUS REPORT

## 2022-03-21 LAB — NOVEL CORONAVIRUS, NAA: SARS-CoV-2, NAA: NOT DETECTED

## 2022-03-21 NOTE — Progress Notes (Signed)
Your testing results were negative for COVID, flu, strep

## 2022-03-22 ENCOUNTER — Encounter (HOSPITAL_COMMUNITY): Payer: Self-pay | Admitting: Psychiatry

## 2022-03-22 ENCOUNTER — Telehealth (HOSPITAL_BASED_OUTPATIENT_CLINIC_OR_DEPARTMENT_OTHER): Payer: Federal, State, Local not specified - PPO | Admitting: Psychiatry

## 2022-03-22 ENCOUNTER — Other Ambulatory Visit: Payer: Self-pay | Admitting: Nurse Practitioner

## 2022-03-22 DIAGNOSIS — F319 Bipolar disorder, unspecified: Secondary | ICD-10-CM | POA: Diagnosis not present

## 2022-03-22 DIAGNOSIS — F902 Attention-deficit hyperactivity disorder, combined type: Secondary | ICD-10-CM | POA: Diagnosis not present

## 2022-03-22 MED ORDER — LAMOTRIGINE 25 MG PO TABS: ORAL_TABLET | ORAL | 1 refills | Status: DC

## 2022-03-22 NOTE — Progress Notes (Signed)
[facility] MD Virtual Progress Note   Patient Location: Home Provider Location: Office  I connect with patient by video and verified that I am speaking with correct person by using two identifiers. I discussed the limitations of evaluation and management by telemedicine and the availability of in person appointments. I also discussed with the patient that there may be a patient responsible charge related to this service. The patient expressed understanding and agreed to proceed.  Morgan Oneal [id number] 37 y.o.  03/22/2022 10:11 AM  History of Present Illness:  Patient is evaluated by video session.  She is very congested and taking steroids and cough medicine.  She has hoarseness.  She is not taking Abilify and Strattera and we started Lamictal on the last visit.  We also recommended IOP but patient could not do due to conflict with her schedule.  She admitted that helps the mood lability and anger.  She still have a lot of challenges at her job but manageable.  She admitted feeling tired but could be due to congestion.  She has not submitted FMLA yet but like to do it in few days.  Patient is taking classes at [employer] and so far her grades are okay.  She noticed more motivation with the Lamictal and she does not feel as tearful or anger.  Her appetite is okay.  Her weight is stable.  Her attention and concentration is still sometimes challenging but manageable.  She has no rash, itching, tremors or shakes.  She is taking Lamictal 50 mg.  Past Psychiatric History: H/O ADHD and given Adderall but no details. H/O cutting herself when parents going through divorce.  No h/o inpatient, suicidal attempt, psychosis, mood swings, highs and lows, anger, irritability.  H/O hevay ETOH.  We tried Abilify and Strattera that did not work.   Outpatient Encounter Medications as of 03/22/2022  Medication Sig   albuterol (VENTOLIN HFA) 108 (90 Base) MCG/ACT inhaler Inhale 2 puffs  into the lungs every 6 (six) hours as needed for wheezing or shortness of breath.   ARIPiprazole (ABILIFY) 2 MG tablet Take one tab daily (Patient not taking: Reported on 01/31/2022)   aspirin-acetaminophen-caffeine (EXCEDRIN MIGRAINE) 250-250-65 MG tablet Take 1 tablet by mouth every 6 (six) hours as needed for headache.   atomoxetine (STRATTERA) 60 MG capsule Take 1 capsule (60 mg total) by mouth daily. (Patient not taking: Reported on 01/31/2022)   benzonatate (TESSALON) 100 MG capsule Take 1 capsule (100 mg total) by mouth 2 (two) times daily as needed for cough.   ipratropium (ATROVENT) 0.06 % nasal spray Place 2 sprays into both nostrils 4 (four) times daily.   lamoTRIgine (LAMICTAL) 25 MG tablet Take one tab daily for one week and than two tab daily   omeprazole (PRILOSEC) 20 MG capsule Take 1 capsule (20 mg total) by mouth daily.   ondansetron (ZOFRAN-ODT) 4 MG disintegrating tablet Take 1 tablet (4 mg total) by mouth every 8 (eight) hours as needed for nausea or vomiting.   predniSONE (DELTASONE) 20 MG tablet Take 60mg  PO daily x 2 days, then40mg  PO daily x 2 days, then 20mg  PO daily x 3 days   promethazine-dextromethorphan (PROMETHAZINE-DM) 6.25-15 MG/5ML syrup Take 5 mLs by mouth 4 (four) times daily as needed for cough.   traZODone (DESYREL) 100 MG tablet Take 1 tablet by mouth at bedtime as needed for sleep. Rarely needed   No facility-administered encounter medications on file as of 03/22/2022.    Recent Results (from  the past 2160 hour(s))  Resp panel by RT-PCR (RSV, Flu A&B, Covid) Anterior Nasal Swab     Status: None   Collection Time: 12/24/21  6:57 PM   Specimen: Anterior Nasal Swab  Result Value Ref Range   SARS Coronavirus 2 by RT PCR NEGATIVE NEGATIVE    Comment: (NOTE) SARS-CoV-2 target nucleic acids are NOT DETECTED.  The SARS-CoV-2 RNA is generally detectable in upper respiratory specimens during the acute phase of infection. The lowest concentration of SARS-CoV-2  viral copies this assay can detect is 138 copies/mL. A negative result does not preclude SARS-Cov-2 infection and should not be used as the sole basis for treatment or other patient management decisions. A negative result may occur with  improper specimen collection/handling, submission of specimen other than nasopharyngeal swab, presence of viral mutation(s) within the areas targeted by this assay, and inadequate number of viral copies(<138 copies/mL). A negative result must be combined with clinical observations, patient history, and epidemiological information. The expected result is Negative.  Fact Sheet for Patients:  EntrepreneurPulse.com.au  Fact Sheet for Healthcare Providers:  IncredibleEmployment.be  This test is no t yet approved or cleared by the [place] FDA and  has been authorized for detection and/or diagnosis of SARS-CoV-2 by FDA under an Emergency Use Authorization (EUA). This EUA will remain  in effect (meaning this test can be used) for the duration of the COVID-19 declaration under Section 564(b)(1) of the Act, 21 U.S.C.section 360bbb-3(b)(1), unless the authorization is terminated  or revoked sooner.       Influenza A by PCR NEGATIVE NEGATIVE   Influenza B by PCR NEGATIVE NEGATIVE    Comment: (NOTE) The Xpert Xpress SARS-CoV-2/FLU/RSV plus assay is intended as an aid in the diagnosis of influenza from Nasopharyngeal swab specimens and should not be used as a sole basis for treatment. Nasal washings and aspirates are unacceptable for Xpert Xpress SARS-CoV-2/FLU/RSV testing.  Fact Sheet for Patients: EntrepreneurPulse.com.au  Fact Sheet for Healthcare Providers: IncredibleEmployment.be  This test is not yet approved or cleared by the [place] FDA and has been authorized for detection and/or diagnosis of SARS-CoV-2 by FDA under an Emergency Use Authorization (EUA). This EUA  will remain in effect (meaning this test can be used) for the duration of the COVID-19 declaration under Section 564(b)(1) of the Act, 21 U.S.C. section 360bbb-3(b)(1), unless the authorization is terminated or revoked.     Resp Syncytial Virus by PCR NEGATIVE NEGATIVE    Comment: (NOTE) Fact Sheet for Patients: EntrepreneurPulse.com.au  Fact Sheet for Healthcare Providers: IncredibleEmployment.be  This test is not yet approved or cleared by the [place] FDA and has been authorized for detection and/or diagnosis of SARS-CoV-2 by FDA under an Emergency Use Authorization (EUA). This EUA will remain in effect (meaning this test can be used) for the duration of the COVID-19 declaration under Section 564(b)(1) of the Act, 21 U.S.C. section 360bbb-3(b)(1), unless the authorization is terminated or revoked.  Performed at [facility], [address] [place], [place] [address]   Resp panel by RT-PCR (RSV, Flu A&B, Covid) Anterior Nasal Swab     Status: Abnormal   Collection Time: 12/31/21  3:07 AM   Specimen: Anterior Nasal Swab  Result Value Ref Range   SARS Coronavirus 2 by RT PCR NEGATIVE NEGATIVE    Comment: (NOTE) SARS-CoV-2 target nucleic acids are NOT DETECTED.  The SARS-CoV-2 RNA is generally detectable in upper respiratory specimens during the acute phase of infection. The lowest concentration of SARS-CoV-2  viral copies this assay can detect is 138 copies/mL. A negative result does not preclude SARS-Cov-2 infection and should not be used as the sole basis for treatment or other patient management decisions. A negative result may occur with  improper specimen collection/handling, submission of specimen other than nasopharyngeal swab, presence of viral mutation(s) within the areas targeted by this assay, and inadequate number of viral copies(<138 copies/mL). A negative result must be combined with clinical  observations, patient history, and epidemiological information. The expected result is Negative.  Fact Sheet for Patients:  EntrepreneurPulse.com.au  Fact Sheet for Healthcare Providers:  IncredibleEmployment.be  This test is no t yet approved or cleared by the [place] FDA and  has been authorized for detection and/or diagnosis of SARS-CoV-2 by FDA under an Emergency Use Authorization (EUA). This EUA will remain  in effect (meaning this test can be used) for the duration of the COVID-19 declaration under Section 564(b)(1) of the Act, 21 U.S.C.section 360bbb-3(b)(1), unless the authorization is terminated  or revoked sooner.       Influenza A by PCR NEGATIVE NEGATIVE   Influenza B by PCR POSITIVE (A) NEGATIVE    Comment: (NOTE) The Xpert Xpress SARS-CoV-2/FLU/RSV plus assay is intended as an aid in the diagnosis of influenza from Nasopharyngeal swab specimens and should not be used as a sole basis for treatment. Nasal washings and aspirates are unacceptable for Xpert Xpress SARS-CoV-2/FLU/RSV testing.  Fact Sheet for Patients: EntrepreneurPulse.com.au  Fact Sheet for Healthcare Providers: IncredibleEmployment.be  This test is not yet approved or cleared by the [place] FDA and has been authorized for detection and/or diagnosis of SARS-CoV-2 by FDA under an Emergency Use Authorization (EUA). This EUA will remain in effect (meaning this test can be used) for the duration of the COVID-19 declaration under Section 564(b)(1) of the Act, 21 U.S.C. section 360bbb-3(b)(1), unless the authorization is terminated or revoked.     Resp Syncytial Virus by PCR NEGATIVE NEGATIVE    Comment: (NOTE) Fact Sheet for Patients: EntrepreneurPulse.com.au  Fact Sheet for Healthcare Providers: IncredibleEmployment.be  This test is not yet approved or cleared by the [place] FDA and has been authorized for detection and/or diagnosis of SARS-CoV-2 by FDA under an Emergency Use Authorization (EUA). This EUA will remain in effect (meaning this test can be used) for the duration of the COVID-19 declaration under Section 564(b)(1) of the Act, 21 U.S.C. section 360bbb-3(b)(1), unless the authorization is terminated or revoked.  Performed at [facility], [address], [place], [place] [address]   Novel Coronavirus, NAA ([facility])     Status: None   Collection Time: 01/24/22 12:00 AM   Specimen: Nasopharyngeal(NP) swabs in vial transport medium   Nasopharynge  Previous  Result Value Ref Range   SARS-CoV-2, NAA Not Detected Not Detected    Comment: This nucleic acid amplification test was developed and its performance characteristics determined by [employer], [employer] and [employer]. Nucleic acid amplification tests include RT-PCR and TMA. This test has not been FDA cleared or approved. This test has been authorized by FDA under an Emergency Use Authorization (EUA). This test is only authorized for the duration of time the declaration that circumstances exist justifying the authorization of the emergency use of in vitro diagnostic tests for detection of SARS-CoV-2 virus and/or diagnosis of COVID-19 infection under section 564(b)(1) of the Act, 21 U.S.C. [id number]) (1), unless the authorization is terminated or revoked sooner. When diagnostic testing is negative, the possibility of a false negative result should be considered in  the context of a patient's recent exposures and the presence of clinical signs and symptoms consistent with COVID-19. An individual without symptoms of COVID-19 and who is not shedding SARS-CoV-2 virus wo uld expect to have a negative (not detected) result in this assay.   POCT Influenza A/B     Status: None   Collection Time: 01/24/22 11:46 AM  Result Value Ref Range   Influenza A, POC Negative Negative   Influenza B,  POC Negative Negative  Novel Coronavirus, NAA ([facility])     Status: None   Collection Time: 03/20/22 12:00 AM   Specimen: Nasopharyngeal(NP) swabs in vial transport medium   Nasopharynge  Resident  Result Value Ref Range   SARS-CoV-2, NAA Not Detected Not Detected    Comment: This nucleic acid amplification test was developed and its performance characteristics determined by [employer], [employer]. Nucleic acid amplification tests include RT-PCR and TMA. This test has not been FDA cleared or approved. This test has been authorized by FDA under an Emergency Use Authorization (EUA). This test is only authorized for the duration of time the declaration that circumstances exist justifying the authorization of the emergency use of in vitro diagnostic tests for detection of SARS-CoV-2 virus and/or diagnosis of COVID-19 infection under section 564(b)(1) of the Act, 21 U.S.C. [id number]) (1), unless the authorization is terminated or revoked sooner. When diagnostic testing is negative, the possibility of a false negative result should be considered in the context of a patient's recent exposures and the presence of clinical signs and symptoms consistent with COVID-19. An individual without symptoms of COVID-19 and who is not shedding SARS-CoV-2 virus wo uld expect to have a negative (not detected) result in this assay.   Specimen status report     Status: None   Collection Time: 03/20/22 12:00 AM  Result Value Ref Range   specimen status report Comment     Comment: Please note Please note The date and/or time of collection was not indicated on the requisition as required by state and federal law.  The date of receipt of the specimen was used as the collection date if not supplied.   POCT rapid strep A     Status: None   Collection Time: 03/20/22 10:53 AM  Result Value Ref Range   Rapid Strep A Screen Negative Negative  POCT Influenza A/B     Status: None   Collection Time: 03/20/22  10:54 AM  Result Value Ref Range   Influenza A, POC Negative Negative   Influenza B, POC Negative Negative     Psychiatric Specialty Exam: Physical Exam  Review of Systems  Respiratory:         Congestion     Weight 228 lb (103.4 kg).There is no height or weight on file to calculate BMI.  General Appearance: Casual  Eye Contact:  Fair  Speech:  Slow  Volume:  Decreased  Mood:   tired  Affect:  Congruent  Thought Process:  Goal Directed  Orientation:  Full (Time, Place, and Person)  Thought Content:  Logical  Suicidal Thoughts:  No  Homicidal Thoughts:  No  Memory:  Immediate;   Good Recent;   Good Remote;   Good  Judgement:  Good  Insight:  Present  Psychomotor Activity:  Decreased  Concentration:  Concentration: Good and Attention Span: Fair  Recall:  Good  Fund of Knowledge:  Good  Language:  Good  Akathisia:  No  Handed:  Right  AIMS (if indicated):     Assets:  Communication Skills Desire for Improvement Housing Resilience Social Support Talents/Skills Transportation  ADL's:  Intact  Cognition:  WNL  Sleep:  fair     Assessment/Plan: Bipolar I disorder (HCC) - Plan: lamoTRIgine (LAMICTAL) 25 MG tablet  Attention deficit hyperactivity disorder (ADHD), combined type - Plan: lamoTRIgine (LAMICTAL) 25 MG tablet  I reviewed blood work results.  She had negative for strep and COVID.  She is taking steroids.  She admitted feeling tired but also reported Lamictal helping her mood.  She did not do IOP because of the conflict with work and classes.  She is open to try higher dose of Lamictal.  We will do 75 mg daily.  Patient is not seeing any therapist but like to get appointment with a therapist.  She feels the current therapist Erdine is too far book that she cannot see her frequently.  Recommended to call us back if he has any question or any concern.  Follow-up in 2 months.  Patient is coming to the office soon to drop the FMLA.   Follow Up Instructions:      I discussed the assessment and treatment plan with the patient. The patient was provided an opportunity to ask questions and all were answered. The patient agreed with the plan and demonstrated an understanding of the instructions.   The patient was advised to call back or seek an in-person evaluation if the symptoms worsen or if the condition fails to improve as anticipated.    Collaboration of Care: Other provider involved in patient's care AEB notes are available in epic to review.  Patient/Guardian was advised Release of Information must be obtained prior to any record release in order to collaborate their care with an outside provider. Patient/Guardian was advised if they have not already done so to contact the registration department to sign all necessary forms in order for [place] to release information regarding their care.   Consent: Patient/Guardian gives verbal consent for treatment and assignment of benefits for services provided during this visit. Patient/Guardian expressed understanding and agreed to proceed.     I provided 28 minutes of non face to face time during this encounter.  Kathlee Nations, MD 03/22/2022

## 2022-04-04 ENCOUNTER — Other Ambulatory Visit (HOSPITAL_COMMUNITY): Payer: Self-pay | Admitting: Psychiatry

## 2022-04-04 DIAGNOSIS — F319 Bipolar disorder, unspecified: Secondary | ICD-10-CM

## 2022-04-04 DIAGNOSIS — F902 Attention-deficit hyperactivity disorder, combined type: Secondary | ICD-10-CM

## 2022-04-07 ENCOUNTER — Other Ambulatory Visit (HOSPITAL_COMMUNITY): Payer: Self-pay | Admitting: Psychiatry

## 2022-04-07 DIAGNOSIS — F902 Attention-deficit hyperactivity disorder, combined type: Secondary | ICD-10-CM

## 2022-04-07 DIAGNOSIS — F319 Bipolar disorder, unspecified: Secondary | ICD-10-CM

## 2022-04-12 ENCOUNTER — Encounter: Payer: Federal, State, Local not specified - PPO | Admitting: Nurse Practitioner

## 2022-05-23 ENCOUNTER — Encounter (HOSPITAL_COMMUNITY): Payer: Self-pay | Admitting: Psychiatry

## 2022-05-23 ENCOUNTER — Telehealth (HOSPITAL_BASED_OUTPATIENT_CLINIC_OR_DEPARTMENT_OTHER): Payer: Federal, State, Local not specified - PPO | Admitting: Psychiatry

## 2022-05-23 VITALS — Wt 229.0 lb

## 2022-05-23 DIAGNOSIS — F902 Attention-deficit hyperactivity disorder, combined type: Secondary | ICD-10-CM

## 2022-05-23 DIAGNOSIS — F5102 Adjustment insomnia: Secondary | ICD-10-CM

## 2022-05-23 DIAGNOSIS — F319 Bipolar disorder, unspecified: Secondary | ICD-10-CM | POA: Diagnosis not present

## 2022-05-23 MED ORDER — TRAZODONE HCL 100 MG PO TABS: 100.0000 mg | ORAL_TABLET | Freq: Every evening | ORAL | 0 refills | Status: DC | PRN

## 2022-05-23 MED ORDER — LAMOTRIGINE 100 MG PO TABS: 100.0000 mg | ORAL_TABLET | Freq: Every day | ORAL | 1 refills | Status: DC
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
## 2022-05-24 IMAGING — CT CT HEAD W/O CM
4 series · 17 of 47 positions shown, 19 images · non-contrast
Comparison: None.

CLINICAL DATA: Headache, classic migraine.  Persistent vomiting.

EXAM:
CT HEAD WITHOUT CONTRAST
TECHNIQUE: Contiguous axial images were obtained from the base of the skull
through the vertex without intravenous contrast.

[Series 3: head without · axial · non-contrast · 0.39mm/px · z∈[-160,-40]mm · 7 of 33 slices shown, 9 images]
[im 5/33  brain]
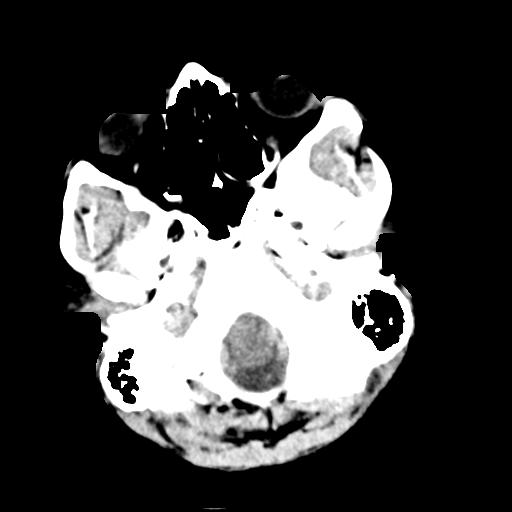
[im 5/33  bone]
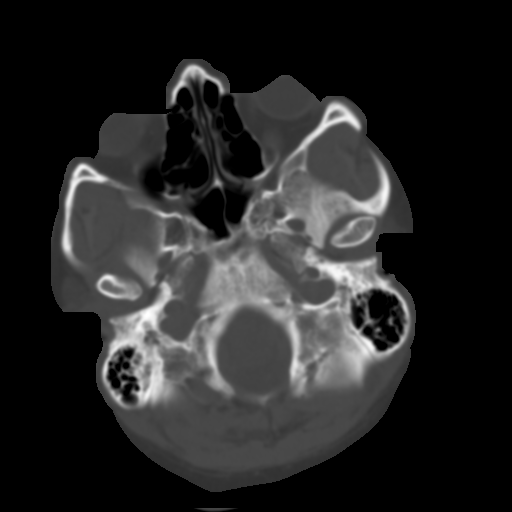
[im 9/33  brain]
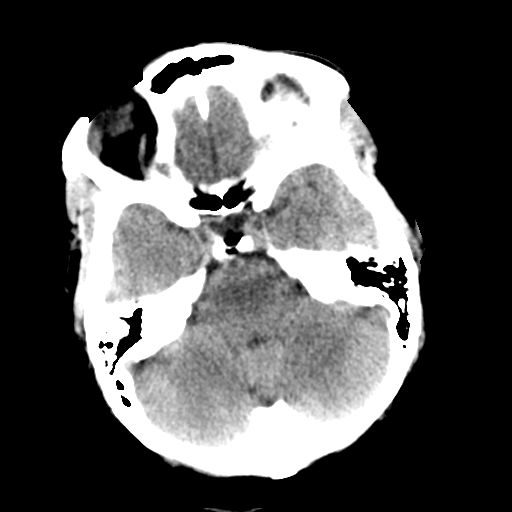
[im 13/33  brain]
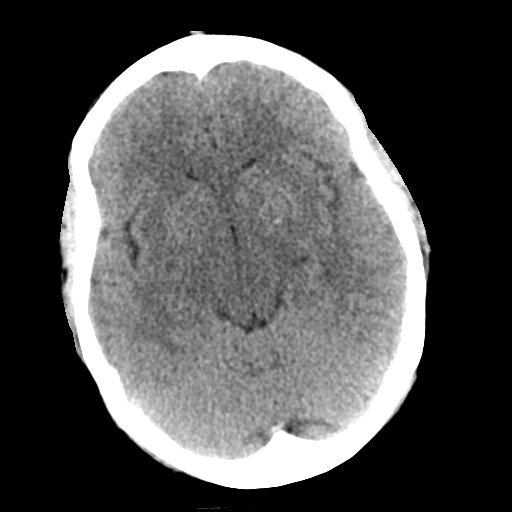
[im 17/33  brain]
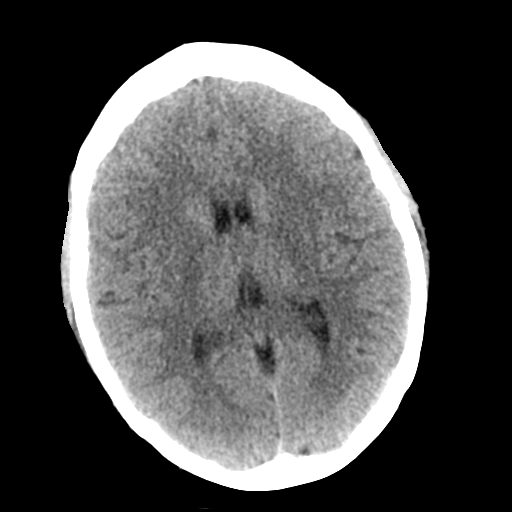
[im 21/33  brain]
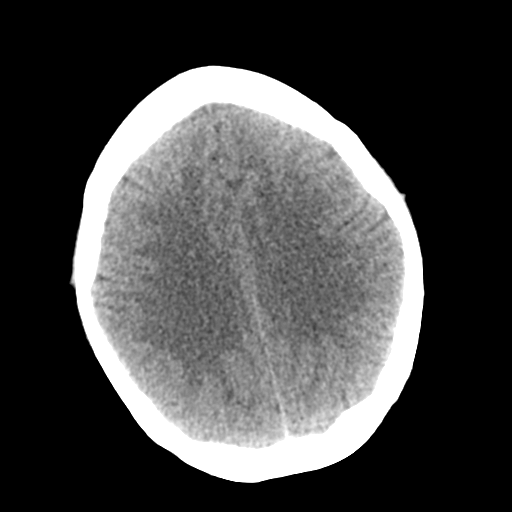
[im 21/33  bone]
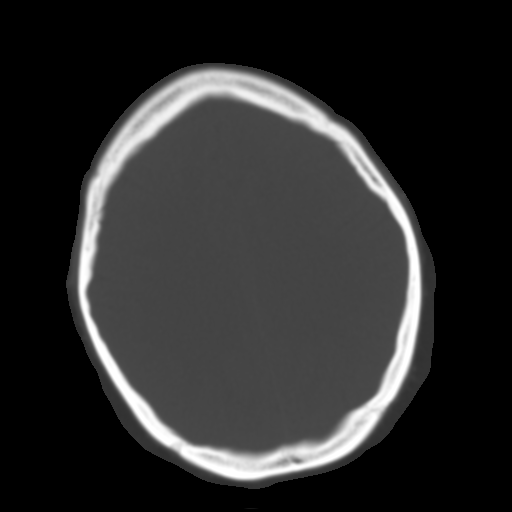
[im 25/33  brain]
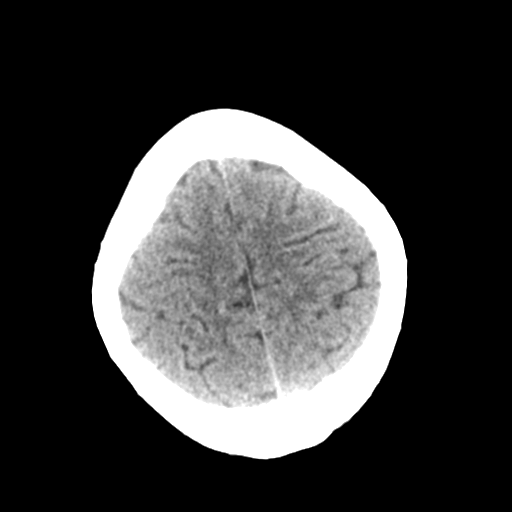
[im 29/33  brain]
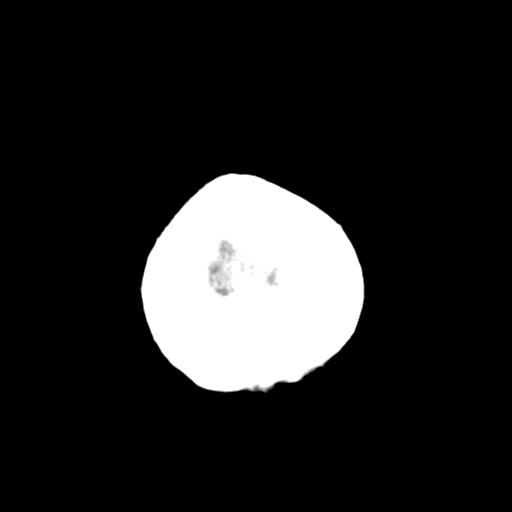

[Series 4: head bone · axial · 0.39mm/px · z∈[-164,-108]mm · 4 of 83 slices shown]
[im 9/83  bone]
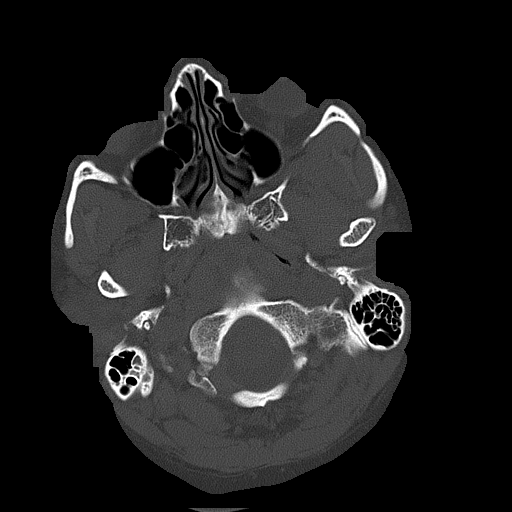
[im 17/83  bone]
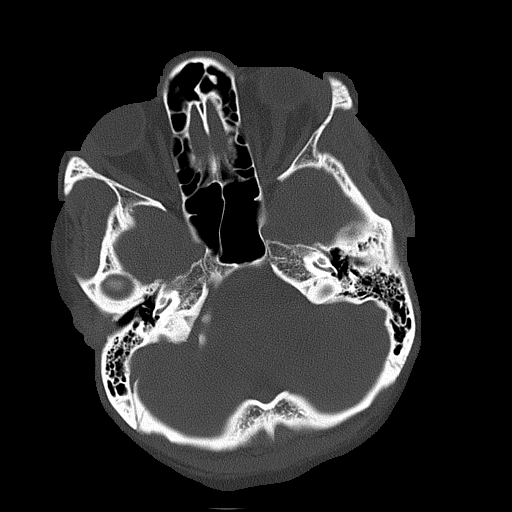
[im 25/83  bone]
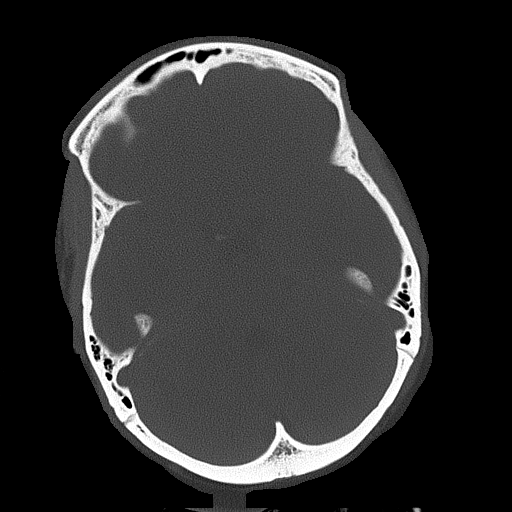
[im 37/83  bone]
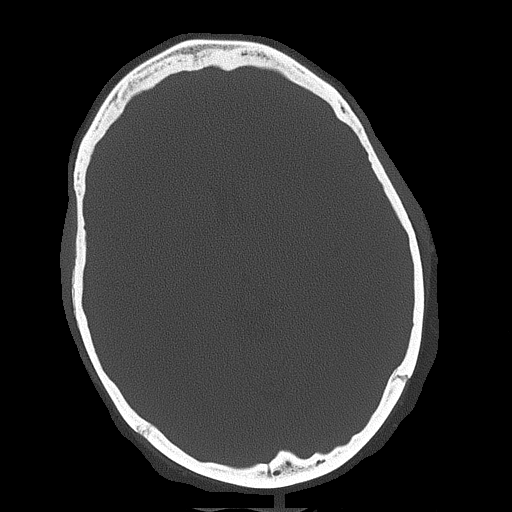

[Series 5: head without cor · coronal · non-contrast · 0.32mm/px · 3 of 67 slices shown]
[im 23/67  brain]
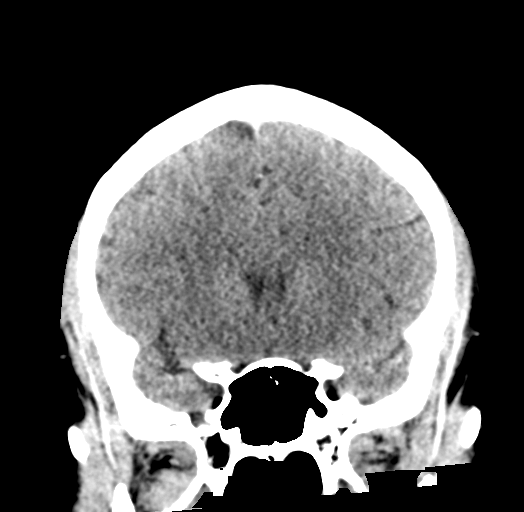
[im 30/67  brain]
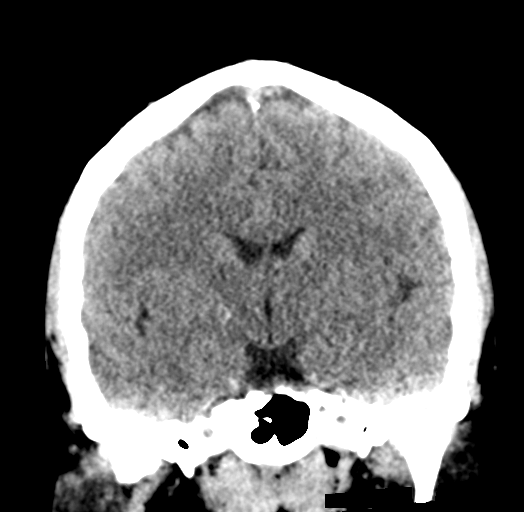
[im 37/67  brain]
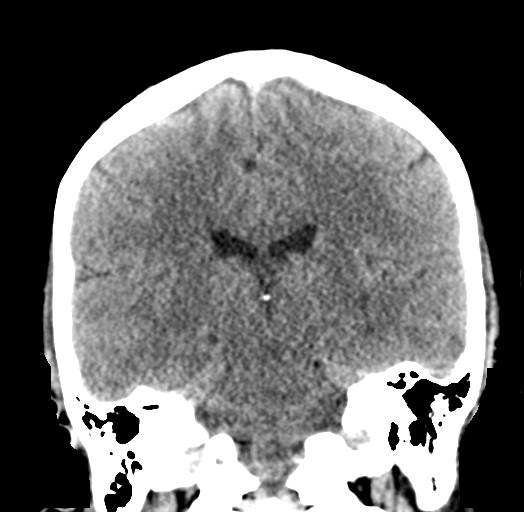

[Series 6: head without sag · sagittal · non-contrast · 0.32mm/px · 3 of 66 slices shown]
[im 23/66  brain]
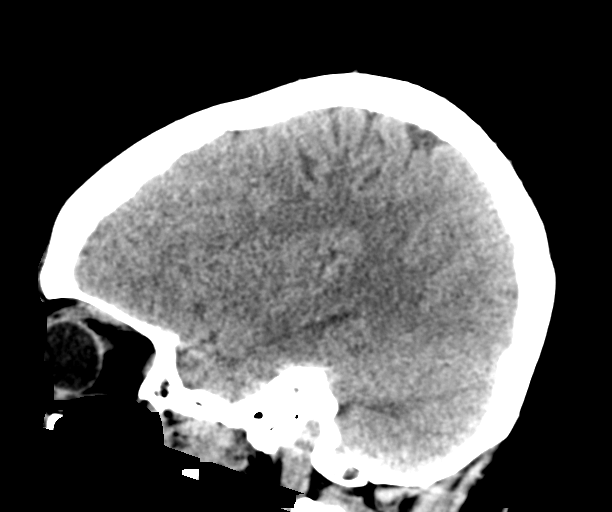
[im 33/66  brain]
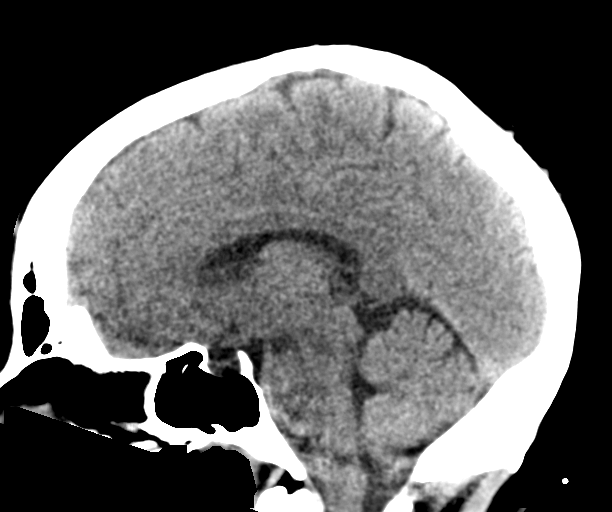
[im 43/66  brain]
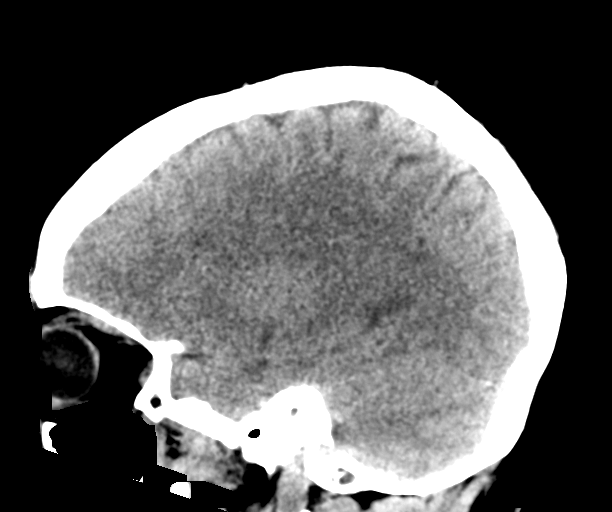

[17 of 47 positions shown; findings below may reference images not displayed]

FINDINGS: Brain: No evidence of acute infarction, hemorrhage, hydrocephalus,
extra-axial collection or mass lesion/mass effect. Incidental
mineralization symmetrically at the globus pallidus.

Vascular: No hyperdense vessel or unexpected calcification.

Skull: Normal. Negative for fracture or focal lesion.

Sinuses/Orbits: No acute finding.
IMPRESSION: Negative head CT.

## 2022-06-05 ENCOUNTER — Other Ambulatory Visit (HOSPITAL_COMMUNITY): Payer: Self-pay | Admitting: Psychiatry

## 2022-06-05 DIAGNOSIS — F902 Attention-deficit hyperactivity disorder, combined type: Secondary | ICD-10-CM

## 2022-06-05 DIAGNOSIS — F5102 Adjustment insomnia: Secondary | ICD-10-CM

## 2022-06-05 DIAGNOSIS — F319 Bipolar disorder, unspecified: Secondary | ICD-10-CM

## 2022-07-26 ENCOUNTER — Encounter (HOSPITAL_COMMUNITY): Payer: Self-pay | Admitting: Psychiatry

## 2022-07-26 ENCOUNTER — Encounter (HOSPITAL_COMMUNITY): Payer: Self-pay

## 2022-07-26 ENCOUNTER — Telehealth (HOSPITAL_BASED_OUTPATIENT_CLINIC_OR_DEPARTMENT_OTHER): Payer: Federal, State, Local not specified - PPO | Admitting: Psychiatry

## 2022-07-26 VITALS — Wt 232.0 lb

## 2022-07-26 DIAGNOSIS — F902 Attention-deficit hyperactivity disorder, combined type: Secondary | ICD-10-CM

## 2022-07-26 DIAGNOSIS — F5102 Adjustment insomnia: Secondary | ICD-10-CM

## 2022-07-26 DIAGNOSIS — F319 Bipolar disorder, unspecified: Secondary | ICD-10-CM

## 2022-07-26 MED ORDER — LAMOTRIGINE 100 MG PO TABS: 100.0000 mg | ORAL_TABLET | Freq: Every day | ORAL | 2 refills | Status: DC

## 2022-07-26 MED ORDER — TRAZODONE HCL 100 MG PO TABS: 100.0000 mg | ORAL_TABLET | Freq: Every evening | ORAL | 2 refills | Status: DC | PRN

## 2022-07-26 NOTE — Progress Notes (Signed)
[facility] MD Virtual Progress Note   Patient Location: Home Provider Location: Office  I connect with patient by video and verified that I am speaking with correct person by using two identifiers. I discussed the limitations of evaluation and management by telemedicine and the availability of in person appointments. I also discussed with the patient that there may be a patient responsible charge related to this service. The patient expressed understanding and agreed to proceed.  Morgan Oneal [id number] 37 y.o.  07/26/2022 8:26 AM  History of Present Illness:  Patient is evaluated by video session.  She is doing better in the past few weeks.  She moved to [facility] and reported it is less stressful.  Her oldest daughter moved back to her father's place.  She is sleeping better since trazodone increased to 100 mg.  Last week she did go to beach and had a good time.  She is making better grades in school and now looking forward for new semester.  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  She forgot to drop the FMLA but like to mail the papers to our office.  She reported her mood swing, irritability, anger is less intense and less frequent.  Attention and focus is better and she is able to do better at work.  She admitted not able to schedule appointment with therapist but like to get new referral from our office.  She has no rash, itching, tremors or shakes.  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  Her appetite is okay.  She started talking to someone who lives in [place] and so far she feels things are going okay.  She denies any suicidal thoughts or homicidal thoughts.  Patient lives with her kids.  Past Psychiatric History: H/O ADHD and given Adderall but no details. H/O cutting herself when parents going through divorce.  No h/o inpatient, suicidal attempt,  psychosis, mood swings, highs and lows, anger, irritability.  H/O hevay ETOH.  We tried Abilify and Strattera that did not work.    Outpatient Encounter Medications as of 07/26/2022  Medication Sig   albuterol (VENTOLIN HFA) 108 (90 Base) MCG/ACT inhaler Inhale 2 puffs into the lungs every 6 (six) hours as needed for wheezing or shortness of breath.   ARIPiprazole (ABILIFY) 2 MG tablet Take one tab daily (Patient not taking: Reported on 01/31/2022)   aspirin-acetaminophen-caffeine (EXCEDRIN MIGRAINE) 250-250-65 MG tablet Take 1 tablet by mouth every 6 (six) hours as needed for headache.   atomoxetine (STRATTERA) 60 MG capsule Take 1 capsule (60 mg total) by mouth daily. (Patient not taking: Reported on 01/31/2022)   benzonatate (TESSALON) 100 MG capsule Take 1 capsule (100 mg total) by mouth 2 (two) times daily as needed for cough.   ipratropium (ATROVENT) 0.06 % nasal spray Place 2 sprays into both nostrils 4 (four) times daily.   lamoTRIgine (LAMICTAL) 100 MG tablet Take 1 tablet (100 mg total) by mouth daily.   omeprazole (PRILOSEC) 20 MG capsule Take 1 capsule (20 mg total) by mouth daily.   ondansetron (ZOFRAN-ODT) 4 MG disintegrating tablet Take 1 tablet (4 mg total) by mouth every 8 (eight) hours as needed for nausea or vomiting.   predniSONE (DELTASONE) 20 MG tablet Take 60mg  PO daily x 2 days, then40mg  PO daily x 2 days, then 20mg  PO daily x 3 days   promethazine-dextromethorphan (PROMETHAZINE-DM) 6.25-15 MG/5ML syrup Take  5 mLs by mouth 4 (four) times daily as needed for cough.   traZODone (DESYREL) 100 MG tablet Take 1 tablet (100 mg total) by mouth at bedtime as needed for sleep. Rarely needed   No facility-administered encounter medications on file as of 07/26/2022.    No results found for this or any previous visit (from the past 2160 hour(s)).   Psychiatric Specialty Exam: Physical Exam  Review of Systems  Weight 232 lb (105.2 kg).There is no height or weight on file to  calculate BMI.  General Appearance: Casual  Eye Contact:  Good  Speech:  Clear and Coherent  Volume:  Normal  Mood:  Euthymic  Affect:  Appropriate  Thought Process:  Goal Directed  Orientation:  Full (Time, Place, and Person)  Thought Content:  WDL  Suicidal Thoughts:  No  Homicidal Thoughts:  No  Memory:  Immediate;   Good Recent;   Good Remote;   Good  Judgement:  Good  Insight:  Good  Psychomotor Activity:  Normal  Concentration:  Concentration: Good and Attention Span: Good  Recall:  Good  Fund of Knowledge:  Good  Language:  Good  Akathisia:  No  Handed:  Right  AIMS (if indicated):     Assets:  Communication Skills Desire for Improvement Financial Resources/Insurance Housing Resilience Social Support Transportation  ADL's:  Intact  Cognition:  WNL  Sleep:  better     Assessment/Plan: Bipolar I disorder (HCC) - Plan: lamoTRIgine (LAMICTAL) 100 MG tablet, traZODone (DESYREL) 100 MG tablet  Attention deficit hyperactivity disorder (ADHD), combined type - Plan: lamoTRIgine (LAMICTAL) 100 MG tablet  Adjustment insomnia - Plan: traZODone (DESYREL) 100 MG tablet  Patient doing better since family stresses less intense and she moved to new place in [place].  Her oldest daughter moved back to her father's place.  Her grades are improved from the past.  She is looking for work for a new semester.  Overall she feels things are going very well however she would like to get a referral for therapy and also like to mail the FMLA papers because it is expired and she still like to have it in case she has a flareup when she can take the day off.  She feels the current medicine is working.  Continue trazodone 100 mg at bedtime and Lamictal 100 mg daily.  Recommended to call us back if there is any question or any concern.  She has no rash, itching tremors or shakes.  Follow-up in 3 months.   Follow Up Instructions:     I discussed the assessment and treatment plan with the  patient. The patient was provided an opportunity to ask questions and all were answered. The patient agreed with the plan and demonstrated an understanding of the instructions.   The patient was advised to call back or seek an in-person evaluation if the symptoms worsen or if the condition fails to improve as anticipated.    Collaboration of Care: Other provider involved in patient's care AEB notes are available in epic to review.  Her primary care is in [place].  Patient/Guardian was advised Release of Information must be obtained prior to any record release in order to collaborate their care with an outside provider. Patient/Guardian was advised if they have not already done so to contact the registration department to sign all necessary forms in order for [place] to release information regarding their care.   Consent: Patient/Guardian gives verbal consent for treatment and assignment of benefits for  services provided during this visit. Patient/Guardian expressed understanding and agreed to proceed.     I provided 16 minutes of non face to face time during this encounter.  Note: This document was prepared by [employer] voice dictation technology and any errors that results from this process are unintentional.    Cleotis Nipper, MD 07/26/2022

## 2022-08-09 ENCOUNTER — Ambulatory Visit: Payer: Federal, State, Local not specified - PPO | Admitting: Nurse Practitioner

## 2022-08-09 NOTE — Progress Notes (Deleted)
   LMP  (LMP Unknown)    Subjective:    Patient ID: Morgan Oneal, female    DOB: 02/27/1985, 37 y.o.   MRN: [id number]  HPI: Morgan Oneal is a 37 y.o. female  No chief complaint on file.   Relevant past medical, surgical, family and social history reviewed and updated as indicated. Interim medical history since our last visit reviewed. Allergies and medications reviewed and updated.  Review of Systems  Constitutional: Negative for fever or weight change.  Respiratory: Negative for cough and shortness of breath.   Cardiovascular: Negative for chest pain or palpitations.  Gastrointestinal: Negative for abdominal pain, no bowel changes.  Musculoskeletal: Negative for gait problem or joint swelling.  Skin: Negative for rash.  Neurological: Negative for dizziness or headache.  No other specific complaints in a complete review of systems (except as listed in HPI above).      Objective:    LMP  (LMP Unknown)   Wt Readings from Last 3 Encounters:  03/20/22 229 lb 4.8 oz (104 kg)  01/24/22 228 lb 12.8 oz (103.8 kg)  12/31/21 231 lb 14.8 oz (105.2 kg)    Physical Exam  Constitutional: Patient appears well-developed and well-nourished. Obese *** No distress.  HEENT: head atraumatic, normocephalic, pupils equal and reactive to light, ears ***, neck supple, throat within normal limits Cardiovascular: Normal rate, regular rhythm and normal heart sounds.  No murmur heard. No BLE edema. Pulmonary/Chest: Effort normal and breath sounds normal. No respiratory distress. Abdominal: Soft.  There is no tenderness. Psychiatric: Patient has a normal mood and affect. behavior is normal. Judgment and thought content normal.     Assessment & Plan:   Problem List Items Addressed This Visit   None    Follow up plan: No follow-ups on file.

## 2022-08-25 DIAGNOSIS — J029 Acute pharyngitis, unspecified: Secondary | ICD-10-CM | POA: Diagnosis not present

## 2022-08-25 DIAGNOSIS — U071 COVID-19: Secondary | ICD-10-CM | POA: Diagnosis not present

## 2022-09-11 DIAGNOSIS — Z7182 Exercise counseling: Secondary | ICD-10-CM | POA: Diagnosis not present

## 2022-09-11 DIAGNOSIS — Z13220 Encounter for screening for lipoid disorders: Secondary | ICD-10-CM | POA: Diagnosis not present

## 2022-09-11 DIAGNOSIS — R0789 Other chest pain: Secondary | ICD-10-CM | POA: Diagnosis not present

## 2022-09-11 DIAGNOSIS — E669 Obesity, unspecified: Secondary | ICD-10-CM | POA: Diagnosis not present

## 2022-09-11 DIAGNOSIS — G44209 Tension-type headache, unspecified, not intractable: Secondary | ICD-10-CM | POA: Diagnosis not present

## 2022-09-11 DIAGNOSIS — Z131 Encounter for screening for diabetes mellitus: Secondary | ICD-10-CM | POA: Diagnosis not present

## 2022-09-11 DIAGNOSIS — Z1159 Encounter for screening for other viral diseases: Secondary | ICD-10-CM | POA: Diagnosis not present

## 2022-09-29 DIAGNOSIS — S83242A Other tear of medial meniscus, current injury, left knee, initial encounter: Secondary | ICD-10-CM | POA: Diagnosis not present

## 2022-10-04 DIAGNOSIS — S83242A Other tear of medial meniscus, current injury, left knee, initial encounter: Secondary | ICD-10-CM | POA: Diagnosis not present

## 2022-10-18 DIAGNOSIS — M2242 Chondromalacia patellae, left knee: Secondary | ICD-10-CM | POA: Diagnosis not present

## 2022-10-25 ENCOUNTER — Encounter (HOSPITAL_COMMUNITY): Payer: Self-pay | Admitting: Psychiatry

## 2022-10-25 ENCOUNTER — Telehealth (HOSPITAL_COMMUNITY): Payer: Federal, State, Local not specified - PPO | Admitting: Psychiatry

## 2022-10-25 VITALS — Wt 235.0 lb

## 2022-10-25 DIAGNOSIS — F902 Attention-deficit hyperactivity disorder, combined type: Secondary | ICD-10-CM

## 2022-10-25 DIAGNOSIS — F5102 Adjustment insomnia: Secondary | ICD-10-CM

## 2022-10-25 DIAGNOSIS — F319 Bipolar disorder, unspecified: Secondary | ICD-10-CM

## 2022-10-25 MED ORDER — LAMOTRIGINE 100 MG PO TABS: 100.0000 mg | ORAL_TABLET | Freq: Every day | ORAL | 2 refills | Status: DC

## 2022-10-25 MED ORDER — TRAZODONE HCL 50 MG PO TABS: 50.0000 mg | ORAL_TABLET | Freq: Every evening | ORAL | 1 refills | Status: DC | PRN
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
## 2022-11-07 DIAGNOSIS — E66812 Obesity, class 2: Secondary | ICD-10-CM | POA: Diagnosis not present

## 2022-11-07 DIAGNOSIS — F319 Bipolar disorder, unspecified: Secondary | ICD-10-CM | POA: Diagnosis not present

## 2022-11-07 DIAGNOSIS — Z113 Encounter for screening for infections with a predominantly sexual mode of transmission: Secondary | ICD-10-CM | POA: Diagnosis not present

## 2022-11-07 DIAGNOSIS — F411 Generalized anxiety disorder: Secondary | ICD-10-CM | POA: Diagnosis not present

## 2022-11-07 DIAGNOSIS — R7303 Prediabetes: Secondary | ICD-10-CM | POA: Diagnosis not present

## 2022-11-20 ENCOUNTER — Other Ambulatory Visit (HOSPITAL_COMMUNITY): Payer: Self-pay | Admitting: Psychiatry

## 2022-11-20 DIAGNOSIS — R7303 Prediabetes: Secondary | ICD-10-CM | POA: Diagnosis not present

## 2022-11-20 DIAGNOSIS — F319 Bipolar disorder, unspecified: Secondary | ICD-10-CM

## 2022-11-20 DIAGNOSIS — F5102 Adjustment insomnia: Secondary | ICD-10-CM

## 2022-12-01 DIAGNOSIS — K529 Noninfective gastroenteritis and colitis, unspecified: Secondary | ICD-10-CM | POA: Diagnosis not present

## 2022-12-12 DIAGNOSIS — F3181 Bipolar II disorder: Secondary | ICD-10-CM | POA: Diagnosis not present

## 2022-12-19 DIAGNOSIS — F3181 Bipolar II disorder: Secondary | ICD-10-CM | POA: Diagnosis not present

## 2022-12-20 DIAGNOSIS — M25561 Pain in right knee: Secondary | ICD-10-CM | POA: Diagnosis not present

## 2022-12-20 DIAGNOSIS — M25562 Pain in left knee: Secondary | ICD-10-CM | POA: Diagnosis not present

## 2022-12-20 DIAGNOSIS — R29898 Other symptoms and signs involving the musculoskeletal system: Secondary | ICD-10-CM | POA: Diagnosis not present

## 2022-12-20 DIAGNOSIS — G8929 Other chronic pain: Secondary | ICD-10-CM | POA: Diagnosis not present

## 2022-12-24 DIAGNOSIS — F3181 Bipolar II disorder: Secondary | ICD-10-CM | POA: Diagnosis not present

## 2023-01-04 ENCOUNTER — Other Ambulatory Visit (HOSPITAL_COMMUNITY): Payer: Self-pay | Admitting: Psychiatry

## 2023-01-04 DIAGNOSIS — F319 Bipolar disorder, unspecified: Secondary | ICD-10-CM

## 2023-01-04 DIAGNOSIS — F5102 Adjustment insomnia: Secondary | ICD-10-CM

## 2023-01-23 ENCOUNTER — Other Ambulatory Visit (HOSPITAL_COMMUNITY): Payer: Self-pay | Admitting: Psychiatry

## 2023-01-23 DIAGNOSIS — F319 Bipolar disorder, unspecified: Secondary | ICD-10-CM

## 2023-01-23 DIAGNOSIS — F902 Attention-deficit hyperactivity disorder, combined type: Secondary | ICD-10-CM

## 2023-03-22 ENCOUNTER — Encounter (HOSPITAL_COMMUNITY): Payer: Self-pay | Admitting: Psychiatry

## 2023-03-22 ENCOUNTER — Telehealth (HOSPITAL_BASED_OUTPATIENT_CLINIC_OR_DEPARTMENT_OTHER): Admitting: Psychiatry

## 2023-03-22 VITALS — Wt 220.0 lb

## 2023-03-22 DIAGNOSIS — F319 Bipolar disorder, unspecified: Secondary | ICD-10-CM

## 2023-03-22 DIAGNOSIS — F902 Attention-deficit hyperactivity disorder, combined type: Secondary | ICD-10-CM | POA: Diagnosis not present

## 2023-03-22 MED ORDER — LAMOTRIGINE 100 MG PO TABS: 100.0000 mg | ORAL_TABLET | Freq: Every day | ORAL | 2 refills | Status: DC

## 2023-03-22 NOTE — Progress Notes (Signed)
 [facility] MD Virtual Progress Note   Patient Location: Home Provider Location: Home Office  I connect with patient by video and verified that I am speaking with correct person by using two identifiers. I discussed the limitations of evaluation and management by telemedicine and the availability of in person appointments. I also discussed with the patient that there may be a patient responsible charge related to this service. The patient expressed understanding and agreed to proceed.  Morgan Oneal [id number] 38 y.o.  03/22/2023 11:09 AM  History of Present Illness:  Patient is evaluated by video session.  She was last seen in October and she apologized missing appointment.  Patient told a lot of things change since the last visit.  She moved to new place with her 2 children.  She told mother that she cannot move then with her because it was too much stress for her.  She also changed her job schedule and working every day 8 hours which has been very helpful rather than 12 hours a day.  She is taking more time to for herself and children.  Since job schedule change her sleep pattern much improved when she is not taking trazodone.  She has no rash, itching, tremors or shakes.  She is compliant with Lamictal 100 mg daily.  She reported less irritability, mania, anger, mood swings.  She denies any hallucination, paranoia or any suicidal thoughts.  She is not involved in any self abusive behavior.  Her attention concentration is good.  She is able to do multitasking and able to finish her task on time.  She is also watching her calorie intake and she had lost more than 10 pounds since choosing diet option healthier.  She has not reach out for therapy but okay to consider counseling.  Her sleep is good.  She wants to continue Lamictal.  She is not taking Abilify, trazodone and Strattera.  She want to continue Lamictal.  She denies drinking or using any illegal substances.  Past  Psychiatric History: H/O ADHD and given Adderall but no details. H/O cutting herself when parents going through divorce.  No h/o inpatient, suicidal attempt, psychosis, mood swings, highs and lows, anger, irritability.  H/O hevay ETOH.  We tried Abilify and Strattera that did not work.    Outpatient Encounter Medications as of 03/22/2023  Medication Sig   albuterol (VENTOLIN HFA) 108 (90 Base) MCG/ACT inhaler Inhale 2 puffs into the lungs every 6 (six) hours as needed for wheezing or shortness of breath.   ARIPiprazole (ABILIFY) 2 MG tablet Take one tab daily (Patient not taking: Reported on 01/31/2022)   aspirin-acetaminophen-caffeine (EXCEDRIN MIGRAINE) 250-250-65 MG tablet Take 1 tablet by mouth every 6 (six) hours as needed for headache.   atomoxetine (STRATTERA) 60 MG capsule Take 1 capsule (60 mg total) by mouth daily. (Patient not taking: Reported on 01/31/2022)   benzonatate (TESSALON) 100 MG capsule Take 1 capsule (100 mg total) by mouth 2 (two) times daily as needed for cough.   ipratropium (ATROVENT) 0.06 % nasal spray Place 2 sprays into both nostrils 4 (four) times daily.   lamoTRIgine (LAMICTAL) 100 MG tablet Take 1 tablet (100 mg total) by mouth daily.   omeprazole (PRILOSEC) 20 MG capsule Take 1 capsule (20 mg total) by mouth daily.   ondansetron (ZOFRAN-ODT) 4 MG disintegrating tablet Take 1 tablet (4 mg total) by mouth every 8 (eight) hours as needed for nausea or vomiting.   predniSONE (DELTASONE) 20 MG tablet Take 60mg   PO daily x 2 days, then40mg  PO daily x 2 days, then 20mg  PO daily x 3 days   promethazine-dextromethorphan (PROMETHAZINE-DM) 6.25-15 MG/5ML syrup Take 5 mLs by mouth 4 (four) times daily as needed for cough.   traZODone (DESYREL) 50 MG tablet Take 1 tablet (50 mg total) by mouth at bedtime as needed for sleep. Rarely needed   No facility-administered encounter medications on file as of 03/22/2023.    No results found for this or any previous visit (from the past  2160 hours).   Psychiatric Specialty Exam: Physical Exam  Review of Systems  Weight 220 lb (99.8 kg).There is no height or weight on file to calculate BMI.  General Appearance: Casual  Eye Contact:  Good  Speech:  Normal Rate  Volume:  Normal  Mood:  Euthymic  Affect:  Appropriate  Thought Process:  Goal Directed  Orientation:  Full (Time, Place, and Person)  Thought Content:  Logical  Suicidal Thoughts:  No  Homicidal Thoughts:  No  Memory:  Immediate;   Good Recent;   Good Remote;   Good  Judgement:  Fair  Insight:  Fair  Psychomotor Activity:  Normal  Concentration:  Concentration: Good and Attention Span: Good  Recall:  Good  Fund of Knowledge:  Good  Language:  Good  Akathisia:  No  Handed:  Right  AIMS (if indicated):     Assets:  Communication Skills Desire for Improvement Housing Talents/Skills Transportation  ADL's:  Intact  Cognition:  WNL  Sleep:  better     Assessment/Plan: Bipolar I disorder (HCC) - Plan: lamoTRIgine (LAMICTAL) 100 MG tablet  Attention deficit hyperactivity disorder (ADHD), combined type - Plan: lamoTRIgine (LAMICTAL) 100 MG tablet  Patient is a stable on Lamictal 100 mg daily.  Since the dose increase and change her lifestyle and moving to a better place she reported improvement in her mood, sleep and her physical health.  She lost weight.  She admitted there are days when she does not take the medication as she tends to forget.  She also like to have a referral for therapy.  Continue Lamictal 1 mg daily.  She has no rash, itching, tremors or shakes.  She is no longer taking trazodone, Abilify and Strattera.  Recommend to call us back if she has any question or any concern.  Follow-up in 3 months.  Encouraged to keep appointment in the future for noncompliance with medication.   Follow Up Instructions:     I discussed the assessment and treatment plan with the patient. The patient was provided an opportunity to ask questions and all  were answered. The patient agreed with the plan and demonstrated an understanding of the instructions.   The patient was advised to call back or seek an in-person evaluation if the symptoms worsen or if the condition fails to improve as anticipated.    Collaboration of Care: Other provider involved in patient's care AEB notes are available in epic to review  Patient/Guardian was advised Release of Information must be obtained prior to any record release in order to collaborate their care with an outside provider. Patient/Guardian was advised if they have not already done so to contact the registration department to sign all necessary forms in order for [place] to release information regarding their care.   Consent: Patient/Guardian gives verbal consent for treatment and assignment of benefits for services provided during this visit. Patient/Guardian expressed understanding and agreed to proceed.     I provided 28 minutes of non  face to face time during this encounter.  Note: This document was prepared by [employer] voice dictation technology and any errors that results from this process are unintentional.    Cleotis Nipper, MD 03/22/2023

## 2023-06-21 ENCOUNTER — Telehealth (HOSPITAL_COMMUNITY): Admitting: Psychiatry

## 2023-06-21 ENCOUNTER — Encounter (HOSPITAL_COMMUNITY): Payer: Self-pay | Admitting: Psychiatry

## 2023-06-21 VITALS — Wt 212.0 lb

## 2023-06-21 DIAGNOSIS — F319 Bipolar disorder, unspecified: Secondary | ICD-10-CM

## 2023-06-21 DIAGNOSIS — F5102 Adjustment insomnia: Secondary | ICD-10-CM | POA: Diagnosis not present

## 2023-06-21 DIAGNOSIS — F902 Attention-deficit hyperactivity disorder, combined type: Secondary | ICD-10-CM | POA: Diagnosis not present

## 2023-06-21 MED ORDER — LAMOTRIGINE 100 MG PO TABS: 100.0000 mg | ORAL_TABLET | Freq: Every day | ORAL | 2 refills | Status: DC

## 2023-06-21 MED ORDER — TRAZODONE HCL 50 MG PO TABS
50.0000 mg | ORAL_TABLET | Freq: Every evening | ORAL | 1 refills | Status: DC | PRN
Start: 2023-06-21 — End: 2023-09-20
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
## 2023-07-28 ENCOUNTER — Encounter (HOSPITAL_COMMUNITY): Payer: Self-pay

## 2023-08-20 NOTE — Progress Notes (Signed)
 [facility]drologist Encounter This medical encounter was conducted virtually using [email or web address]  [email or web address] protocols.  Patient ID: Morgan Oneal is a 38 y.o. female who presents by video interaction for evaluation.  I have identified myself to the patient and conveyed my credentials to [employer].  Patient has signed informed consent on file in medical record.  Present on Video Call: Is there someone else in the room? No..  Assessment/Plan:   Morgan Oneal was seen today for uri.  Diagnoses and all orders for this visit:  Bacterial URI -     doxycycline  (VIBRAMYCIN ) 100 MG capsule; Take 1 capsule (100 mg total) by mouth two (2) times a day for 5 days. -     predniSONE  (DELTASONE ) 20 MG tablet; Take 2 tablets (40 mg total) by mouth in the morning for 5 days.   Counseled patient on OTC & prescription medications for symptom management.  -- Patient verbalized an understanding of today's assessment and recommendations, as well as the purpose of ongoing medications.  Follow-up as Needed  and Follow-up with PCP  Medication adherence and barriers to the treatment plan have been addressed. Opportunities to optimize healthy behaviors have been discussed. Patient / caregiver voiced understanding.     Subjective:   HPI:  Morgan Oneal is 38 y.o. and presents today in the [facility] to be evaluated for ENT issues . The PCP for this patient is Komorian, Hospital doctor, MD. Congestion, runny nose, discolored mucus, and fever/chills at night. Friday headache. Saturday pressure, runny nose and green mucus then later that night fever/chills. Sudafed, Emergen-c, Claritin , Benadryl .   URI      ROS  ROS negative unless notated in HPI above.  I have reviewed the problem list, past medical history, past family history, medications, and allergies and have updated/reconciled them if needed.  Objective:  Physical Exam Constitutional:      Appearance: Normal appearance.  HENT:     Head:  Normocephalic.  Pulmonary:     Effort: Pulmonary effort is normal.  Neurological:     Mental Status: She is alert and oriented to person, place, and time.  Psychiatric:        Mood and Affect: Mood normal.        Behavior: Behavior normal.     As part of this Video Visit, no in-person exam was conducted. Video interaction permitted the following observations.    The patient reports they are physically located in [place]  and is currently: at home. I conducted a audio/video visit. I spent  29m 52s on the video call with the patient. I spent an additional 5 minutes on pre- and post-visit activities on the date of service .

## 2023-09-20 ENCOUNTER — Telehealth (HOSPITAL_COMMUNITY): Admitting: Psychiatry

## 2023-09-20 ENCOUNTER — Encounter (HOSPITAL_COMMUNITY): Payer: Self-pay | Admitting: Psychiatry

## 2023-09-20 VITALS — Wt 212.0 lb

## 2023-09-20 DIAGNOSIS — F902 Attention-deficit hyperactivity disorder, combined type: Secondary | ICD-10-CM | POA: Diagnosis not present

## 2023-09-20 DIAGNOSIS — F5102 Adjustment insomnia: Secondary | ICD-10-CM

## 2023-09-20 DIAGNOSIS — F319 Bipolar disorder, unspecified: Secondary | ICD-10-CM

## 2023-09-20 DIAGNOSIS — Z638 Other specified problems related to primary support group: Secondary | ICD-10-CM | POA: Diagnosis not present

## 2023-09-20 MED ORDER — TRAZODONE HCL 50 MG PO TABS: 50.0000 mg | ORAL_TABLET | Freq: Every evening | ORAL | 0 refills | Status: DC | PRN

## 2023-09-20 MED ORDER — LAMOTRIGINE 100 MG PO TABS: 100.0000 mg | ORAL_TABLET | Freq: Every day | ORAL | 0 refills | Status: DC

## 2023-09-20 NOTE — Progress Notes (Signed)
 [facility] MD Virtual Progress Note   Patient Location: Home Provider Location: Home Office  I connect with patient by video and verified that I am speaking with correct person by using two identifiers. I discussed the limitations of evaluation and management by telemedicine and the availability of in person appointments. I also discussed with the patient that there may be a patient responsible charge related to this service. The patient expressed understanding and agreed to proceed.  Morgan Oneal [id number] 38 y.o.  09/20/2023 10:55 AM  History of Present Illness:  Patient is evaluated by video session.  She reported a lot of family stress in recent days.  Patient 47 year old daughter ran away from her father's house from last Saturday and find out Tuesday.  Patient told they have to involve the police and they were able to locate her and she was staying in a hotel and her girlfriend support her with food and transportation.  Patient told her grandmother is instigating the situation.  She reported few years ago her grandmother lost that daughter's custody and a court and she was keeping her away until recently allow her to have visitations.  She believes grandmother is behind this and feeding her daughter and when she called the police to find the daughter, her granddaughter got very upset and called the DSS.  Now daughter refused to come back to either her house or her father's house.  She either want to stay at her grandma house or her girlfriend's house.  She reported a lot of stress, anxiety, poor sleep, irritability.  She started seeing therapy with Jon Ada.  She reported job is also very challenging and stressful and she is thinking to take some time off or go to short-term disability because she is not able to function very well.  She also admitted not taking trazodone  every night and sometimes she tried to go to sleep on her own.  She also reported not taking the  Lamictal  because she does not like to take the medicine every day.  Patient has 2 other children who lives with the patient.  Patient told she did not get any help 1 one of her child got sick.  Though she denies any hallucination, paranoia, suicidal thoughts but reported her mood is up-and-down.  She struggle to deal with anxiety.  She gets very fidgety.  She denies any aggressive behavior, suicidal thoughts.  She is not involved in any self abusive behavior.  We have recommended for psychological testing for ADHD but so far it has not done.  Past Psychiatric History: H/O ADHD and given Adderall but no details. H/O cutting herself when parents going through divorce.  No h/o inpatient, suicidal attempt, psychosis, mood swings, highs and lows, anger, irritability.  H/O hevay ETOH.  We tried Abilify  and Strattera  that did not work.   Past Medical History:  Diagnosis Date   Asthma    Depression     Outpatient Encounter Medications as of 09/20/2023  Medication Sig   albuterol  (VENTOLIN  HFA) 108 (90 Base) MCG/ACT inhaler Inhale 2 puffs into the lungs every 6 (six) hours as needed for wheezing or shortness of breath.   aspirin-acetaminophen-caffeine (EXCEDRIN MIGRAINE) 250-250-65 MG tablet Take 1 tablet by mouth every 6 (six) hours as needed for headache.   ipratropium (ATROVENT ) 0.06 % nasal spray Place 2 sprays into both nostrils 4 (four) times daily.   lamoTRIgine  (LAMICTAL ) 100 MG tablet Take 1 tablet (100 mg total) by mouth daily.   omeprazole  (  PRILOSEC) 20 MG capsule Take 1 capsule (20 mg total) by mouth daily.   topiramate (TOPAMAX) 25 MG tablet Take 25 mg by mouth daily.   traZODone  (DESYREL ) 50 MG tablet Take 1 tablet (50 mg total) by mouth at bedtime as needed for sleep. Rarely needed   No facility-administered encounter medications on file as of 09/20/2023.    No results found for this or any previous visit (from the past 2160 hours).   Psychiatric Specialty Exam: Physical Exam  Review  of Systems  Psychiatric/Behavioral:  Positive for dysphoric mood and sleep disturbance.        Stress    Weight 212 lb (96.2 kg).There is no height or weight on file to calculate BMI.  General Appearance: Casual  Eye Contact:  Good  Speech:  Normal Rate  Volume:  Normal  Mood:  Anxious, Irritable, and stress  Affect:  Labile  Thought Process:  Goal Directed  Orientation:  Full (Time, Place, and Person)  Thought Content:  Rumination  Suicidal Thoughts:  No  Homicidal Thoughts:  No  Memory:  Immediate;   Good Recent;   Good Remote;   Good  Judgement:  Fair  Insight:  Shallow  Psychomotor Activity:  Decreased  Concentration:  Concentration: Good and Attention Span: Good  Recall:  Good  Fund of Knowledge:  Good  Language:  Good  Akathisia:  No  Handed:  Right  AIMS (if indicated):     Assets:  Communication Skills Desire for Improvement Housing Transportation  ADL's:  Intact  Cognition:  WNL  Sleep:  fair       03/20/2022   10:34 AM 01/24/2022   11:38 AM 09/20/2021    2:19 PM 08/13/2021    8:19 AM 03/22/2021   10:38 AM  Depression screen PHQ 2/9  Decreased Interest 0 0 0 3 0  Down, Depressed, Hopeless 0 0 0 3 0  PHQ - 2 Score 0 0 0 6 0  Altered sleeping 0   2   Tired, decreased energy 0   3   Change in appetite 0   1   Feeling bad or failure about yourself  0   3   Trouble concentrating 0   3   Moving slowly or fidgety/restless 0   3   Suicidal thoughts 0   0   PHQ-9 Score 0   21   Difficult doing work/chores Not difficult at all   Somewhat difficult     Assessment/Plan: Bipolar I disorder (HCC) - Plan: lamoTRIgine  (LAMICTAL ) 100 MG tablet, traZODone  (DESYREL ) 50 MG tablet  Adjustment insomnia - Plan: traZODone  (DESYREL ) 50 MG tablet  Stress due to family tension  Attention deficit hyperactivity disorder (ADHD), combined type - Plan: lamoTRIgine  (LAMICTAL ) 100 MG tablet  Patient is 38 year old African-American female with history of bipolar disorder,  insomnia, questionable ADHD and now family stress.  She is noncompliant with Lamictal  and not taking trazodone  every night.  Discussed family stress and tension related to 73 year old daughter who ran away and does not want to stay with either patient or her father.  Patient blaming her grandmother for situation.  She also have DSS involved because her grandmother call them.  She is very stressed.  She would like to take time off and wondering if she can go to short-term disability.  She started therapy with Jon Ada.  I discussed in detail that she need to take the medicine every day even though she do not recall any side effects from  the Lamictal  in the past.  Patient told she just do not like taking medication.  Emphasis given about the risk of medication noncompliance and worsening of symptoms.  I also encouraged to have in person visit in the office and she agree with the plan.  Will schedule appointment in person in 3 weeks.  Patient agree that she will take the medication first and if do not get better we will consider time off from the work.  Discussed safety concerns at any time having active suicidal thoughts or homicidal thoughts and she need to call 911 or go to local emergency room.   Follow Up Instructions:     I discussed the assessment and treatment plan with the patient. The patient was provided an opportunity to ask questions and all were answered. The patient agreed with the plan and demonstrated an understanding of the instructions.   The patient was advised to call back or seek an in-person evaluation if the symptoms worsen or if the condition fails to improve as anticipated.    Collaboration of Care: Other provider involved in patient's care AEB notes are available in epic to review  Patient/Guardian was advised Release of Information must be obtained prior to any record release in order to collaborate their care with an outside provider. Patient/Guardian was advised if they  have not already done so to contact the registration department to sign all necessary forms in order for us  to release information regarding their care.   Consent: Patient/Guardian gives verbal consent for treatment and assignment of benefits for services provided during this visit. Patient/Guardian expressed understanding and agreed to proceed.     Total encounter time 28 minutes which includes face-to-face time, chart reviewed, care coordination, order entry and documentation during this encounter.   Note: This document was prepared by [employer] voice dictation technology and any errors that results from this process are unintentional.    Leni ONEIDA Client, MD 09/20/2023

## 2023-10-24 ENCOUNTER — Ambulatory Visit (HOSPITAL_COMMUNITY): Admitting: Psychiatry

## 2023-11-14 ENCOUNTER — Ambulatory Visit (HOSPITAL_COMMUNITY): Admitting: Psychiatry

## 2024-01-09 ENCOUNTER — Encounter (HOSPITAL_COMMUNITY): Payer: Self-pay

## 2024-01-13 ENCOUNTER — Encounter (HOSPITAL_COMMUNITY): Payer: Self-pay | Admitting: Psychiatry

## 2024-01-13 ENCOUNTER — Telehealth (HOSPITAL_COMMUNITY): Admitting: Psychiatry

## 2024-01-13 VITALS — Wt 212.0 lb

## 2024-01-13 DIAGNOSIS — F99 Mental disorder, not otherwise specified: Secondary | ICD-10-CM | POA: Diagnosis not present

## 2024-01-13 DIAGNOSIS — F902 Attention-deficit hyperactivity disorder, combined type: Secondary | ICD-10-CM | POA: Diagnosis not present

## 2024-01-13 DIAGNOSIS — F319 Bipolar disorder, unspecified: Secondary | ICD-10-CM | POA: Diagnosis not present

## 2024-01-13 DIAGNOSIS — F5105 Insomnia due to other mental disorder: Secondary | ICD-10-CM

## 2024-01-13 MED ORDER — LAMOTRIGINE 100 MG PO TABS: 100.0000 mg | ORAL_TABLET | Freq: Every day | ORAL | 1 refills | Status: AC

## 2024-01-13 MED ORDER — TRAZODONE HCL 50 MG PO TABS: 50.0000 mg | ORAL_TABLET | Freq: Every evening | ORAL | 0 refills | Status: AC | PRN

## 2024-01-13 MED ORDER — ATOMOXETINE HCL 40 MG PO CAPS: 40.0000 mg | ORAL_CAPSULE | Freq: Every day | ORAL | 1 refills | Status: AC

## 2024-01-13 NOTE — Progress Notes (Signed)
 " [facility] MD Virtual Progress Note   Patient Location: Home Provider Location: Home Office  I connect with patient by video and verified that I am speaking with correct person by using two identifiers. I discussed the limitations of evaluation and management by telemedicine and the availability of in person appointments. I also discussed with the patient that there may be a patient responsible charge related to this service. The patient expressed understanding and agreed to proceed.  Morgan Oneal [id number] 39 y.o.  01/13/2024 8:26 AM  History of Present Illness:  Patient evaluated by video session.  She required Christmas and holidays.  Since taking the Lamictal  every day she noticed mood is much better and denies any mania psychosis or irritability.  Her 40 year old daughter is now staying at her grandma's house and patient try very limited to contact her.  Patient does not talk to her grandma but she is very close to her mother who lives at patient's grandma place.  She get all the information about her 40 year old daughter who she reported doing well.  Her 40 year old and 40 year old daughter lives with the patient.  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  She admitted struggle with attention concentration multitasking and time management.  Patient like to have a letter for test accommodation and like to consider taking again Strattera .  In the past she took Strattera  alone that did not help as much but now she is taking Lamictal  every day she is wondering if together it may work.  Her mood symptoms are much better but ADHD still issue.  We have recommended ADHD testing but patient never make the appointment.  She sleeps okay and occasionally take trazodone .  She has no rash itching tremor or shakes or any EPS.  She is also excited as starting a new job at [employer] with better hours, no calls and weekends.   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  She is in therapy with Jon Ada.  Patient denies drinking or using any illegal substances.  She is trying to focus on her general health and started 21 days fasting.  She last few pounds and today is her seventh fasting day.  She has no tremors, shakes.  She do not recall any side effects of Strattera  in the past other than it did not work.   Past Psychiatric History: H/O ADHD and given Adderall but no details. H/O cutting herself when parents going through divorce.  No h/o inpatient, suicidal attempt, psychosis, mood swings, highs and lows, anger, irritability.  H/O hevay ETOH.  We tried Abilify  and Strattera  that did not work.   Past Medical History:  Diagnosis Date   Asthma    Depression     Outpatient Encounter Medications as of 01/13/2024  Medication Sig   albuterol  (VENTOLIN  HFA) 108 (90 Base) MCG/ACT inhaler Inhale 2 puffs into the lungs every 6 (six) hours as needed for wheezing or shortness of breath.   aspirin-acetaminophen-caffeine (EXCEDRIN MIGRAINE) 250-250-65 MG tablet Take 1 tablet by mouth every 6 (six) hours as needed for headache.   ipratropium (ATROVENT ) 0.06 % nasal spray Place 2 sprays into both nostrils 4 (four) times daily.   lamoTRIgine  (LAMICTAL ) 100 MG tablet Take 1 tablet (100 mg total) by mouth daily.   omeprazole  (PRILOSEC) 20 MG capsule Take 1 capsule (20 mg total) by mouth daily.   topiramate (TOPAMAX) 25 MG  tablet Take 25 mg by mouth daily.   traZODone  (DESYREL ) 50 MG tablet Take 1 tablet (50 mg total) by mouth at bedtime as needed for sleep. Rarely needed   No facility-administered encounter medications on file as of 01/13/2024.    No results found for this or any previous visit (from the past 2160 hours).   Psychiatric Specialty Exam: Physical Exam  Review of Systems  Psychiatric/Behavioral:  Positive for decreased concentration.     Weight 212 lb (96.2 kg).There is no height or weight on file to calculate  BMI.  General Appearance: Casual  Eye Contact:  Good  Speech:  Normal Rate  Volume:  Normal  Mood:  Anxious  Affect:  Congruent  Thought Process:  Goal Directed  Orientation:  Full (Time, Place, and Person)  Thought Content:  WDL  Suicidal Thoughts:  No  Homicidal Thoughts:  No  Memory:  Immediate;   Good Recent;   Good Remote;   Good  Judgement:  Fair  Insight:  Shallow  Psychomotor Activity:  Normal  Concentration:  Concentration: Good and Attention Span: Good  Recall:  Good  Fund of Knowledge:  Good  Language:  Good  Akathisia:  No  Handed:  Right  AIMS (if indicated):     Assets:  Communication Skills Desire for Improvement Housing Transportation  ADL's:  Intact  Cognition:  WNL  Sleep:  fair       03/20/2022   10:34 AM 01/24/2022   11:38 AM 09/20/2021    2:19 PM 08/13/2021    8:19 AM 03/22/2021   10:38 AM  Depression screen PHQ 2/9  Decreased Interest 0 0 0 3 0  Down, Depressed, Hopeless 0 0 0 3 0  PHQ - 2 Score 0 0 0 6 0  Altered sleeping 0   2   Tired, decreased energy 0   3   Change in appetite 0   1   Feeling bad or failure about yourself  0   3   Trouble concentrating 0   3   Moving slowly or fidgety/restless 0   3   Suicidal thoughts 0   0   PHQ-9 Score 0    21    Difficult doing work/chores Not difficult at all   Somewhat difficult      Data saved with a previous flowsheet row definition    Assessment/Plan: Bipolar I disorder (HCC) - Plan: lamoTRIgine  (LAMICTAL ) 100 MG tablet, traZODone  (DESYREL ) 50 MG tablet  Attention deficit hyperactivity disorder (ADHD), combined type - Plan: lamoTRIgine  (LAMICTAL ) 100 MG tablet, atomoxetine  (STRATTERA ) 40 MG capsule  Insomnia due to other mental disorder - Plan: traZODone  (DESYREL ) 50 MG tablet  Patient is 39 year old African-American female with history of bipolar disorder, insomnia, questionable ADHD and insomnia.  Patient like to go back on Strattera  which alone did not help.  She took up to 60 mg.  Now  she is taking Lamictal  every day and wondering if she can take it with the Lamictal  to help her focus attention, multitasking and time management.  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  Her plan is to take the exam and second week of March and she need test accommodation letter.  She did like Lamictal  since taking every day and do not have significant highs or lows or any anger issues.  She still taking trazodone  on and off which helps her sleep.  Will try Strattera  40 mg daily.  In the past  she took up to 60 mg.  Will provide a letter for test accommodation.  Continue Lamictal  100 mg daily and trazodone  50 mg as needed.  Encouraged to keep appointment with Jon Ada.  Recommend to call back if she has any question concern or if she feels worsening of symptoms.  Follow-up in 2 months.   Follow Up Instructions:     I discussed the assessment and treatment plan with the patient. The patient was provided an opportunity to ask questions and all were answered. The patient agreed with the plan and demonstrated an understanding of the instructions.   The patient was advised to call back or seek an in-person evaluation if the symptoms worsen or if the condition fails to improve as anticipated.    Collaboration of Care: Other provider involved in patient's care AEB notes are available in epic to review  Patient/Guardian was advised Release of Information must be obtained prior to any record release in order to collaborate their care with an outside provider. Patient/Guardian was advised if they have not already done so to contact the registration department to sign all necessary forms in order for us  to release information regarding their care.   Consent: Patient/Guardian gives verbal consent for treatment and assignment of benefits for services provided during this visit. Patient/Guardian expressed understanding and agreed to proceed.     Total encounter time 29  minutes which includes face-to-face time, chart reviewed, care coordination, order entry and documentation during this encounter.   Note: This document was prepared by [employer] voice dictation technology and any errors that results from this process are unintentional.    Leni ONEIDA Client, MD 01/13/2024   "

## 2024-01-14 ENCOUNTER — Telehealth (HOSPITAL_COMMUNITY): Payer: Self-pay | Admitting: *Deleted

## 2024-01-14 NOTE — Telephone Encounter (Signed)
 School accomodation letter completed and scanned to patient email after speaking with patient.

## 2024-01-14 NOTE — Telephone Encounter (Signed)
 Thanks

## 2024-01-20 ENCOUNTER — Encounter (HOSPITAL_COMMUNITY): Payer: Self-pay

## 2024-01-21 NOTE — Telephone Encounter (Signed)
 What is the status?  Did patient received ?  If not then please move forward and provide letter to the patient.  Thank you

## 2024-02-05 ENCOUNTER — Other Ambulatory Visit (HOSPITAL_COMMUNITY): Payer: Self-pay | Admitting: Psychiatry

## 2024-02-05 DIAGNOSIS — F5105 Insomnia due to other mental disorder: Secondary | ICD-10-CM

## 2024-02-05 DIAGNOSIS — F902 Attention-deficit hyperactivity disorder, combined type: Secondary | ICD-10-CM

## 2024-02-05 DIAGNOSIS — F319 Bipolar disorder, unspecified: Secondary | ICD-10-CM

## 2024-03-09 ENCOUNTER — Telehealth (HOSPITAL_COMMUNITY): Admitting: Psychiatry
